# Patient Record
Sex: Male | Born: 1990 | Race: White | Hispanic: No | Marital: Single | Smoking: Never smoker
Health system: Southern US, Community
[De-identification: ages and names within clinical notes are randomized; demographics above are authoritative.]

## PROBLEM LIST (undated history)

## (undated) DIAGNOSIS — F988 Other specified behavioral and emotional disorders with onset usually occurring in childhood and adolescence: Secondary | ICD-10-CM

## (undated) DIAGNOSIS — N62 Hypertrophy of breast: Secondary | ICD-10-CM

## (undated) DIAGNOSIS — F319 Bipolar disorder, unspecified: Secondary | ICD-10-CM

## (undated) DIAGNOSIS — M109 Gout, unspecified: Secondary | ICD-10-CM

## (undated) DIAGNOSIS — M249 Joint derangement, unspecified: Secondary | ICD-10-CM

## (undated) DIAGNOSIS — I7300 Raynaud's syndrome without gangrene: Secondary | ICD-10-CM

## (undated) HISTORY — DX: Joint derangement, unspecified: M24.9

## (undated) HISTORY — DX: Bipolar disorder, unspecified: F31.9

## (undated) HISTORY — PX: WISDOM TOOTH EXTRACTION: SHX21

## (undated) HISTORY — DX: Hypertrophy of breast: N62

## (undated) HISTORY — DX: Other specified behavioral and emotional disorders with onset usually occurring in childhood and adolescence: F98.8

## (undated) HISTORY — DX: Raynaud's syndrome without gangrene: I73.00

## (undated) HISTORY — DX: Gout, unspecified: M10.9

---
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
## 2010-04-01 ENCOUNTER — Encounter: Payer: Self-pay | Admitting: Family Medicine

## 2010-07-27 NOTE — Op Note (Signed)
NAMECARNELL, BEAVERS                 ACCOUNT NO.:  [id number]   MEDICAL RECORD NO.:  [id number]          PATIENT TYPE:  AMB   LOCATION:  DSC                          FACILITY:  [facility]   PHYSICIAN:  Loreta Ave, M.D. DATE OF BIRTH:  September 18, 1990   DATE OF PROCEDURE:  DATE OF DISCHARGE:                                 OPERATIVE REPORT   PREOPERATIVE DIAGNOSIS:  Medial meniscus tear, left knee.   POSTOPERATIVE DIAGNOSES:  1. Medial meniscus tear, left knee.  2. Irreparable complex bucket-handle tear medial meniscus.  3. 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   PROCEDURE:  1. Left knee examined under anesthesia.  2. Arthroscopy.  3. Partial medial meniscectomy.  4. Chondroplasty medial margin of patella.   SURGEON:  Loreta Ave, M.D.   ASSISTANT:  Genene Churn. Denton Meek.   ANESTHESIA:  General.   BLOOD LOSS:  Minimal.   TOURNIQUET:  Not employed.   SPECIMENS:  None.   CULTURES:  None.   COMPLICATIONS:  None.   DRESSING:  Soft compressive.   PROCEDURE:  The patient brought to the operating room and after adequate  anesthesia had been obtained, the knee was examined.  Lacks full extension  by a 5 degrees, soft, flexion contracture consistent with his displaced  bucket-handle tear.  Otherwise, full motion stable ligaments.  Tourniquet  leg holder applied.  Left leg prepped and draped in the usual sterile  fashion.  Three portals created, one superolateral, one each medial and  lateral parapatellar.  Inflow catheter introduced and the knee extended.  Arthroscope introduced, knee inspected.  Displaced bucket-handle tear,  posterior two-thirds medial meniscus displaced forward.  This is away from  the rim so it was white-white.  This was also subacute so that there was  marked intrameniscal tearing.  Thoroughly assessed and repair really not an  option.  This was locked into the notch and I could barely even get it out  of the notch.  After thoroughly  assessing this, the bucket-handle tear was  removed, tapering __________, leaving about 1/3 of the meniscus all the  around to completion.  Articular cartilage looked good.  Cruciate ligaments  intact.  Lateral meniscus and lateral compartment normal.  On the patella  there was a flap, grade 2-3, right at the medial margin, traumatic in  origin, debrided.  Remaining articular cartilage and patellofemoral tracking  look good.  At completion, the entire knee examined, no other findings  appreciated.  Instruments and fluid removed.  Portals in knee injected with  Marcaine.  Portals closed with 4-0 nylon.  Sterile compressed dressing  applied.  Anesthesia reversed.  Brought to the recovery room.  Tolerated  surgery well, no complications.      Loreta Ave, M.D.  Electronically Signed     DFM/MEDQ  D:  11/27/2005  T:  11/27/2005  Job:  [id number]

## 2010-07-31 ENCOUNTER — Encounter: Payer: Self-pay | Admitting: *Deleted

## 2010-11-07 ENCOUNTER — Other Ambulatory Visit: Payer: Self-pay | Admitting: Family Medicine

## 2010-11-07 DIAGNOSIS — N63 Unspecified lump in unspecified breast: Secondary | ICD-10-CM

## 2010-11-07 DIAGNOSIS — N644 Mastodynia: Secondary | ICD-10-CM

## 2010-11-14 ENCOUNTER — Ambulatory Visit
Admission: RE | Admit: 2010-11-14 | Discharge: 2010-11-14 | Disposition: A | Discharge status: Home / Self Care | Payer: BC Managed Care – PPO | Source: Ambulatory Visit | Attending: Family Medicine | Admitting: Family Medicine

## 2010-11-14 ENCOUNTER — Other Ambulatory Visit: Payer: Self-pay

## 2010-11-14 DIAGNOSIS — N63 Unspecified lump in unspecified breast: Secondary | ICD-10-CM

## 2010-11-14 DIAGNOSIS — N644 Mastodynia: Secondary | ICD-10-CM

## 2012-03-31 ENCOUNTER — Other Ambulatory Visit: Payer: Self-pay | Admitting: Family Medicine

## 2012-03-31 DIAGNOSIS — N6310 Unspecified lump in the right breast, unspecified quadrant: Secondary | ICD-10-CM

## 2012-04-03 ENCOUNTER — Ambulatory Visit
Admission: RE | Admit: 2012-04-03 | Discharge: 2012-04-03 | Disposition: A | Discharge status: Home / Self Care | Payer: BC Managed Care – PPO | Source: Ambulatory Visit | Attending: Family Medicine | Admitting: Family Medicine

## 2012-04-03 DIAGNOSIS — N6310 Unspecified lump in the right breast, unspecified quadrant: Secondary | ICD-10-CM

## 2012-04-17 ENCOUNTER — Ambulatory Visit (INDEPENDENT_AMBULATORY_CARE_PROVIDER_SITE_OTHER): Payer: BC Managed Care – PPO | Admitting: Surgery

## 2012-04-17 ENCOUNTER — Encounter (INDEPENDENT_AMBULATORY_CARE_PROVIDER_SITE_OTHER): Payer: Self-pay | Admitting: Surgery

## 2012-04-17 VITALS — BP 126/74 | HR 70 | Temp 97.3°F | Resp 18 | Ht 68.0 in | Wt 172.5 lb

## 2012-04-17 DIAGNOSIS — N62 Hypertrophy of breast: Secondary | ICD-10-CM | POA: Insufficient documentation

## 2012-04-17 NOTE — Progress Notes (Signed)
Patient ID: Charles [name], male   DOB: Jul 12, 1990, 22 y.o.   MRN: [id number]  Chief Complaint  Patient presents with  . New Evaluation    Gynecomastia    HPI Rawley [name] is a 22 y.o. male.  Self-referred for gynecomastia PCP - Dr. Rudi Heap HPI This is a 22 year old male who presents with a very tender enlarging right breast mass. The patient has had some gynecomastia for the last couple of years. He underwent ultrasound last year that showed only benign gynecomastia. He states that both sides have become larger but the right side is larger than the left. The right side has also become fairly tender to palpation. The there is a palpable mass within the right breast tissue that is exquisitely tender. He feels some firm masses in the left side as well. He had a repeat ultrasound that was unable to visualize this right breast mass. He denies any headaches or visual changes. No change in the size of his testicles. No other systemic complaints. All of his current medications were started after the gynecomastia was first noticed.  Past Medical History  Diagnosis Date  . Allergic rhinitis   . Gynecomastia, male     Past Surgical History  Procedure Date  . Tonsillectomy 09/16/95  . Knee arthroscopy w/ meniscectomy 11/23/05    left knee - extensive bucket handle tear of medial meniscus  . Shoulder arthroscopy w/ bankhart procedure 08/10/06    Family History  Problem Relation Age of Onset  . Heart attack Maternal Grandfather   . Diabetes Paternal Grandmother   . Diabetes Paternal Grandfather   . Hyperlipidemia Paternal Grandfather   . Hypertension Paternal Grandfather   . Heart disease Paternal Grandfather   . Cancer Maternal Aunt     breast    Social History History  Substance Use Topics  . Smoking status: Never Smoker   . Smokeless tobacco: Never Used  . Alcohol Use: Yes     Comment: occasional    No Known Allergies  Current Outpatient Prescriptions  Medication Sig Dispense  Refill  . amphetamine-dextroamphetamine (ADDERALL) 5 MG tablet Take 5 mg by mouth daily.      Marland Kitchen atomoxetine (STRATTERA) 100 MG capsule Take 100 mg by mouth daily.      . divalproex (DEPAKOTE) 500 MG DR tablet Take 500 mg by mouth daily. Patient unsure of dosage.      . valACYclovir (VALTREX) 1000 MG tablet Take 1,000 mg by mouth 2 (two) times daily. Prn as needed for fever blisters         Review of Systems Review of Systems  Constitutional: Negative for fever, chills and unexpected weight change.  HENT: Negative for hearing loss, congestion, sore throat, trouble swallowing and voice change.   Eyes: Negative for visual disturbance.  Respiratory: Negative for cough and wheezing.   Cardiovascular: Negative for chest pain, palpitations and leg swelling.  Gastrointestinal: Negative for nausea, vomiting, abdominal pain, diarrhea, constipation, blood in stool, abdominal distention, anal bleeding and rectal pain.  Genitourinary: Negative for hematuria and difficulty urinating.  Musculoskeletal: Negative for arthralgias.  Skin: Negative for rash and wound.  Neurological: Negative for seizures, syncope, weakness and headaches.  Hematological: Negative for adenopathy. Does not bruise/bleed easily.  Psychiatric/Behavioral: Negative for confusion.  Painful chest mass  Blood pressure 126/74, pulse 70, temperature 97.3 F (36.3 C), temperature source Temporal, resp. rate 18, height 5\' 8"  (1.727 m), weight 172 lb 8 oz (78.245 kg).  Physical Exam Physical Exam WDWN in NAD HEENT:  EOMI, sclera anicteric Neck:  No masses, no thyromegaly Lungs:  CTA bilaterally; normal respiratory effort Chest:  Bilateral gynecomastia - right greater than left; firm 1 cm mass lateral to right nipple - very tender to palpation.  Small subcm mass lateral to left nipple.  No lymphadenopathy CV:  Regular rate and rhythm; no murmurs Abd:  +bowel sounds, soft, non-tender, no masses Ext:  Well-perfused; no edema Skin:   Warm, dry; no sign of jaundice  Data Reviewed Clinical Data: Increasing size of right breast lump and increasing  tenderness.  RIGHT BREAST ULTRASOUND  Comparison: 11/14/2010  On physical exam, I palpate glandular tissue centered in the  subareolar region of the breast, right greater than left. No  discrete mass is palpable today.  Findings: Ultrasound is performed, showing breast tissue centered  in the subareolar region of the right breast now extending to the  11 o'clock position, 2 cm the nipple, increased from the prior  exam. No suspicious mass is seen today.  IMPRESSION:  Gynecomastia, right breast.  RECOMMENDATION:  No further imaging is warranted at this time.  I have discussed the findings and recommendations with the patient.  Results were also provided in writing at the conclusion of the  visit.  BI-RADS CATEGORY 2: Benign finding(s).  Original Report Authenticated By: Vincenza Hews, M.D.   Assessment    Bilateral gynecomastia Painful enlarging firm right breast mass Firm enlarging left breast masses    Plan    Recommend bilateral subcutaneous mastecomies.  Options include further imaging, including core needle biopsy, but given the exquisite tenderness, would recommend proceeding with surgery.  The surgical procedure has been discussed with the patient.  Potential risks, benefits, alternative treatments, and expected outcomes have been explained.  All of the patient's questions at this time have been answered.  The likelihood of reaching the patient's treatment goal is good.  The patient understand the proposed surgical procedure and wishes to proceed.        Elisandra Deshmukh K. 04/17/2012, 12:52 PM

## 2012-04-17 NOTE — Patient Instructions (Addendum)
Blood work today.  We will go ahead and schedule your surgery.  We will call you with the lab results.  If the labs are normal, we will proceed with surgery.

## 2012-04-21 ENCOUNTER — Telehealth (INDEPENDENT_AMBULATORY_CARE_PROVIDER_SITE_OTHER): Payer: Self-pay | Admitting: General Surgery

## 2012-04-21 ENCOUNTER — Other Ambulatory Visit (INDEPENDENT_AMBULATORY_CARE_PROVIDER_SITE_OTHER): Payer: Self-pay | Admitting: Surgery

## 2012-04-21 LAB — TESTOSTERONE, FREE, TOTAL, SHBG
Sex Hormone Binding: 22 nmol/L (ref 13–71)
Testosterone, Free: 65.5 pg/mL (ref 47.0–244.0)
Testosterone-% Free: 2.4 % (ref 1.6–2.9)
Testosterone: 271 ng/dL — ABNORMAL LOW (ref 300–890)

## 2012-04-21 LAB — BASIC METABOLIC PANEL
BUN: 9 mg/dL (ref 6–23)
CO2: 27 mEq/L (ref 19–32)
Calcium: 9.9 mg/dL (ref 8.4–10.5)
Chloride: 105 mEq/L (ref 96–112)
Creat: 0.95 mg/dL (ref 0.50–1.35)
Glucose, Bld: 89 mg/dL (ref 70–99)
Potassium: 4 mEq/L (ref 3.5–5.3)
Sodium: 142 mEq/L (ref 135–145)

## 2012-04-21 LAB — PROLACTIN: Prolactin: 7.9 ng/mL (ref 2.1–17.1)

## 2012-04-21 LAB — TSH: TSH: 2.428 u[IU]/mL (ref 0.350–4.500)

## 2012-04-21 NOTE — Telephone Encounter (Signed)
LMOM for Charles [name] or Charles [name] to call back and ask for [employer]

## 2012-04-21 NOTE — Telephone Encounter (Signed)
Patient called back and I told him about his lab work and that he will need to call Dr Rudi Heap at [facility] to do more for low testosterone, I also told the patient that I will be faxing over his lab work to Dr Christell Constant and I made him a apt to come back in to see Dr Corliss Skains on 07-10-12 @ 9:00. Patient is aware that I will mail out apt card

## 2012-07-10 ENCOUNTER — Ambulatory Visit (INDEPENDENT_AMBULATORY_CARE_PROVIDER_SITE_OTHER): Payer: BC Managed Care – PPO | Admitting: Surgery

## 2012-07-10 ENCOUNTER — Encounter (INDEPENDENT_AMBULATORY_CARE_PROVIDER_SITE_OTHER): Payer: Self-pay | Admitting: Surgery

## 2012-07-10 VITALS — BP 122/70 | HR 68 | Temp 97.3°F | Resp 14 | Ht 68.0 in | Wt 173.0 lb

## 2012-07-10 DIAGNOSIS — N62 Hypertrophy of breast: Secondary | ICD-10-CM

## 2012-07-10 NOTE — Patient Instructions (Addendum)
If you decide to have surgery, please call my nurse Pattricia Boss at [phone] to let me know your decision.  I will then do the paperwork and our surgery schedulers will call you to schedule surgery.

## 2012-07-10 NOTE — Progress Notes (Signed)
Followup of his bilateral gynecomastia. The patient has not yet started any testosterone supplements. He has had repeat labs which shows a low testosterone level. He states that the right chest feels more tender and slightly larger. He feels that there may be a new mass that is growing. He states that he would prefer to have the surgery to go ahead and take care of this problem.  Filed Vitals:   07/10/12 0950  BP: 122/70  Pulse: 68  Temp: 97.3 F (36.3 C)  Resp: 14   The right breast tissue seems more prominent on the left. Both sides are mildly tender. No sign of infection or inflammation. The right breast tissue but seems to be slightly larger than previous visit. There some firmness just lateral to the upper edge of the nipple. The left breast does show some palpable protruding breast tissue but no hard masses. No sign of infection or inflammation.  Impression bilateral gynecomastia with slightly decreased testosterone level  Plan: I would be willing to perform bilateral subcutaneous mastectomies on this patient. He is having discussions with his mother as well as with his primary care physician regarding testosterone levels and possible testosterone supplementation. One day have discussed this and have come to the decision for surgery will let us know we will call him to schedule surgery. I did discuss the procedure with the patient and he understands that this would be an outpatient surgery but that he would have drains on each side to prevent seroma formation.  Wilmon Arms. Corliss Skains, MD, [facility]  07/10/2012 10:45 AM

## 2012-08-26 ENCOUNTER — Encounter: Payer: Self-pay | Admitting: Physician Assistant

## 2012-08-26 ENCOUNTER — Ambulatory Visit (INDEPENDENT_AMBULATORY_CARE_PROVIDER_SITE_OTHER): Payer: 59 | Admitting: Physician Assistant

## 2012-08-26 VITALS — BP 125/80 | HR 63 | Temp 97.8°F | Ht 68.0 in | Wt 175.0 lb

## 2012-08-26 DIAGNOSIS — J329 Chronic sinusitis, unspecified: Secondary | ICD-10-CM

## 2012-08-26 MED ORDER — AMOXICILLIN-POT CLAVULANATE 875-125 MG PO TABS: 1.0000 | ORAL_TABLET | Freq: Two times a day (BID) | ORAL | Status: DC

## 2012-08-26 NOTE — Patient Instructions (Signed)

## 2012-08-26 NOTE — Progress Notes (Signed)
Subjective:     Patient ID: Charles [name], male   DOB: 07/14/1990, 22 y.o.   MRN: [id number]  HPI Pt with several week hx of head congestin with pain, PND, general malaise, and fatigue Hx of intermit sinusitis in the past No fever/chills + nausea but no vomiting No OTC meds tried  Review of Systems  All other systems reviewed and are negative.       Objective:   Physical Exam  Nursing note and vitals reviewed. + sinus TTP- maxillary/frontal Ears- canals nl bilat, + fluid behind TM's Oral- + PND, no increase in tonsil size No cerv nodes Heart- RRR w/o M Lungs- CTA     Assessment:     1. Sinusitis, chronic        Plan:     Augmentin rx OTC antihist/decongest Fluids Rest F/U prn

## 2012-10-12 ENCOUNTER — Ambulatory Visit (INDEPENDENT_AMBULATORY_CARE_PROVIDER_SITE_OTHER): Payer: 59 | Admitting: General Practice

## 2012-10-12 ENCOUNTER — Encounter: Payer: Self-pay | Admitting: General Practice

## 2012-10-12 VITALS — BP 138/75 | HR 65 | Temp 98.1°F | Ht 67.5 in | Wt 180.0 lb

## 2012-10-12 DIAGNOSIS — N62 Hypertrophy of breast: Secondary | ICD-10-CM

## 2012-10-13 ENCOUNTER — Ambulatory Visit: Payer: 59 | Admitting: General Practice

## 2012-10-13 NOTE — Progress Notes (Signed)
  Subjective:    Patient ID: Charles [name], male    DOB: 07-19-1990, 22 y.o.   MRN: [id number]  HPI Patient presents today for college physical and to discuss gynecomastia. He is accompanied by his mother. Patient reports his breast tissue has increased in size over past two years. His mother reports they have spoken with a Careers adviser and discussed the surgical options. They would now like to have a referral to see an endocrinologist to discuss options. 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 He and his mother denies use of medications in the past that cause this condition. He reports taking adderall and Strattera for past two years.     Review of Systems  Constitutional: Negative for fever and chills.  HENT: Negative for congestion, neck pain and neck stiffness.   Eyes: Negative for pain.  Respiratory: Negative for chest tightness and shortness of breath.   Cardiovascular: Negative for chest pain and palpitations.  Gastrointestinal: Negative for abdominal pain, constipation, blood in stool and rectal pain.  Genitourinary: Negative for dysuria and difficulty urinating.  Musculoskeletal: Negative for back pain.  Neurological: Negative for dizziness, weakness and headaches.       Objective:   Physical Exam  Constitutional: He is oriented to person, place, and time. He appears well-developed and well-nourished.  HENT:  Head: Normocephalic and atraumatic.  Right Ear: External ear normal.  Left Ear: External ear normal.  Mouth/Throat: Oropharynx is clear and moist.  Eyes: Conjunctivae and EOM are normal. Pupils are equal, round, and reactive to light.  Neck: Normal range of motion. Neck supple.  Cardiovascular: Normal rate, regular rhythm and normal heart sounds.   Pulmonary/Chest: Effort normal and breath sounds normal. No respiratory distress. He exhibits no tenderness.  Increase size of breast tissue  Abdominal: Soft. Bowel sounds  are normal. He exhibits no distension. There is no tenderness.  Neurological: He is alert and oriented to person, place, and time.  Skin: Skin is warm and dry.  Psychiatric: He has a normal mood and affect.          Assessment & Plan:  1. Gynecomastia, male - Ambulatory referral to Endocrinology -RTO if symptoms worsen  -Patient verbalized understanding -Coralie Keens, FNP-C

## 2012-10-21 ENCOUNTER — Other Ambulatory Visit: Payer: Self-pay | Admitting: Endocrinology

## 2012-10-21 DIAGNOSIS — E291 Testicular hypofunction: Secondary | ICD-10-CM

## 2012-10-22 ENCOUNTER — Ambulatory Visit
Admission: RE | Admit: 2012-10-22 | Discharge: 2012-10-22 | Disposition: A | Discharge status: Home / Self Care | Payer: 59 | Source: Ambulatory Visit | Attending: Endocrinology | Admitting: Endocrinology

## 2012-10-22 DIAGNOSIS — E291 Testicular hypofunction: Secondary | ICD-10-CM

## 2012-10-22 MED ORDER — GADOBENATE DIMEGLUMINE 529 MG/ML IV SOLN
10.0000 mL | Freq: Once | INTRAVENOUS | Status: AC | PRN
  Administered 2012-10-22: 10 mL via INTRAVENOUS

## 2012-12-28 ENCOUNTER — Ambulatory Visit (INDEPENDENT_AMBULATORY_CARE_PROVIDER_SITE_OTHER): Payer: 59 | Admitting: *Deleted

## 2012-12-31 DIAGNOSIS — Z23 Encounter for immunization: Secondary | ICD-10-CM

## 2013-05-25 ENCOUNTER — Encounter: Payer: Self-pay | Admitting: Family Medicine

## 2013-05-25 ENCOUNTER — Ambulatory Visit (INDEPENDENT_AMBULATORY_CARE_PROVIDER_SITE_OTHER): Payer: 59 | Admitting: Family Medicine

## 2013-05-25 VITALS — BP 136/79 | HR 98 | Temp 98.4°F | Ht 68.0 in | Wt 194.4 lb

## 2013-05-25 DIAGNOSIS — L0292 Furuncle, unspecified: Secondary | ICD-10-CM

## 2013-05-25 DIAGNOSIS — L0293 Carbuncle, unspecified: Secondary | ICD-10-CM

## 2013-05-25 MED ORDER — AMOXICILLIN 875 MG PO TABS: 875.0000 mg | ORAL_TABLET | Freq: Two times a day (BID) | ORAL | Status: DC

## 2013-05-25 NOTE — Progress Notes (Signed)
   Subjective:    Patient ID: Charles [name], male    DOB: [birth year], 23 y.o.   MRN: [id number]  HPI  C/o cyst on base of penis.  Denies any risky sex  Review of Systems    No chest pain, SOB, HA, dizziness, vision change, N/V, diarrhea, constipation, dysuria, urinary urgency or frequency, myalgias, arthralgias or rash.  Objective:   Physical Exam  Vital signs noted  Well developed well nourished male.  HEENT - Head atraumatic Normocephalic                Eyes - PERRLA, Conjuctiva - clear Sclera- Clear EOMI Respiratory - Lungs CTA bilateral Cardiac - RRR S1 and S2 without murmur GU - Base of penis with non fluctuant furuncle.  He has what Appears to be acne scars on the shaft of his penis.      Assessment & Plan:  Carbuncle - Plan: amoxicillin (AMOXIL) 875 MG tablet One po bid x 10 days.  Follow up if not better.  Lysbeth Penner FNP

## 2014-02-07 ENCOUNTER — Ambulatory Visit (INDEPENDENT_AMBULATORY_CARE_PROVIDER_SITE_OTHER): Payer: 59 | Admitting: *Deleted

## 2014-02-07 DIAGNOSIS — Z23 Encounter for immunization: Secondary | ICD-10-CM

## 2014-02-16 ENCOUNTER — Other Ambulatory Visit: Payer: Self-pay | Admitting: Endocrinology

## 2014-02-16 DIAGNOSIS — D352 Benign neoplasm of pituitary gland: Secondary | ICD-10-CM

## 2014-03-01 ENCOUNTER — Encounter: Payer: Self-pay | Admitting: Nurse Practitioner

## 2014-03-01 ENCOUNTER — Ambulatory Visit (INDEPENDENT_AMBULATORY_CARE_PROVIDER_SITE_OTHER): Payer: 59 | Admitting: Nurse Practitioner

## 2014-03-01 ENCOUNTER — Encounter (INDEPENDENT_AMBULATORY_CARE_PROVIDER_SITE_OTHER): Payer: Self-pay

## 2014-03-01 VITALS — BP 125/86 | HR 70 | Temp 97.6°F | Ht 68.0 in | Wt 197.0 lb

## 2014-03-01 DIAGNOSIS — F902 Attention-deficit hyperactivity disorder, combined type: Secondary | ICD-10-CM

## 2014-03-01 DIAGNOSIS — L309 Dermatitis, unspecified: Secondary | ICD-10-CM

## 2014-03-01 MED ORDER — AMPHETAMINE-DEXTROAMPHETAMINE 5 MG PO TABS: 5.0000 mg | ORAL_TABLET | Freq: Every day | ORAL | Status: DC

## 2014-03-01 MED ORDER — CLOTRIMAZOLE-BETAMETHASONE 1-0.05 % EX CREA: 1.0000 "application " | TOPICAL_CREAM | Freq: Two times a day (BID) | CUTANEOUS | Status: DC

## 2014-03-01 NOTE — Patient Instructions (Signed)
Eczema Eczema, also called atopic dermatitis, is a skin disorder that causes inflammation of the skin. It causes a red rash and dry, scaly skin. The skin becomes very itchy. Eczema is generally worse during the cooler winter months and often improves with the warmth of summer. Eczema usually starts showing signs in infancy. Some children outgrow eczema, but it may last through adulthood.  CAUSES  The exact cause of eczema is not known, but it appears to run in families. People with eczema often have a family history of eczema, allergies, asthma, or hay fever. Eczema is not contagious. Flare-ups of the condition may be caused by:   Contact with something you are sensitive or allergic to.   Stress. SIGNS AND SYMPTOMS  Dry, scaly skin.   Red, itchy rash.   Itchiness. This may occur before the skin rash and may be very intense.  DIAGNOSIS  The diagnosis of eczema is usually made based on symptoms and medical history. TREATMENT  Eczema cannot be cured, but symptoms usually can be controlled with treatment and other strategies. A treatment plan might include:  Controlling the itching and scratching.   Use over-the-counter antihistamines as directed for itching. This is especially useful at night when the itching tends to be worse.   Use over-the-counter steroid creams as directed for itching.   Avoid scratching. Scratching makes the rash and itching worse. It may also result in a skin infection (impetigo) due to a break in the skin caused by scratching.   Keeping the skin well moisturized with creams every day. This will seal in moisture and help prevent dryness. Lotions that contain alcohol and water should be avoided because they can dry the skin.   Limiting exposure to things that you are sensitive or allergic to (allergens).   Recognizing situations that cause stress.   Developing a plan to manage stress.  HOME CARE INSTRUCTIONS   Only take over-the-counter or  prescription medicines as directed by your health care provider.   Do not use anything on the skin without checking with your health care provider.   Keep baths or showers short (5 minutes) in warm (not hot) water. Use mild cleansers for bathing. These should be unscented. You may add nonperfumed bath oil to the bath water. It is best to avoid soap and bubble bath.   Immediately after a bath or shower, when the skin is still damp, apply a moisturizing ointment to the entire body. This ointment should be a petroleum ointment. This will seal in moisture and help prevent dryness. The thicker the ointment, the better. These should be unscented.   Keep fingernails cut short. Children with eczema may need to wear soft gloves or mittens at night after applying an ointment.   Dress in clothes made of cotton or cotton blends. Dress lightly, because heat increases itching.   A child with eczema should stay away from anyone with fever blisters or cold sores. The virus that causes fever blisters (herpes simplex) can cause a serious skin infection in children with eczema. SEEK MEDICAL CARE IF:   Your itching interferes with sleep.   Your rash gets worse or is not better within 1 week after starting treatment.   You see pus or soft yellow scabs in the rash area.   You have a fever.   You have a rash flare-up after contact with someone who has fever blisters.  Document Released: 02/23/2000 Document Revised: 12/16/2012 Document Reviewed: 09/28/2012 Exit[employer] Patient Information 2015 [employer]. This information   is not intended to replace advice given to you by your health care provider. Make sure you discuss any questions you have with your health care provider.  

## 2014-03-01 NOTE — Progress Notes (Signed)
   Subjective:    Patient ID: Charles [name], male    DOB: Jun 22, 1990, 23 y.o.   MRN: [id number]  HPI Patient in today with 2 complaints: - red place on testicular area- noticed it 2 weeks ago- no change since he noticed- non tender. - Has dx of ADHD- currently on adderall 5mg  daily- wants to start getting meds [facility]- He is doing well on current dose without side effects.    Review of Systems  Constitutional: Negative.   HENT: Negative.   Respiratory: Negative.   Gastrointestinal: Negative.   Neurological: Negative.   Psychiatric/Behavioral: Negative.   All other systems reviewed and are negative.      Objective:   Physical Exam  Constitutional: He is oriented to person, place, and time. He appears well-developed and well-nourished.  Cardiovascular: Normal rate and normal heart sounds.   Pulmonary/Chest: Effort normal and breath sounds normal.  Neurological: He is alert and oriented to person, place, and time.  Skin: Skin is warm and dry.  2cm annular erythematous scaly macular lesion on undersurface of right testicle.  Psychiatric: He has a normal mood and affect. His behavior is normal. Judgment and thought content normal.   BP 125/86 mmHg  Pulse 70  Temp(Src) 97.6 F (36.4 C) (Oral)  Ht 5\' 8"  (1.727 m)  Wt 197 lb (89.359 kg)  BMI 29.96 kg/m2        Assessment & Plan:  1. Eczema RTO if not improving - clotrimazole-betamethasone (LOTRISONE) cream; Apply 1 application topically 2 (two) times daily.  Dispense: 30 g; Refill: 0  2. Attention deficit hyperactivity disorder (ADHD), combined type Take meds as directed Follow up in 3 months - amphetamine-dextroamphetamine (ADDERALL) 5 MG tablet; Take 1 tablet by mouth daily.  Dispense: 30 tablet; Refill: 0 - amphetamine-dextroamphetamine (ADDERALL) 5 MG tablet; Take 1 tablet by mouth daily.  Dispense: 30 tablet; Refill: 0- DO NOT FILL TILL 03/31/14 - amphetamine-dextroamphetamine (ADDERALL) 5 MG  tablet; Take 1 tablet by mouth daily.  Dispense: 30 tablet; Refill: 0- DO  NOT FILL TILL 05/01/14  Mary-Margaret Hassell Done, FNP

## 2014-03-15 ENCOUNTER — Ambulatory Visit
Admission: RE | Admit: 2014-03-15 | Discharge: 2014-03-15 | Disposition: A | Discharge status: Home / Self Care | Payer: 59 | Source: Ambulatory Visit | Attending: Endocrinology | Admitting: Endocrinology

## 2014-03-15 DIAGNOSIS — D352 Benign neoplasm of pituitary gland: Secondary | ICD-10-CM

## 2014-03-15 MED ORDER — GADOBENATE DIMEGLUMINE 529 MG/ML IV SOLN
9.0000 mL | Freq: Once | INTRAVENOUS | Status: AC | PRN
  Administered 2014-03-15: 9 mL via INTRAVENOUS

## 2014-04-08 ENCOUNTER — Telehealth: Payer: Self-pay | Admitting: *Deleted

## 2014-04-08 NOTE — Telephone Encounter (Signed)
Patient had a repeat MRI for pituitary cyst ordered by endo Dr. Wilson Singer. Cyst is resolved but radiologist noticed cervical lymphoadenopathy. Dr. Wilson Singer suggested his PCP address this issue. Can you please look at the MRI results and notice Charles [name], Kongmeng Santoro if anything needs to be done regarding this.

## 2014-04-08 NOTE — Telephone Encounter (Signed)
Can you feel any knots in neck- if can lets wait 1 week and see if resolves on their own

## 2014-04-11 NOTE — Telephone Encounter (Signed)
Discussed with patient's mother because he is away at college. She will have him palpate the area today and she will check the area when he comes home this weekend.

## 2014-04-18 ENCOUNTER — Ambulatory Visit (INDEPENDENT_AMBULATORY_CARE_PROVIDER_SITE_OTHER): Payer: Self-pay | Admitting: Surgery

## 2014-04-18 NOTE — H&P (Signed)
History of Present Illness Charles [name]. Charles Elsberry MD; 04/18/2014 2:27 PM) Patient words: gyneocomastia.  The patient is a 24 year old male who presents with gynecomastia. Referred by Dr. Redge Gainer and Dr. Wilson Singer for evaluation of bilateral gynecomastia.  This is a 24 yo male who was previously evaluated for bilateral gynecomastia in 2014. This has been present for several years. Previous ultrasound showed benign gynecomastia with the right side larger than the left. Both sides are quite tender to palpation. His work-up included a MRI of the brain that showed a 3 mm pituitary adenoma. Repeat MRI last month showed that this pituitary adenoma has resolved. He has been followed by Dr. Wilson Singer for low testosterone levels, but has not yet started any supplements. He has a family history of breast cancer in his maternal aunt, as well as other family members. Because of the tenderness, the enlargement of the gynecomastia, the history of pituitary adenoma, the family history of breast cancer, and the hypogonadism, he returns to be evaluated for possible mastectomies.  Clinical Data: Hypogonadism MRI HEAD WITHOUT AND WITH CONTRAST Technique: Multiplanar, multiecho pulse sequences of the brain and surrounding structures were obtained according to standard protocol without and with intravenous contrast Contrast: 71mL MULTIHANCE GADOBENATE DIMEGLUMINE 529 MG/ML IV SOLN Comparison: None. Findings: Dynamic pituitary protocol was performed Hypoenhancing pituitary nodule is present in the inferior gland in the midline. This depresses the floor of the sella. This nodule measures approximately 3 x 3 mm. The infundibulum is not deviated. No compression optic chiasm. The cavernous sinus is normal. The remainder of the brain is normal. Ventricles are normal. Negative for acute or chronic infarct. No hemorrhage or fluid collection. Paranasal sinuses are clear. IMPRESSION: 3 x 3 mm pituitary microadenoma.  Otherwise negative. Original Report Authenticated By: Carl Best, M.D.    EXAM: MRI HEAD WITHOUT AND WITH CONTRAST TECHNIQUE: Multiplanar, multiecho pulse sequences of the brain and surrounding structures were obtained without and with intravenous contrast. CONTRAST: 7mL MULTIHANCE GADOBENATE DIMEGLUMINE 529 MG/ML IV SOLN COMPARISON: 10/22/2012. FINDINGS: No evidence for acute infarction, hemorrhage, mass lesion, hydrocephalus, or extra-axial fluid. There is no atrophy or white matter disease. Flow voids are maintained throughout the carotid, basilar, and vertebral arteries. There are no areas of chronic hemorrhage. Pineal, and cerebellar tonsils unremarkable. No upper cervical lesions. Post infusion, no abnormal enhancement of the brain or meninges. Thin-section imaging was performed through the sella turcica during dynamic imaging. The previously identified 3 mm area of differential enhancement in the midline inferior gland is not appreciated on today's exam. There is no visible depression of the floor of the sella. Pituitary stalk enhances normally and is in the midline. Normal enhancement pattern of the gland. Gland height is normal. Suprasellar cistern is filled with CSF. There is no cavernous sinus lesion. There is noted significant sinus or mastoid disease. Negative orbits. Mild cervical lymphadenopathy is incompletely evaluated on this MR brain exam. IMPRESSION: The previously identified 3 mm area of differential enhancement in the midline pituitary is no longer visualized. MRI examination of the brain is negative. No cause for hypogonadism is observed. Electronically Signed By: Rolla Flatten M.D. On: 03/15/2014 14:09  Other Problems (Charles [name], CMA; 04/18/2014 10:21 AM) Anxiety Disorder Depression Lump In Breast Other disease, cancer, significant illness  Past Surgical History Charles [name], Charles [name]; 04/18/2014 10:21 AM) Knee Surgery Left. Oral  Surgery Shoulder Surgery Right. Tonsillectomy  Diagnostic Studies History Charles [name], CMA; 04/18/2014 10:21 AM) Colonoscopy never  Allergies (Charles [name], CMA; 04/18/2014 10:25 AM) No  Known Drug Allergies 04/18/2014  Medication History (Charles [name], CMA; 04/18/2014 10:25 AM) Citalopram Hydrobromide (20MG  Tablet, Oral) Active. Amphetamine-Dextroamphetamine (5MG  Tablet, Oral) Active.  Social History Charles [name], [place]; 04/18/2014 10:21 AM) Alcohol use Occasional alcohol use. Caffeine use Carbonated beverages. No drug use  Family History Charles [name], CMA; 04/18/2014 10:21 AM) Breast Cancer Family Members In General. Depression Mother. Diabetes Mellitus Family Members In General. Heart disease in male family member before age 93 Hypertension Father. Melanoma Family Members In General.     Review of Systems Charles [name] CMA; 04/18/2014 10:21 AM) General Present- Fatigue and Weight Gain. Not Present- Appetite Loss, Chills, Fever, Night Sweats and Weight Loss. Skin Not Present- Change in Wart/Mole, Dryness, Hives, Jaundice, New Lesions, Non-Healing Wounds, Rash and Ulcer. HEENT Present- Seasonal Allergies and Sinus Pain. Not Present- Earache, Hearing Loss, Hoarseness, Nose Bleed, Oral Ulcers, Ringing in the Ears, Sore Throat, Visual Disturbances, Wears glasses/contact lenses and Yellow Eyes. Respiratory Present- Snoring. Not Present- Bloody sputum, Chronic Cough, Difficulty Breathing and Wheezing. Breast Present- Breast Mass. Not Present- Breast Pain, Nipple Discharge and Skin Changes. Cardiovascular Not Present- Chest Pain, Difficulty Breathing Lying Down, Leg Cramps, Palpitations, Rapid Heart Rate, Shortness of Breath and Swelling of Extremities. Gastrointestinal Not Present- Abdominal Pain, Bloating, Bloody Stool, Change in Bowel Habits, Chronic diarrhea, Constipation, Difficulty Swallowing, Excessive gas, Gets full quickly at meals, Hemorrhoids, Indigestion, Nausea, Rectal  Pain and Vomiting. Male Genitourinary Present- Urine Leakage. Not Present- Blood in Urine, Change in Urinary Stream, Frequency, Impotence, Nocturia, Painful Urination and Urgency. Musculoskeletal Not Present- Back Pain, Joint Pain, Joint Stiffness, Muscle Pain, Muscle Weakness and Swelling of Extremities. Neurological Present- Decreased Memory. Not Present- Fainting, Headaches, Numbness, Seizures, Tingling, Tremor, Trouble walking and Weakness. Psychiatric Present- Anxiety, Bipolar, Change in Sleep Pattern and Depression. Not Present- Fearful and Frequent crying.  Vitals (Charles [name] CMA; 04/18/2014 10:24 AM) 04/18/2014 10:21 AM Weight: 184 lb Height: 68in Body Surface Area: 2 m Body Mass Index: 27.98 kg/m Temp.: 11F(Temporal)  Pulse: 77 (Regular)  BP: 124/74 (Sitting, Left Arm, Standard)     Physical Exam Rodman Key K. Alontae Chaloux MD; 04/18/2014 2:23 PM)  The physical exam findings are as follows: Note:WDWN in NAD HEENT: EOMI, sclera anicteric Neck: No masses, no thyromegaly Lungs: CTA bilaterally; normal respiratory effort Breasts: asymmetric - right side greater than left; both sides with some excess adipose/ breast tissue; firm mass (1 cm) just lateral to the right nipple; small firm masses behind the nipple on the left; no axillary lymphadenopathy CV: Regular rate and rhythm; no murmurs Abd: +bowel sounds, soft, non-tender, no masses Ext: Well-perfused; no edema Skin: Warm, dry; no sign of jaundice    Assessment & Plan Rodman Key K. 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 The surgical procedure has been discussed with the patient. Potential risks, benefits, alternative treatments, and expected outcomes have been explained. All of the patient's questions at this time have been answered. The  likelihood of reaching the patient's treatment goal is good. The patient understand the proposed surgical procedure and wishes to proceed. Note:With his enlarging breast size, the breast tenderness, the history of pituitary adenoma, and the hypogonadism, I feel that he has multiple indications for bilateral subcutaneous mastectomies. Also, he does have family history of breast cancer, which raises some concerns.   Charles [name]. Georgette Dover, MD, [facility]  General/ Trauma Surgery  04/18/2014 2:27 PM

## 2014-05-12 ENCOUNTER — Other Ambulatory Visit (HOSPITAL_COMMUNITY): Payer: No Typology Code available for payment source

## 2014-05-19 ENCOUNTER — Ambulatory Visit (HOSPITAL_COMMUNITY): Admission: RE | Admit: 2014-05-19 | Payer: 59 | Source: Ambulatory Visit | Admitting: Surgery

## 2014-05-19 ENCOUNTER — Encounter (HOSPITAL_COMMUNITY): Admission: RE | Payer: Self-pay | Source: Ambulatory Visit

## 2014-05-19 SURGERY — MASTECTOMY, FOR GYNECOMASTIA
Anesthesia: General | Laterality: Bilateral

## 2014-06-20 ENCOUNTER — Telehealth: Payer: Self-pay | Admitting: *Deleted

## 2014-06-20 ENCOUNTER — Other Ambulatory Visit: Payer: Self-pay | Admitting: Nurse Practitioner

## 2014-06-20 DIAGNOSIS — F902 Attention-deficit hyperactivity disorder, combined type: Secondary | ICD-10-CM

## 2014-06-20 MED ORDER — AMPHETAMINE-DEXTROAMPHETAMINE 5 MG PO TABS: 5.0000 mg | ORAL_TABLET | Freq: Every day | ORAL | Status: DC

## 2014-06-20 NOTE — Telephone Encounter (Signed)
Patient is requesting a refill on adderrall please

## 2014-06-20 NOTE — Telephone Encounter (Signed)
Patient notified that rx up front ready to pick up

## 2014-06-20 NOTE — Telephone Encounter (Signed)
adderall rx ready for pick up no more refills without being seen

## 2014-08-03 ENCOUNTER — Encounter: Payer: Self-pay | Admitting: Nurse Practitioner

## 2014-08-03 ENCOUNTER — Ambulatory Visit (INDEPENDENT_AMBULATORY_CARE_PROVIDER_SITE_OTHER): Payer: 59 | Admitting: Nurse Practitioner

## 2014-08-03 VITALS — BP 132/88 | HR 69 | Temp 98.5°F | Wt 176.6 lb

## 2014-08-03 DIAGNOSIS — F32A Depression, unspecified: Secondary | ICD-10-CM

## 2014-08-03 DIAGNOSIS — F329 Major depressive disorder, single episode, unspecified: Secondary | ICD-10-CM | POA: Diagnosis not present

## 2014-08-03 DIAGNOSIS — F902 Attention-deficit hyperactivity disorder, combined type: Secondary | ICD-10-CM

## 2014-08-03 MED ORDER — AMPHETAMINE-DEXTROAMPHETAMINE 5 MG PO TABS: 5.0000 mg | ORAL_TABLET | Freq: Every day | ORAL | Status: DC

## 2014-08-03 MED ORDER — CITALOPRAM HYDROBROMIDE 20 MG PO TABS: 20.0000 mg | ORAL_TABLET | Freq: Every day | ORAL | Status: DC

## 2014-08-03 NOTE — Patient Instructions (Signed)
Stress and Stress Management Stress is a normal reaction to life events. It is what you feel when life demands more than you are used to or more than you can handle. Some stress can be useful. For example, the stress reaction can help you catch the last bus of the day, study for a test, or meet a deadline at work. But stress that occurs too often or for too long can cause problems. It can affect your emotional health and interfere with relationships and normal daily activities. Too much stress can weaken your immune system and increase your risk for physical illness. If you already have a medical problem, stress can make it worse. CAUSES  All sorts of life events may cause stress. An event that causes stress for one person may not be stressful for another person. Major life events commonly cause stress. These may be positive or negative. Examples include losing your job, moving into a new home, getting married, having a baby, or losing a loved one. Less obvious life events may also cause stress, especially if they occur day after day or in combination. Examples include working long hours, driving in traffic, caring for children, being in debt, or being in a difficult relationship. SIGNS AND SYMPTOMS Stress may cause emotional symptoms including, the following:  Anxiety. This is feeling worried, afraid, on edge, overwhelmed, or out of control.  Anger. This is feeling irritated or impatient.  Depression. This is feeling sad, down, helpless, or guilty.  Difficulty focusing, remembering, or making decisions. Stress may cause physical symptoms, including the following:   Aches and pains. These may affect your head, neck, back, stomach, or other areas of your body.  Tight muscles or clenched jaw.  Low energy or trouble sleeping. Stress may cause unhealthy behaviors, including the following:   Eating to feel better (overeating) or skipping meals.  Sleeping too little, too much, or both.  Working  too much or putting off tasks (procrastination).  Smoking, drinking alcohol, or using drugs to feel better. DIAGNOSIS  Stress is diagnosed through an assessment by your health care provider. Your health care provider will ask questions about your symptoms and any stressful life events.Your health care provider will also ask about your medical history and may order blood tests or other tests. Certain medical conditions and medicine can cause physical symptoms similar to stress. Mental illness can cause emotional symptoms and unhealthy behaviors similar to stress. Your health care provider may refer you to a mental health professional for further evaluation.  TREATMENT  Stress management is the recommended treatment for stress.The goals of stress management are reducing stressful life events and coping with stress in healthy ways.  Techniques for reducing stressful life events include the following:  Stress identification. Self-monitor for stress and identify what causes stress for you. These skills may help you to avoid some stressful events.  Time management. Set your priorities, keep a calendar of events, and learn to say "no." These tools can help you avoid making too many commitments. Techniques for coping with stress include the following:  Rethinking the problem. Try to think realistically about stressful events rather than ignoring them or overreacting. Try to find the positives in a stressful situation rather than focusing on the negatives.  Exercise. Physical exercise can release both physical and emotional tension. The key is to find a form of exercise you enjoy and do it regularly.  Relaxation techniques. These relax the body and mind. Examples include yoga, meditation, tai chi, biofeedback, deep  breathing, progressive muscle relaxation, listening to music, being out in nature, journaling, and other hobbies. Again, the key is to find one or more that you enjoy and can do  regularly.  Healthy lifestyle. Eat a balanced diet, get plenty of sleep, and do not smoke. Avoid using alcohol or drugs to relax.  Strong support network. Spend time with family, friends, or other people you enjoy being around.Express your feelings and talk things over with someone you trust. Counseling or talktherapy with a mental health professional may be helpful if you are having difficulty managing stress on your own. Medicine is typically not recommended for the treatment of stress.Talk to your health care provider if you think you need medicine for symptoms of stress. HOME CARE INSTRUCTIONS  Keep all follow-up visits as directed by your health care provider.  Take all medicines as directed by your health care provider. SEEK MEDICAL CARE IF:  Your symptoms get worse or you start having new symptoms.  You feel overwhelmed by your problems and can no longer manage them on your own. SEEK IMMEDIATE MEDICAL CARE IF:  You feel like hurting yourself or someone else. Document Released: 08/21/2000 Document Revised: 07/12/2013 Document Reviewed: 10/20/2012 ExitCare Patient Information 2015 [employer]. This information is not intended to replace advice given to you by your health care provider. Make sure you discuss any questions you have with your health care provider.  

## 2014-08-03 NOTE — Progress Notes (Signed)
   Subjective:    Patient ID: Charles [name], male    DOB: 1990/12/22, 24 y.o.   MRN: [id number]  HPI Patient here today for follow up: Depression- currently on celexa 20mg  which is working well for him- no c/o side effects ADHD-currently on adderall 5mg  1x oer day- he has one more semester of college and he will be done- no side effects from medication- grades are good.   Review of Systems  Constitutional: Negative.   HENT: Negative.   Respiratory: Negative.   Cardiovascular: Negative.   Genitourinary: Negative.   Neurological: Negative.   Psychiatric/Behavioral: Negative.   All other systems reviewed and are negative.      Objective:   Physical Exam  Constitutional: He is oriented to person, place, and time. He appears well-developed and well-nourished.  Cardiovascular: Normal rate, regular rhythm and normal heart sounds.   Pulmonary/Chest: Effort normal and breath sounds normal.  Neurological: He is alert and oriented to person, place, and time.  Skin: Skin is warm.  Psychiatric: He has a normal mood and affect. His behavior is normal. Judgment and thought content normal.    BP 132/88 mmHg  Pulse 69  Temp(Src) 98.5 F (36.9 C) (Oral)  Wt 176 lb 9.6 oz (80.105 kg)        Assessment & Plan:  1. Attention deficit hyperactivity disorder (ADHD), combined type Stress management - amphetamine-dextroamphetamine (ADDERALL) 5 MG tablet; Take 1 tablet (5 mg total) by mouth daily.  Dispense: 30 tablet; Refill: 0 - amphetamine-dextroamphetamine (ADDERALL) 5 MG tablet; Take 1 tablet (5 mg total) by mouth daily.  Dispense: 30 tablet; Refill: 0 - amphetamine-dextroamphetamine (ADDERALL) 5 MG tablet; Take 1 tablet (5 mg total) by mouth daily.  Dispense: 30 tablet; Refill: 0  2. Depression - citalopram (CELEXA) 20 MG tablet; Take 1 tablet (20 mg total) by mouth daily.  Dispense: 30 tablet; Refill: 5  Follow up in 3 months  [place], FNP

## 2014-10-03 ENCOUNTER — Other Ambulatory Visit: Payer: Self-pay | Admitting: *Deleted

## 2014-10-03 DIAGNOSIS — F329 Major depressive disorder, single episode, unspecified: Secondary | ICD-10-CM

## 2014-10-03 DIAGNOSIS — F32A Depression, unspecified: Secondary | ICD-10-CM

## 2014-10-03 MED ORDER — CITALOPRAM HYDROBROMIDE 20 MG PO TABS: 20.0000 mg | ORAL_TABLET | Freq: Every day | ORAL | Status: DC

## 2014-10-26 ENCOUNTER — Telehealth: Payer: Self-pay | Admitting: *Deleted

## 2014-10-26 MED ORDER — VALACYCLOVIR HCL 1 G PO TABS: 1000.0000 mg | ORAL_TABLET | Freq: Two times a day (BID) | ORAL | Status: DC | PRN

## 2014-10-26 NOTE — Telephone Encounter (Signed)
Patient has a fever blister and would like a refill on valtrex

## 2014-10-26 NOTE — Telephone Encounter (Signed)
Valtrex rx sent to pharmacy.

## 2014-12-23 ENCOUNTER — Ambulatory Visit (INDEPENDENT_AMBULATORY_CARE_PROVIDER_SITE_OTHER): Payer: 59 | Admitting: Nurse Practitioner

## 2014-12-23 ENCOUNTER — Encounter: Payer: Self-pay | Admitting: Nurse Practitioner

## 2014-12-23 VITALS — BP 134/87 | HR 75 | Temp 98.1°F | Ht 68.0 in | Wt 182.0 lb

## 2014-12-23 DIAGNOSIS — F329 Major depressive disorder, single episode, unspecified: Secondary | ICD-10-CM | POA: Diagnosis not present

## 2014-12-23 DIAGNOSIS — F902 Attention-deficit hyperactivity disorder, combined type: Secondary | ICD-10-CM

## 2014-12-23 DIAGNOSIS — Z23 Encounter for immunization: Secondary | ICD-10-CM

## 2014-12-23 DIAGNOSIS — F32A Depression, unspecified: Secondary | ICD-10-CM

## 2014-12-23 MED ORDER — AMPHETAMINE-DEXTROAMPHETAMINE 10 MG PO TABS: 10.0000 mg | ORAL_TABLET | Freq: Two times a day (BID) | ORAL | Status: DC

## 2014-12-23 NOTE — Patient Instructions (Signed)
Stress and Stress Management Stress is a normal reaction to life events. It is what you feel when life demands more than you are used to or more than you can handle. Some stress can be useful. For example, the stress reaction can help you catch the last bus of the day, study for a test, or meet a deadline at work. But stress that occurs too often or for too long can cause problems. It can affect your emotional health and interfere with relationships and normal daily activities. Too much stress can weaken your immune system and increase your risk for physical illness. If you already have a medical problem, stress can make it worse. CAUSES  All sorts of life events may cause stress. An event that causes stress for one person may not be stressful for another person. Major life events commonly cause stress. These may be positive or negative. Examples include losing your job, moving into a new home, getting married, having a baby, or losing a loved one. Less obvious life events may also cause stress, especially if they occur day after day or in combination. Examples include working long hours, driving in traffic, caring for children, being in debt, or being in a difficult relationship. SIGNS AND SYMPTOMS Stress may cause emotional symptoms including, the following:  Anxiety. This is feeling worried, afraid, on edge, overwhelmed, or out of control.  Anger. This is feeling irritated or impatient.  Depression. This is feeling sad, down, helpless, or guilty.  Difficulty focusing, remembering, or making decisions. Stress may cause physical symptoms, including the following:   Aches and pains. These may affect your head, neck, back, stomach, or other areas of your body.  Tight muscles or clenched jaw.  Low energy or trouble sleeping. Stress may cause unhealthy behaviors, including the following:   Eating to feel better (overeating) or skipping meals.  Sleeping too little, too much, or both.  Working  too much or putting off tasks (procrastination).  Smoking, drinking alcohol, or using drugs to feel better. DIAGNOSIS  Stress is diagnosed through an assessment by your health care provider. Your health care provider will ask questions about your symptoms and any stressful life events.Your health care provider will also ask about your medical history and may order blood tests or other tests. Certain medical conditions and medicine can cause physical symptoms similar to stress. Mental illness can cause emotional symptoms and unhealthy behaviors similar to stress. Your health care provider may refer you to a mental health professional for further evaluation.  TREATMENT  Stress management is the recommended treatment for stress.The goals of stress management are reducing stressful life events and coping with stress in healthy ways.  Techniques for reducing stressful life events include the following:  Stress identification. Self-monitor for stress and identify what causes stress for you. These skills may help you to avoid some stressful events.  Time management. Set your priorities, keep a calendar of events, and learn to say "no." These tools can help you avoid making too many commitments. Techniques for coping with stress include the following:  Rethinking the problem. Try to think realistically about stressful events rather than ignoring them or overreacting. Try to find the positives in a stressful situation rather than focusing on the negatives.  Exercise. Physical exercise can release both physical and emotional tension. The key is to find a form of exercise you enjoy and do it regularly.  Relaxation techniques. These relax the body and mind. Examples include yoga, meditation, tai chi, biofeedback, deep  breathing, progressive muscle relaxation, listening to music, being out in nature, journaling, and other hobbies. Again, the key is to find one or more that you enjoy and can do  regularly.  Healthy lifestyle. Eat a balanced diet, get plenty of sleep, and do not smoke. Avoid using alcohol or drugs to relax.  Strong support network. Spend time with family, friends, or other people you enjoy being around.Express your feelings and talk things over with someone you trust. Counseling or talktherapy with a mental health professional may be helpful if you are having difficulty managing stress on your own. Medicine is typically not recommended for the treatment of stress.Talk to your health care provider if you think you need medicine for symptoms of stress. HOME CARE INSTRUCTIONS  Keep all follow-up visits as directed by your health care provider.  Take all medicines as directed by your health care provider. SEEK MEDICAL CARE IF:  Your symptoms get worse or you start having new symptoms.  You feel overwhelmed by your problems and can no longer manage them on your own. SEEK IMMEDIATE MEDICAL CARE IF:  You feel like hurting yourself or someone else.   This information is not intended to replace advice given to you by your health care provider. Make sure you discuss any questions you have with your health care provider.   Document Released: 08/21/2000 Document Revised: 03/18/2014 Document Reviewed: 10/20/2012 Elsevier Interactive Patient Education [employer].

## 2014-12-23 NOTE — Progress Notes (Signed)
   Subjective:    Patient ID: Charles [name], male    DOB: 1990/10/02, 24 y.o.   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 Medicine helps him concentrate, but thinks he needs to go up slightly on dose. He has no side  effects from meds. He is currently on adderall 5mg  daily. He has a history of depression in which he takes celexa for- he says it is working well for him.    Review of Systems  Constitutional: Negative.   HENT: Negative.   Respiratory: Negative.   Cardiovascular: Negative.   Genitourinary: Negative.   Neurological: Negative.   Psychiatric/Behavioral: Negative.   All other systems reviewed and are negative.      Objective:   Physical Exam  Constitutional: He is oriented to person, place, and time. He appears well-developed and well-nourished.  Cardiovascular: Normal rate, regular rhythm and normal heart sounds.   Pulmonary/Chest: Effort normal and breath sounds normal.  Musculoskeletal: Normal range of motion.  Neurological: He is alert and oriented to person, place, and time.  Skin: Skin is warm and dry.  Psychiatric: He has a normal mood and affect. His behavior is normal. Judgment and thought content normal.   BP 134/87 mmHg  Pulse 75  Temp(Src) 98.1 F (36.7 C) (Oral)  Ht 5\' 8"  (1.727 m)  Wt 182 lb (82.555 kg)  BMI 27.68 kg/m2        Assessment & Plan:  1. Encounter for immunization Flu shot  2. Attention deficit hyperactivity disorder (ADHD), combined type Stress management Increased to 10 mg BID Meds ordered this encounter  Medications  . amphetamine-dextroamphetamine (ADDERALL) 10 MG tablet    Sig: Take 1 tablet (10 mg total) by mouth 2 (two) times daily.    Dispense:  60 tablet    Refill:  0    Order Specific Question:  Supervising Provider    Answer:  Chipper Herb [[id number]]  . amphetamine-dextroamphetamine (ADDERALL) 10 MG tablet    Sig: Take 1 tablet (10 mg total) by mouth  2 (two) times daily.    Dispense:  60 tablet    Refill:  0    DO NOT FILL TILL 01/22/15    Order Specific Question:  Supervising Provider    Answer:  Chipper Herb [[id number]]  Follow up in 2-3 months   3. Depression Stress management Continue celexa as rx  Mary-Margaret Hassell Done, FNP

## 2014-12-26 ENCOUNTER — Ambulatory Visit: Payer: 59 | Admitting: Nurse Practitioner

## 2015-07-26 ENCOUNTER — Other Ambulatory Visit: Payer: Self-pay

## 2015-07-26 NOTE — Telephone Encounter (Signed)
Patient wants to restart adderall. Can this be refilled? Last seen 12-13-14

## 2015-07-27 MED ORDER — AMPHETAMINE-DEXTROAMPHETAMINE 10 MG PO TABS: 10.0000 mg | ORAL_TABLET | Freq: Two times a day (BID) | ORAL | Status: DC

## 2015-07-27 NOTE — Telephone Encounter (Signed)
adderall ready to pick up

## 2016-05-21 ENCOUNTER — Encounter: Payer: Self-pay | Admitting: Nurse Practitioner

## 2016-05-21 ENCOUNTER — Ambulatory Visit (INDEPENDENT_AMBULATORY_CARE_PROVIDER_SITE_OTHER): Payer: BLUE CROSS/BLUE SHIELD | Admitting: Nurse Practitioner

## 2016-05-21 VITALS — BP 131/86 | HR 64 | Temp 98.3°F | Ht 68.0 in | Wt 171.0 lb

## 2016-05-21 DIAGNOSIS — F902 Attention-deficit hyperactivity disorder, combined type: Secondary | ICD-10-CM | POA: Diagnosis not present

## 2016-05-21 DIAGNOSIS — E291 Testicular hypofunction: Secondary | ICD-10-CM

## 2016-05-21 DIAGNOSIS — F3341 Major depressive disorder, recurrent, in partial remission: Secondary | ICD-10-CM

## 2016-05-21 MED ORDER — AMPHETAMINE-DEXTROAMPHETAMINE 10 MG PO TABS: 10.0000 mg | ORAL_TABLET | Freq: Every day | ORAL | 0 refills | Status: DC

## 2016-05-21 MED ORDER — CITALOPRAM HYDROBROMIDE 40 MG PO TABS: 40.0000 mg | ORAL_TABLET | Freq: Every day | ORAL | 5 refills | Status: DC

## 2016-05-21 MED ORDER — TESTOSTERONE 50 MG/5GM (1%) TD GEL: 5.0000 g | Freq: Every day | TRANSDERMAL | 5 refills | Status: DC

## 2016-05-21 NOTE — Progress Notes (Signed)
   Subjective:    Patient ID: Charles [name], male    DOB: 08/28/1990, 26 y.o.   MRN: [id number]  HPI: Patient arrives with hx of depression and ADHD. Has taken Celexa, Latuda, and Adderal in the past. Has not taken any medications in 6 months d/t loss of insurance. Patient now has insurance coverage and is interested in starting back his medications.     Review of Systems  Constitutional: Positive for appetite change and fatigue. Negative for activity change.  HENT: Negative.   Eyes: Negative.   Respiratory: Negative.  Negative for stridor.   Cardiovascular: Negative.  Negative for chest pain.  Gastrointestinal: Negative.   Psychiatric/Behavioral: Positive for agitation, decreased concentration and sleep disturbance. Negative for behavioral problems, confusion, dysphoric mood, hallucinations, self-injury and suicidal ideas. The patient is nervous/anxious. The patient is not hyperactive.   All other systems reviewed and are negative.      Objective:   Physical Exam  Constitutional: He is oriented to person, place, and time. He appears well-developed and well-nourished. No distress.  Cardiovascular: Normal rate and regular rhythm.   Pulmonary/Chest: Effort normal and breath sounds normal.  Neurological: He is alert and oriented to person, place, and time. He has normal reflexes.  Skin: Skin is warm.  Psychiatric: He has a normal mood and affect. His behavior is normal. Judgment and thought content normal.   BP 131/86   Pulse 64   Temp 98.3 F (36.8 C) (Oral)   Ht 5\' 8"  (1.727 m)   Wt 171 lb (77.6 kg)   BMI 26.00 kg/m        Assessment & Plan:  1. Attention deficit hyperactivity disorder (ADHD), combined type Stress management encouraged - amphetamine-dextroamphetamine (ADDERALL) 10 MG tablet; Take 1 tablet (10 mg total) by mouth daily with breakfast.  Dispense: 30 tablet; Refill: 0 - amphetamine-dextroamphetamine (ADDERALL) 10 MG tablet; Take 1 tablet (10 mg total) by mouth  daily with breakfast.  Dispense: 30 tablet; Refill: 0 - amphetamine-dextroamphetamine (ADDERALL) 10 MG tablet; Take 1 tablet (10 mg total) by mouth daily with breakfast.  Dispense: 30 tablet; Refill: 0  2. Recurrent major depressive disorder, in partial remission ([place]) Relaxation techniques promoted. Patient is self-scheduling to see a psychologist. Increased celexa from 20 to 40 mg. Will hold Latuda for now and add later if indicated.  - citalopram (CELEXA) 40 MG tablet; Take 1 tablet (40 mg total) by mouth daily.  Dispense: 30 tablet; Refill: 5  3. Hypogonadism male Patient was thinking of switching to IM but changed his mind.  - testosterone (ANDROGEL) 50 MG/5GM (1%) GEL; Place 5 g onto the skin daily.  Dispense: 30 Package; Refill: Bowman, FNP

## 2016-05-21 NOTE — Patient Instructions (Signed)
Stress and Stress Management Stress is a normal reaction to life events. It is what you feel when life demands more than you are used to or more than you can handle. Some stress can be useful. For example, the stress reaction can help you catch the last bus of the day, study for a test, or meet a deadline at work. But stress that occurs too often or for too long can cause problems. It can affect your emotional health and interfere with relationships and normal daily activities. Too much stress can weaken your immune system and increase your risk for physical illness. If you already have a medical problem, stress can make it worse. What are the causes? All sorts of life events may cause stress. An event that causes stress for one person may not be stressful for another person. Major life events commonly cause stress. These may be positive or negative. Examples include losing your job, moving into a new home, getting married, having a baby, or losing a loved one. Less obvious life events may also cause stress, especially if they occur day after day or in combination. Examples include working long hours, driving in traffic, caring for children, being in debt, or being in a difficult relationship. What are the signs or symptoms? Stress may cause emotional symptoms including, the following:  Anxiety. This is feeling worried, afraid, on edge, overwhelmed, or out of control.  Anger. This is feeling irritated or impatient.  Depression. This is feeling sad, down, helpless, or guilty.  Difficulty focusing, remembering, or making decisions. Stress may cause physical symptoms, including the following:  Aches and pains. These may affect your head, neck, back, stomach, or other areas of your body.  Tight muscles or clenched jaw.  Low energy or trouble sleeping. Stress may cause unhealthy behaviors, including the following:  Eating to feel better (overeating) or skipping meals.  Sleeping too little, too  much, or both.  Working too much or putting off tasks (procrastination).  Smoking, drinking alcohol, or using drugs to feel better. How is this diagnosed? Stress is diagnosed through an assessment by your health care provider. Your health care provider will ask questions about your symptoms and any stressful life events.Your health care provider will also ask about your medical history and may order blood tests or other tests. Certain medical conditions and medicine can cause physical symptoms similar to stress. Mental illness can cause emotional symptoms and unhealthy behaviors similar to stress. Your health care provider may refer you to a mental health professional for further evaluation. How is this treated? Stress management is the recommended treatment for stress.The goals of stress management are reducing stressful life events and coping with stress in healthy ways. Techniques for reducing stressful life events include the following:  Stress identification. Self-monitor for stress and identify what causes stress for you. These skills may help you to avoid some stressful events.  Time management. Set your priorities, keep a calendar of events, and learn to say "no." These tools can help you avoid making too many commitments. Techniques for coping with stress include the following:  Rethinking the problem. Try to think realistically about stressful events rather than ignoring them or overreacting. Try to find the positives in a stressful situation rather than focusing on the negatives.  Exercise. Physical exercise can release both physical and emotional tension. The key is to find a form of exercise you enjoy and do it regularly.  Relaxation techniques. These relax the body and mind. Examples include yoga,  meditation, tai chi, biofeedback, deep breathing, progressive muscle relaxation, listening to music, being out in nature, journaling, and other hobbies. Again, the key is to find one or  more that you enjoy and can do regularly.  Healthy lifestyle. Eat a balanced diet, get plenty of sleep, and do not smoke. Avoid using alcohol or drugs to relax.  Strong support network. Spend time with family, friends, or other people you enjoy being around.Express your feelings and talk things over with someone you trust. Counseling or talktherapy with a mental health professional may be helpful if you are having difficulty managing stress on your own. Medicine is typically not recommended for the treatment of stress.Talk to your health care provider if you think you need medicine for symptoms of stress. Follow these instructions at home:  Keep all follow-up visits as directed by your health care provider.  Take all medicines as directed by your health care provider. Contact a health care provider if:  Your symptoms get worse or you start having new symptoms.  You feel overwhelmed by your problems and can no longer manage them on your own. Get help right away if:  You feel like hurting yourself or someone else. This information is not intended to replace advice given to you by your health care provider. Make sure you discuss any questions you have with your health care provider. Document Released: 08/21/2000 Document Revised: 08/03/2015 Document Reviewed: 10/20/2012 Elsevier Interactive Patient Education  2017 [employer].

## 2016-05-24 ENCOUNTER — Other Ambulatory Visit: Payer: Self-pay | Admitting: Nurse Practitioner

## 2016-05-24 MED ORDER — TESTOSTERONE 20.25 MG/1.25GM (1.62%) TD GEL: 1.0000 | Freq: Every day | TRANSDERMAL | 3 refills | Status: DC

## 2016-05-24 NOTE — Progress Notes (Signed)
Rx called to [facility].

## 2016-05-24 NOTE — Progress Notes (Signed)
Please call in testosterone 20.25 apply 1 pack daily 30 day supply with 3 refills

## 2016-07-02 ENCOUNTER — Other Ambulatory Visit: Payer: Self-pay | Admitting: Nurse Practitioner

## 2016-07-02 DIAGNOSIS — F902 Attention-deficit hyperactivity disorder, combined type: Secondary | ICD-10-CM

## 2016-07-02 MED ORDER — AMPHETAMINE-DEXTROAMPHETAMINE 10 MG PO TABS: 10.0000 mg | ORAL_TABLET | Freq: Every day | ORAL | 0 refills | Status: DC

## 2016-07-03 ENCOUNTER — Other Ambulatory Visit: Payer: Self-pay | Admitting: *Deleted

## 2016-07-03 DIAGNOSIS — F3341 Major depressive disorder, recurrent, in partial remission: Secondary | ICD-10-CM

## 2016-07-03 MED ORDER — CITALOPRAM HYDROBROMIDE 40 MG PO TABS: 40.0000 mg | ORAL_TABLET | Freq: Every day | ORAL | 3 refills | Status: DC

## 2016-10-25 ENCOUNTER — Encounter: Payer: Self-pay | Admitting: Nurse Practitioner

## 2016-10-25 ENCOUNTER — Ambulatory Visit (INDEPENDENT_AMBULATORY_CARE_PROVIDER_SITE_OTHER): Payer: BLUE CROSS/BLUE SHIELD | Admitting: Nurse Practitioner

## 2016-10-25 VITALS — BP 132/88 | HR 69 | Temp 97.8°F | Ht 68.0 in | Wt 169.0 lb

## 2016-10-25 DIAGNOSIS — H6523 Chronic serous otitis media, bilateral: Secondary | ICD-10-CM

## 2016-10-25 DIAGNOSIS — F902 Attention-deficit hyperactivity disorder, combined type: Secondary | ICD-10-CM | POA: Diagnosis not present

## 2016-10-25 MED ORDER — AMPHETAMINE-DEXTROAMPHET ER 25 MG PO CP24: 25.0000 mg | ORAL_CAPSULE | ORAL | 0 refills | Status: DC

## 2016-10-25 MED ORDER — TESTOSTERONE 20.25 MG/1.25GM (1.62%) TD GEL
1.0000 | Freq: Every day | TRANSDERMAL | 3 refills | Status: DC
Start: 2016-10-25 — End: 2016-10-30

## 2016-10-25 MED ORDER — TESTOSTERONE 20.25 MG/1.25GM (1.62%) TD GEL: 1.0000 | Freq: Every day | TRANSDERMAL | 3 refills | Status: DC

## 2016-10-25 NOTE — Addendum Note (Signed)
Addended by: Chevis Pretty on: 10/25/2016 05:04 PM   Modules accepted: Orders

## 2016-10-25 NOTE — Progress Notes (Signed)
   Subjective:    Patient ID: Charles [name], male    DOB: 1990/04/19, 26 y.o.   MRN: [id number]  HPI  Patient comes in today for adult ADHD follow up. He is currently on adderall 10mg  daily- works well for the first 4 hours then he crashes. Would like to switch to ER . He is doing well. No side effects from medication. He is c/o tringig in ears and fatigue for 3 days.   Review of Systems  Constitutional: Negative.   HENT: Positive for congestion, ear pain and tinnitus. Negative for sinus pain, sinus pressure, trouble swallowing and voice change.   Respiratory: Negative for cough and shortness of breath.   Cardiovascular: Negative.   Gastrointestinal: Negative.   Genitourinary: Negative.   Neurological: Negative.   Psychiatric/Behavioral: Negative.   All other systems reviewed and are negative.      Objective:   Physical Exam  Constitutional: He is oriented to person, place, and time. He appears well-developed and well-nourished. No distress.  HENT:  Right Ear: Tympanic membrane is not erythematous. A middle ear effusion (clear) is present.  Left Ear: Tympanic membrane is not erythematous. A middle ear effusion (clear) is present.  Nose: Mucosal edema and rhinorrhea present. Right sinus exhibits no maxillary sinus tenderness and no frontal sinus tenderness. Left sinus exhibits no maxillary sinus tenderness and no frontal sinus tenderness.  Mouth/Throat: Uvula is midline, oropharynx is clear and moist and mucous membranes are normal.  Neck: Normal range of motion. Neck supple.  Cardiovascular: Normal rate and regular rhythm.   Pulmonary/Chest: Effort normal and breath sounds normal.  Neurological: He is alert and oriented to person, place, and time.  Skin: Skin is warm.  Psychiatric: He has a normal mood and affect. His behavior is normal. Judgment and thought content normal.   BP 132/88 (BP Location: Left Arm, Patient Position: Sitting, Cuff Size: Normal)   Pulse 69   Temp 97.8 F  (36.6 C) (Oral)   Ht 5\' 8"  (1.727 m)   Wt 169 lb (76.7 kg)   BMI 25.70 kg/m         Assessment & Plan:  1. Attention deficit hyperactivity disorder (ADHD), combined type Stress management Changed from adderall 10mg  - amphetamine-dextroamphetamine (ADDERALL XR) 25 MG 24 hr capsule; Take 1 capsule by mouth every morning.  Dispense: 30 capsule; Refill: 0 - amphetamine-dextroamphetamine (ADDERALL XR) 25 MG 24 hr capsule; Take 1 capsule by mouth every morning.  Dispense: 30 capsule; Refill: 0 - amphetamine-dextroamphetamine (ADDERALL XR) 25 MG 24 hr capsule; Take 1 capsule by mouth every morning.  Dispense: 30 capsule; Refill: 0  2. Bilateral chronic serous otitis media OTC decongestant Force fluids RTO prn  Mary-Margaret Hassell Done, FNP

## 2016-10-25 NOTE — Addendum Note (Signed)
Addended by: Chevis Pretty on: 10/25/2016 05:00 PM   Modules accepted: Orders

## 2016-10-28 ENCOUNTER — Telehealth: Payer: Self-pay | Admitting: *Deleted

## 2016-10-28 NOTE — Telephone Encounter (Signed)
MMM - [facility]'t [facility] the TD GEL  --- can you reorder and do the pump

## 2016-10-30 ENCOUNTER — Other Ambulatory Visit: Payer: Self-pay | Admitting: Nurse Practitioner

## 2016-10-31 MED ORDER — TESTOSTERONE 20.25 MG/1.25GM (1.62%) TD GEL: 1.0000 | Freq: Every day | TRANSDERMAL | 3 refills | Status: DC

## 2017-01-28 ENCOUNTER — Ambulatory Visit (INDEPENDENT_AMBULATORY_CARE_PROVIDER_SITE_OTHER): Payer: BLUE CROSS/BLUE SHIELD | Admitting: Nurse Practitioner

## 2017-01-28 ENCOUNTER — Encounter: Payer: Self-pay | Admitting: Nurse Practitioner

## 2017-01-28 VITALS — BP 129/81 | HR 66 | Temp 97.9°F | Ht 68.0 in | Wt 157.0 lb

## 2017-01-28 DIAGNOSIS — F902 Attention-deficit hyperactivity disorder, combined type: Secondary | ICD-10-CM | POA: Diagnosis not present

## 2017-01-28 DIAGNOSIS — Z23 Encounter for immunization: Secondary | ICD-10-CM | POA: Diagnosis not present

## 2017-01-28 DIAGNOSIS — F3341 Major depressive disorder, recurrent, in partial remission: Secondary | ICD-10-CM | POA: Diagnosis not present

## 2017-01-28 DIAGNOSIS — E291 Testicular hypofunction: Secondary | ICD-10-CM | POA: Insufficient documentation

## 2017-01-28 MED ORDER — TESTOSTERONE 40.5 MG/2.5GM (1.62%) TD GEL: 1.0000 "application " | Freq: Every day | TRANSDERMAL | 5 refills | Status: DC

## 2017-01-28 MED ORDER — AMPHETAMINE-DEXTROAMPHET ER 25 MG PO CP24
25.0000 mg | ORAL_CAPSULE | ORAL | 0 refills | Status: DC
Start: 2017-01-28 — End: 2017-06-06

## 2017-01-28 MED ORDER — AMPHETAMINE-DEXTROAMPHET ER 25 MG PO CP24: 25.0000 mg | ORAL_CAPSULE | ORAL | 0 refills | Status: DC

## 2017-01-28 MED ORDER — CITALOPRAM HYDROBROMIDE 40 MG PO TABS: 40.0000 mg | ORAL_TABLET | Freq: Every day | ORAL | 3 refills | Status: DC

## 2017-01-28 MED ORDER — AMPHETAMINE-DEXTROAMPHET ER 25 MG PO CP24
25.0000 mg | ORAL_CAPSULE | ORAL | 0 refills | Status: DC
Start: 2017-01-28 — End: 2017-01-28

## 2017-01-28 NOTE — Progress Notes (Signed)
Subjective:    Patient ID: Charles [name], male    DOB: Nov 26, 1990, 26 y.o.   MRN: [id number]  HPI Patient comes in today for follow up of adult residual ADHD. He is currently on  adderall xr 25mg  daily. He has lots of anxiety and some depression and is on celexa. He says depression is manageable with celexa, without side effects from medication. He also has low testosterone and he is on supplements which he gets brand name which is to expensive and would like cheaper version. Depression screen [facility] 2/9 01/28/2017 10/25/2016 05/21/2016  Decreased Interest 1 0 1  Down, Depressed, Hopeless 1 0 0  PHQ - 2 Score 2 0 1  Altered sleeping 0 - -  Tired, decreased energy 0 - -  Change in appetite 1 - -  Feeling bad or failure about yourself  1 - -  Trouble concentrating 0 - -  Moving slowly or fidgety/restless 0 - -  Suicidal thoughts 1 - -  PHQ-9 Score 5 - -       Review of Systems  Constitutional: Negative for activity change and appetite change.  HENT: Negative.   Eyes: Negative for pain.  Respiratory: Negative for shortness of breath.   Cardiovascular: Negative for chest pain, palpitations and leg swelling.  Gastrointestinal: Negative for abdominal pain.  Endocrine: Negative for polydipsia.  Genitourinary: Negative.   Skin: Negative for rash.  Neurological: Negative for dizziness, weakness and headaches.  Hematological: Does not bruise/bleed easily.  Psychiatric/Behavioral: Negative.   All other systems reviewed and are negative.      Objective:   Physical Exam  Constitutional: He is oriented to person, place, and time. He appears well-developed and well-nourished.  HENT:  Head: Normocephalic.  Right Ear: External ear normal.  Left Ear: External ear normal.  Nose: Nose normal.  Mouth/Throat: Oropharynx is clear and moist.  Eyes: EOM are normal. Pupils are equal, round, and reactive to light.  Neck: Normal range of motion. Neck supple. No JVD present. No thyromegaly present.    Cardiovascular: Normal rate, regular rhythm, normal heart sounds and intact distal pulses. Exam reveals no gallop and no friction rub.  No murmur heard. Pulmonary/Chest: Effort normal and breath sounds normal. No respiratory distress. He has no wheezes. He has no rales. He exhibits no tenderness.  Abdominal: Soft. Bowel sounds are normal. He exhibits no mass. There is no tenderness.  Genitourinary: Prostate normal and penis normal.  Musculoskeletal: Normal range of motion. He exhibits no edema.  Lymphadenopathy:    He has no cervical adenopathy.  Neurological: He is alert and oriented to person, place, and time. No cranial nerve deficit.  Skin: Skin is warm and dry.  Psychiatric: He has a normal mood and affect. His behavior is normal. Judgment and thought content normal.   BP 129/81   Pulse 66   Temp 97.9 F (36.6 C) (Oral)   Ht 5\' 8"  (1.727 m)   Wt 157 lb (71.2 kg)   BMI 23.87 kg/m       Assessment & Plan:  1. Attention deficit hyperactivity disorder (ADHD), combined type Stress management - amphetamine-dextroamphetamine (ADDERALL XR) 25 MG 24 hr capsule; Take 1 capsule by mouth every morning.  Dispense: 30 capsule; Refill: 0 - amphetamine-dextroamphetamine (ADDERALL XR) 25 MG 24 hr capsule; Take 1 capsule by mouth every morning.  Dispense: 30 capsule; Refill: 0 - amphetamine-dextroamphetamine (ADDERALL XR) 25 MG 24 hr capsule; Take 1 capsule by mouth every morning.  Dispense: 30 capsule; Refill: 0  2. Recurrent major depressive disorder, in partial remission ([place]) Again stress management - citalopram (CELEXA) 40 MG tablet; Take 1 tablet (40 mg total) by mouth daily.  Dispense: 90 tablet; Refill: 3  3. Hypogonadism in male Call if insurance will not cover meds - Testosterone 40.5 MG/2.5GM (1.62%) GEL; Place 1 application onto the skin daily.  Dispense: 2.5 g; Refill: 5    Labs pending Health maintenance reviewed Diet and exercise encouraged Continue all meds Follow up   In 3 months   [place], FNP

## 2017-06-06 ENCOUNTER — Ambulatory Visit (INDEPENDENT_AMBULATORY_CARE_PROVIDER_SITE_OTHER): Payer: BLUE CROSS/BLUE SHIELD | Admitting: Nurse Practitioner

## 2017-06-06 ENCOUNTER — Encounter: Payer: Self-pay | Admitting: Nurse Practitioner

## 2017-06-06 VITALS — BP 128/77 | HR 56 | Temp 97.9°F | Ht 68.0 in | Wt 149.0 lb

## 2017-06-06 DIAGNOSIS — F902 Attention-deficit hyperactivity disorder, combined type: Secondary | ICD-10-CM

## 2017-06-06 DIAGNOSIS — F3341 Major depressive disorder, recurrent, in partial remission: Secondary | ICD-10-CM

## 2017-06-06 DIAGNOSIS — E291 Testicular hypofunction: Secondary | ICD-10-CM | POA: Diagnosis not present

## 2017-06-06 MED ORDER — AMPHETAMINE-DEXTROAMPHET ER 25 MG PO CP24: 25.0000 mg | ORAL_CAPSULE | ORAL | 0 refills | Status: DC

## 2017-06-06 MED ORDER — TESTOSTERONE 40.5 MG/2.5GM (1.62%) TD GEL: 1.0000 "application " | Freq: Every day | TRANSDERMAL | 5 refills | Status: DC

## 2017-06-06 NOTE — Progress Notes (Signed)
Subjective:    Patient ID: Charles [name], male    DOB: [birth year], 27 y.o.   MRN: [id number]  HPI  Charles [name] is here today for follow up of chronic medical problem.  Outpatient Encounter Medications as of 06/06/2017  Medication Sig  . amphetamine-dextroamphetamine (ADDERALL XR) 25 MG 24 hr capsule Take 1 capsule by mouth every morning.  Marland Kitchen amphetamine-dextroamphetamine (ADDERALL XR) 25 MG 24 hr capsule Take 1 capsule by mouth every morning.  Marland Kitchen amphetamine-dextroamphetamine (ADDERALL XR) 25 MG 24 hr capsule Take 1 capsule by mouth every morning.  . citalopram (CELEXA) 40 MG tablet Take 1 tablet (40 mg total) by mouth daily.  . Multiple Vitamin (MULTIVITAMIN WITH MINERALS) TABS tablet Take 1 tablet by mouth daily.  . Testosterone 40.5 MG/2.5GM (1.62%) GEL Place 1 application onto the skin daily.  . valACYclovir (VALTREX) 1000 MG tablet Take 1 tablet (1,000 mg total) by mouth 2 (two) times daily as needed. Prn as needed for fever blisters     1. Hypogonadism in male  patient has been on testosterone for awhile. He does not use every day  2. Attention deficit hyperactivity disorder (ADHD), combined type  He is currently on adderall XR 25mg  daily. He says that he has trouble concentrating in work when he does not take. He does not usually take on weekends. denies any side effects  3. Recurrent major depressive disorder, in partial remission ([place])  He is currently on celexa. Says it is working well for him. Denies any side effects.   Office Visit from 06/06/2017 in [place]  PHQ-2 Total Score  1         New complaints: None today  Social history: lives alone . -  he was sent to [facility] for a work trip     Review of Systems  Constitutional: Negative for activity change and appetite change.  HENT: Negative.   Eyes: Negative for pain.  Respiratory: Negative for shortness of breath.   Cardiovascular: Negative for chest pain, palpitations and leg swelling.    Gastrointestinal: Negative for abdominal pain.  Endocrine: Negative for polydipsia.  Genitourinary: Negative.   Skin: Negative for rash.  Neurological: Negative for dizziness, weakness and headaches.  Hematological: Does not bruise/bleed easily.  Psychiatric/Behavioral: Negative.   All other systems reviewed and are negative.      Objective:   Physical Exam  Constitutional: He is oriented to person, place, and time. He appears well-developed and well-nourished. No distress.  Cardiovascular: Normal rate and regular rhythm.  Pulmonary/Chest: Effort normal and breath sounds normal.  Neurological: He is alert and oriented to person, place, and time.  Skin: Skin is warm.  Psychiatric: He has a normal mood and affect. His behavior is normal. Judgment and thought content normal.   BP 128/77   Pulse (!) 56   Temp 97.9 F (36.6 C) (Oral)   Ht 5\' 8"  (1.727 m)   Wt 149 lb (67.6 kg)   BMI 22.66 kg/m       Assessment & Plan:  1. Hypogonadism in male - Testosterone 40.5 MG/2.5GM (1.62%) GEL; Place 1 application onto the skin daily.  Dispense: 2.5 g; Refill: 5  2. Attention deficit hyperactivity disorder (ADHD), combined type Continue stress management - amphetamine-dextroamphetamine (ADDERALL XR) 25 MG 24 hr capsule; Take 1 capsule by mouth every morning.  Dispense: 30 capsule; Refill: 0 - amphetamine-dextroamphetamine (ADDERALL XR) 25 MG 24 hr capsule; Take 1 capsule by mouth every morning.  Dispense: 30 capsule; Refill: 0 -  amphetamine-dextroamphetamine (ADDERALL XR) 25 MG 24 hr capsule; Take 1 capsule by mouth every morning.  Dispense: 30 capsule; Refill: 0  3. Recurrent major depressive disorder, in partial remission ([place]) Continue celexa    Labs pending Health maintenance reviewed Diet and exercise encouraged Continue all meds Follow up  In 3 months   [place]anco, FNP

## 2017-06-06 NOTE — Addendum Note (Signed)
Addended by: Chevis Pretty on: 06/06/2017 04:58 PM   Modules accepted: Orders

## 2017-06-06 NOTE — Patient Instructions (Signed)
Stress and Stress Management Stress is a normal reaction to life events. It is what you feel when life demands more than you are used to or more than you can handle. Some stress can be useful. For example, the stress reaction can help you catch the last bus of the day, study for a test, or meet a deadline at work. But stress that occurs too often or for too long can cause problems. It can affect your emotional health and interfere with relationships and normal daily activities. Too much stress can weaken your immune system and increase your risk for physical illness. If you already have a medical problem, stress can make it worse. What are the causes? All sorts of life events may cause stress. An event that causes stress for one person may not be stressful for another person. Major life events commonly cause stress. These may be positive or negative. Examples include losing your job, moving into a new home, getting married, having a baby, or losing a loved one. Less obvious life events may also cause stress, especially if they occur day after day or in combination. Examples include working long hours, driving in traffic, caring for children, being in debt, or being in a difficult relationship. What are the signs or symptoms? Stress may cause emotional symptoms including, the following:  Anxiety. This is feeling worried, afraid, on edge, overwhelmed, or out of control.  Anger. This is feeling irritated or impatient.  Depression. This is feeling sad, down, helpless, or guilty.  Difficulty focusing, remembering, or making decisions.  Stress may cause physical symptoms, including the following:  Aches and pains. These may affect your head, neck, back, stomach, or other areas of your body.  Tight muscles or clenched jaw.  Low energy or trouble sleeping.  Stress may cause unhealthy behaviors, including the following:  Eating to feel better (overeating) or skipping meals.  Sleeping too little,  too much, or both.  Working too much or putting off tasks (procrastination).  Smoking, drinking alcohol, or using drugs to feel better.  How is this diagnosed? Stress is diagnosed through an assessment by your health care provider. Your health care provider will ask questions about your symptoms and any stressful life events.Your health care provider will also ask about your medical history and may order blood tests or other tests. Certain medical conditions and medicine can cause physical symptoms similar to stress. Mental illness can cause emotional symptoms and unhealthy behaviors similar to stress. Your health care provider may refer you to a mental health professional for further evaluation. How is this treated? Stress management is the recommended treatment for stress.The goals of stress management are reducing stressful life events and coping with stress in healthy ways. Techniques for reducing stressful life events include the following:  Stress identification. Self-monitor for stress and identify what causes stress for you. These skills may help you to avoid some stressful events.  Time management. Set your priorities, keep a calendar of events, and learn to say "no." These tools can help you avoid making too many commitments.  Techniques for coping with stress include the following:  Rethinking the problem. Try to think realistically about stressful events rather than ignoring them or overreacting. Try to find the positives in a stressful situation rather than focusing on the negatives.  Exercise. Physical exercise can release both physical and emotional tension. The key is to find a form of exercise you enjoy and do it regularly.  Relaxation techniques. These relax the body and  mind. Examples include yoga, meditation, tai chi, biofeedback, deep breathing, progressive muscle relaxation, listening to music, being out in nature, journaling, and other hobbies. Again, the key is to find  one or more that you enjoy and can do regularly.  Healthy lifestyle. Eat a balanced diet, get plenty of sleep, and do not smoke. Avoid using alcohol or drugs to relax.  Strong support network. Spend time with family, friends, or other people you enjoy being around.Express your feelings and talk things over with someone you trust.  Counseling or talktherapy with a mental health professional may be helpful if you are having difficulty managing stress on your own. Medicine is typically not recommended for the treatment of stress.Talk to your health care provider if you think you need medicine for symptoms of stress. Follow these instructions at home:  Keep all follow-up visits as directed by your health care provider.  Take all medicines as directed by your health care provider. Contact a health care provider if:  Your symptoms get worse or you start having new symptoms.  You feel overwhelmed by your problems and can no longer manage them on your own. Get help right away if:  You feel like hurting yourself or someone else. This information is not intended to replace advice given to you by your health care provider. Make sure you discuss any questions you have with your health care provider. Document Released: 08/21/2000 Document Revised: 08/03/2015 Document Reviewed: 10/20/2012 Elsevier Interactive Patient Education  2017 [employer]  

## 2017-07-17 ENCOUNTER — Other Ambulatory Visit: Payer: Self-pay

## 2017-07-17 ENCOUNTER — Other Ambulatory Visit: Payer: Self-pay | Admitting: Nurse Practitioner

## 2017-07-17 DIAGNOSIS — E291 Testicular hypofunction: Secondary | ICD-10-CM

## 2017-07-17 MED ORDER — TESTOSTERONE 50 MG/5GM (1%) TD GEL: 5.0000 g | Freq: Every day | TRANSDERMAL | 3 refills | Status: DC

## 2017-07-17 NOTE — Progress Notes (Signed)
Testosterone 50

## 2017-09-30 ENCOUNTER — Ambulatory Visit (INDEPENDENT_AMBULATORY_CARE_PROVIDER_SITE_OTHER): Payer: BLUE CROSS/BLUE SHIELD | Admitting: Nurse Practitioner

## 2017-09-30 ENCOUNTER — Encounter: Payer: Self-pay | Admitting: Nurse Practitioner

## 2017-09-30 VITALS — BP 134/80 | HR 60 | Temp 99.0°F | Ht 68.0 in | Wt 149.0 lb

## 2017-09-30 DIAGNOSIS — F902 Attention-deficit hyperactivity disorder, combined type: Secondary | ICD-10-CM | POA: Diagnosis not present

## 2017-09-30 DIAGNOSIS — F3341 Major depressive disorder, recurrent, in partial remission: Secondary | ICD-10-CM

## 2017-09-30 DIAGNOSIS — E291 Testicular hypofunction: Secondary | ICD-10-CM | POA: Diagnosis not present

## 2017-09-30 MED ORDER — AMPHETAMINE-DEXTROAMPHET ER 25 MG PO CP24: 25.0000 mg | ORAL_CAPSULE | ORAL | 0 refills | Status: DC

## 2017-09-30 MED ORDER — ARIPIPRAZOLE 20 MG PO TABS: 20.0000 mg | ORAL_TABLET | Freq: Every day | ORAL | 5 refills | Status: DC

## 2017-09-30 MED ORDER — TESTOSTERONE 50 MG/5GM (1%) TD GEL: 5.0000 g | Freq: Every day | TRANSDERMAL | 3 refills | Status: DC

## 2017-09-30 NOTE — Progress Notes (Signed)
   Subjective:    Patient ID: Charles [name], male    DOB: 11-06-1990, 27 y.o.   MRN: [id number]   Chief Complaint: Medical Management of Chronic Issues   HPI Patient Active Problem List   Diagnosis Date Noted  . Recurrent major depressive disorder, in partial remission ([place]) He is currently on celexa and is not working as well as it use to. He use to be on abilfy but he had to stop taking because it was to expensive. He says it worked well for him. 01/28/2017  . Hypogonadism in male Still taking testosterone - helps with fatigue. 01/28/2017  . Attention deficit hyperactivity disorder (ADHD), combined type Patient comes in today for follow up of residual adult ADHD. He is currently on adderall 25mg  xr daily. He is not able to stay under control and concentrate at work without taking medication. He denies any side effects from meds. No problems sleeping.  08/03/2014     Review of Systems  Constitutional: Negative.  Negative for activity change and appetite change.  HENT: Negative.   Eyes: Negative for pain.  Respiratory: Negative.  Negative for shortness of breath.   Cardiovascular: Negative for chest pain, palpitations and leg swelling.  Gastrointestinal: Negative for abdominal pain.  Endocrine: Negative for polydipsia.  Genitourinary: Negative.   Skin: Negative for rash.  Neurological: Negative for dizziness, weakness and headaches.  Hematological: Does not bruise/bleed easily.  Psychiatric/Behavioral: Negative.   All other systems reviewed and are negative.      Objective:   Physical Exam  Constitutional: He is oriented to person, place, and time. He appears well-developed and well-nourished. No distress.  Cardiovascular: Normal rate.  Pulmonary/Chest: Effort normal.  Neurological: He is alert and oriented to person, place, and time.  Skin: Skin is warm.  Psychiatric: He has a normal mood and affect. His behavior is normal. Judgment and thought content normal.  Nursing  note and vitals reviewed.  BP 134/80   Pulse 60   Temp 99 F (37.2 C) (Oral)   Ht 5\' 8"  (1.727 m)   Wt 149 lb (67.6 kg)   BMI 22.66 kg/m       Assessment & Plan:  Charles [name] comes in today with chief complaint of Medical Management of Chronic Issues   Diagnosis and orders addressed:  1. Attention deficit hyperactivity disorder (ADHD), combined type Stress management - amphetamine-dextroamphetamine (ADDERALL XR) 25 MG 24 hr capsule; Take 1 capsule by mouth every morning.  Dispense: 30 capsule; Refill: 0 - amphetamine-dextroamphetamine (ADDERALL XR) 25 MG 24 hr capsule; Take 1 capsule by mouth every morning.  Dispense: 30 capsule; Refill: 0 - amphetamine-dextroamphetamine (ADDERALL XR) 25 MG 24 hr capsule; Take 1 capsule by mouth every morning.  Dispense: 30 capsule; Refill: 0  2. Hypogonadism in male - testosterone (ANDROGEL) 50 MG/5GM (1%) GEL; Place 5 g onto the skin daily.  Dispense: 30 Package; Refill: 3  3. Recurrent major depressive disorder, in partial remission ([place]) Added abilify to celexa daily Side effects reviewed - ARIPiprazole (ABILIFY) 20 MG tablet; Take 1 tablet (20 mg total) by mouth daily.  Dispense: 30 tablet; Refill: 5   Labs pending Health Maintenance reviewed Diet and exercise encouraged  Follow up plan: 3 months   Mary-Margaret Hassell Done, FNP

## 2017-09-30 NOTE — Patient Instructions (Signed)
Stress and Stress Management Stress is a normal reaction to life events. It is what you feel when life demands more than you are used to or more than you can handle. Some stress can be useful. For example, the stress reaction can help you catch the last bus of the day, study for a test, or meet a deadline at work. But stress that occurs too often or for too long can cause problems. It can affect your emotional health and interfere with relationships and normal daily activities. Too much stress can weaken your immune system and increase your risk for physical illness. If you already have a medical problem, stress can make it worse. What are the causes? All sorts of life events may cause stress. An event that causes stress for one person may not be stressful for another person. Major life events commonly cause stress. These may be positive or negative. Examples include losing your job, moving into a new home, getting married, having a baby, or losing a loved one. Less obvious life events may also cause stress, especially if they occur day after day or in combination. Examples include working long hours, driving in traffic, caring for children, being in debt, or being in a difficult relationship. What are the signs or symptoms? Stress may cause emotional symptoms including, the following:  Anxiety. This is feeling worried, afraid, on edge, overwhelmed, or out of control.  Anger. This is feeling irritated or impatient.  Depression. This is feeling sad, down, helpless, or guilty.  Difficulty focusing, remembering, or making decisions.  Stress may cause physical symptoms, including the following:  Aches and pains. These may affect your head, neck, back, stomach, or other areas of your body.  Tight muscles or clenched jaw.  Low energy or trouble sleeping.  Stress may cause unhealthy behaviors, including the following:  Eating to feel better (overeating) or skipping meals.  Sleeping too little,  too much, or both.  Working too much or putting off tasks (procrastination).  Smoking, drinking alcohol, or using drugs to feel better.  How is this diagnosed? Stress is diagnosed through an assessment by your health care provider. Your health care provider will ask questions about your symptoms and any stressful life events.Your health care provider will also ask about your medical history and may order blood tests or other tests. Certain medical conditions and medicine can cause physical symptoms similar to stress. Mental illness can cause emotional symptoms and unhealthy behaviors similar to stress. Your health care provider may refer you to a mental health professional for further evaluation. How is this treated? Stress management is the recommended treatment for stress.The goals of stress management are reducing stressful life events and coping with stress in healthy ways. Techniques for reducing stressful life events include the following:  Stress identification. Self-monitor for stress and identify what causes stress for you. These skills may help you to avoid some stressful events.  Time management. Set your priorities, keep a calendar of events, and learn to say "no." These tools can help you avoid making too many commitments.  Techniques for coping with stress include the following:  Rethinking the problem. Try to think realistically about stressful events rather than ignoring them or overreacting. Try to find the positives in a stressful situation rather than focusing on the negatives.  Exercise. Physical exercise can release both physical and emotional tension. The key is to find a form of exercise you enjoy and do it regularly.  Relaxation techniques. These relax the body and  mind. Examples include yoga, meditation, tai chi, biofeedback, deep breathing, progressive muscle relaxation, listening to music, being out in nature, journaling, and other hobbies. Again, the key is to find  one or more that you enjoy and can do regularly.  Healthy lifestyle. Eat a balanced diet, get plenty of sleep, and do not smoke. Avoid using alcohol or drugs to relax.  Strong support network. Spend time with family, friends, or other people you enjoy being around.Express your feelings and talk things over with someone you trust.  Counseling or talktherapy with a mental health professional may be helpful if you are having difficulty managing stress on your own. Medicine is typically not recommended for the treatment of stress.Talk to your health care provider if you think you need medicine for symptoms of stress. Follow these instructions at home:  Keep all follow-up visits as directed by your health care provider.  Take all medicines as directed by your health care provider. Contact a health care provider if:  Your symptoms get worse or you start having new symptoms.  You feel overwhelmed by your problems and can no longer manage them on your own. Get help right away if:  You feel like hurting yourself or someone else. This information is not intended to replace advice given to you by your health care provider. Make sure you discuss any questions you have with your health care provider. Document Released: 08/21/2000 Document Revised: 08/03/2015 Document Reviewed: 10/20/2012 Elsevier Interactive Patient Education  2017 [employer]  

## 2017-10-03 ENCOUNTER — Other Ambulatory Visit: Payer: Self-pay

## 2017-10-03 ENCOUNTER — Emergency Department (HOSPITAL_COMMUNITY)
Admission: EM | Admit: 2017-10-03 | Discharge: 2017-10-04 | Disposition: A | Discharge status: Home / Self Care | Payer: BLUE CROSS/BLUE SHIELD | Attending: Emergency Medicine | Admitting: Emergency Medicine

## 2017-10-03 ENCOUNTER — Encounter (HOSPITAL_COMMUNITY): Payer: Self-pay

## 2017-10-03 DIAGNOSIS — R220 Localized swelling, mass and lump, head: Secondary | ICD-10-CM | POA: Diagnosis present

## 2017-10-03 DIAGNOSIS — T7840XA Allergy, unspecified, initial encounter: Secondary | ICD-10-CM | POA: Insufficient documentation

## 2017-10-03 DIAGNOSIS — Z79899 Other long term (current) drug therapy: Secondary | ICD-10-CM | POA: Insufficient documentation

## 2017-10-03 MED ORDER — DIPHENHYDRAMINE HCL 50 MG/ML IJ SOLN
25.0000 mg | Freq: Once | INTRAMUSCULAR | Status: AC
  Administered 2017-10-03: 25 mg via INTRAVENOUS
  Filled 2017-10-03: qty 1

## 2017-10-03 MED ORDER — EPINEPHRINE 0.3 MG/0.3ML IJ SOAJ
INTRAMUSCULAR | Status: AC
  Filled 2017-10-03: qty 0.3

## 2017-10-03 MED ORDER — METHYLPREDNISOLONE SODIUM SUCC 125 MG IJ SOLR
125.0000 mg | Freq: Once | INTRAMUSCULAR | Status: AC
  Administered 2017-10-03: 125 mg via INTRAVENOUS
  Filled 2017-10-03: qty 2

## 2017-10-03 MED ORDER — PREDNISONE 10 MG PO TABS: 40.0000 mg | ORAL_TABLET | Freq: Every day | ORAL | 0 refills | Status: AC

## 2017-10-03 MED ORDER — EPINEPHRINE 0.3 MG/0.3ML IJ SOAJ: 0.3000 mg | Freq: Once | INTRAMUSCULAR | 1 refills | Status: AC | PRN

## 2017-10-03 MED ORDER — DIPHENHYDRAMINE HCL 25 MG PO TABS: 25.0000 mg | ORAL_TABLET | Freq: Four times a day (QID) | ORAL | Status: AC | PRN

## 2017-10-03 MED ORDER — EPINEPHRINE 0.3 MG/0.3ML IJ SOAJ
0.3000 mg | Freq: Once | INTRAMUSCULAR | Status: AC
  Administered 2017-10-03: 0.3 mg via INTRAMUSCULAR

## 2017-10-03 MED ORDER — FAMOTIDINE IN NACL 20-0.9 MG/50ML-% IV SOLN
20.0000 mg | Freq: Once | INTRAVENOUS | Status: AC
  Administered 2017-10-03: 20 mg via INTRAVENOUS
  Filled 2017-10-03: qty 50

## 2017-10-03 NOTE — ED Notes (Signed)
Pt resting with eyes closed at this time.

## 2017-10-03 NOTE — ED Notes (Signed)
Went to shut off IV pump that was beeping, pt states "my throat is starting to swell again", reported to Dr. Hazle Nordmann who is at bedside pt now reports that he has a metallic taste in his mouth. Pt shows no signs of distress, denies SOB, chest pain, difficulty breathing.

## 2017-10-03 NOTE — ED Provider Notes (Signed)
Patient placed in Quick Look pathway, seen and evaluated   Chief Complaint: Neck pressure and voice change  HPI: Patient had a [place] nut approximately 45 minutes prior to arrival.  Patient shortly afterwards developed pressure in his neck and a muffled voice.  He has not had similar reactions in the past.  He states that he does not normally talk like this.  He has not had any hives, lightheadedness or syncope, nausea, vomiting, diarrhea or abdominal pain.  No treatments prior to arrival.  No fevers or sore throat.  ROS:  Positive ROS: (+) Neck pressure and voice change  Negative ROS: (-) Hives, vomiting, diarrhea, syncope  Physical Exam:   Gen: No distress  Neuro: Awake and Alert  Skin: Warm    Focused Exam: Neck no obvious swelling, muffled voice, no posturing; Heart RRR, nml S1,S2, no m/r/g; Lungs CTAB without wheezing or stridor; Abd soft, NT, no rebound or guarding; Ext 2+ pedal pulses bilaterally, no edema.  BP (!) 163/105 (BP Location: Right Arm)   Pulse 86   Temp 98.2 F (36.8 C) (Oral)   Resp 16   Ht 5\' 8"  (1.727 m)   Wt 67.6 kg (149 lb)   SpO2 100%   BMI 22.66 kg/m   Plan: History of suspicious for anaphylaxis.  Upper airway appears clear without signs of angioedema, however given voice change in neck pressure will proceed with treatment for anaphylaxis.  Patient to be given an EpiPen, Solu-Medrol, Pepcid, and Benadryl.  Initiation of care has begun. The patient has been counseled on the process, plan, and necessity for staying for the completion/evaluation, and the remainder of the medical screening examination    Charles [name], Charles [name] 10/03/17 2056    Charles Saver, MD 10/03/17 2227

## 2017-10-03 NOTE — ED Notes (Signed)
Pt states "I feel 100% better"

## 2017-10-03 NOTE — ED Provider Notes (Signed)
[facility] EMERGENCY DEPARTMENT Provider Note   CSN: [id number] Arrival date & time: 10/03/17  2042  History   Chief Complaint Chief Complaint  Patient presents with  . Allergic Reaction   HPI  Patient is a 27 year old male with history of ADD and depression presenting to the ED for tongue swelling.  He states he ate a [place] nut about 1 hour prior to arrival and quickly developed onset of sensation of tongue and throat swelling with change in voice.  No dyspnea, inability to swallow, wheezing, pruritus, or other skin changes.  He has never had a similar reaction in the past.  He also started a new medication earlier today- Abilify.  He denies any other recent fever, cough, URI symptoms, vomiting, diarrhea, or other illness.  Past Medical History:  Diagnosis Date  . ADD (attention deficit disorder) f  . Allergic rhinitis   . Gynecomastia, male     Patient Active Problem List   Diagnosis Date Noted  . Recurrent major depressive disorder, in partial remission ([place]) 01/28/2017  . Hypogonadism in male 01/28/2017  . Attention deficit hyperactivity disorder (ADHD), combined type 08/03/2014  . 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 KNEE ARTHROSCOPY W/ MENISCECTOMY  11/23/05   left knee - extensive bucket handle tear of medial meniscus  . SHOULDER ARTHROSCOPY W/ BANKART PROCEDURE  08/10/06  . TONSILLECTOMY  09/16/95       Home Medications    Prior to Admission medications   Medication Sig Start Date End Date Taking? Authorizing Provider  amphetamine-dextroamphetamine (ADDERALL XR) 25 MG 24 hr capsule Take 1 capsule by mouth every morning. Patient taking differently: Take 25 mg by mouth daily.  11/29/17 12/29/17 Yes Martin, Mary-Margaret, FNP  ARIPiprazole (ABILIFY) 20 MG tablet Take 1 tablet (20 mg total) by mouth daily. 09/30/17  Yes Martin, Mary-Margaret, FNP  citalopram (CELEXA) 40 MG tablet Take 1 tablet  (40 mg total) by mouth daily. 01/28/17  Yes Martin, Mary-Margaret, FNP  Multiple Vitamin (MULTIVITAMIN WITH MINERALS) TABS tablet Take 1 tablet by mouth daily.   Yes [provider]  testosterone (ANDROGEL) 50 MG/5GM (1%) GEL Place 5 g onto the skin daily. 09/30/17  Yes Hassell Done, Mary-Margaret, FNP  valACYclovir (VALTREX) 1000 MG tablet Take 1 tablet (1,000 mg total) by mouth 2 (two) times daily as needed. Prn as needed for fever blisters 10/26/14  Yes Hassell Done, Mary-Margaret, FNP  amphetamine-dextroamphetamine (ADDERALL XR) 25 MG 24 hr capsule Take 1 capsule by mouth every morning. Patient not taking: Reported on 10/03/2017 10/30/17 11/29/17  Chevis Pretty, FNP  amphetamine-dextroamphetamine (ADDERALL XR) 25 MG 24 hr capsule Take 1 capsule by mouth every morning. Patient not taking: Reported on 10/03/2017 09/30/17 10/30/17  Chevis Pretty, FNP  diphenhydrAMINE (BENADRYL) 25 MG tablet Take 1 tablet (25 mg total) by mouth every 6 (six) hours as needed for itching. 10/03/17   Prescilla Sours, MD  EPINEPHrine 0.3 mg/0.3 mL IJ SOAJ injection Inject 0.3 mLs (0.3 mg total) into the muscle once as needed for up to 1 dose (Tongue or throat swelling). 10/03/17   Prescilla Sours, MD  predniSONE (DELTASONE) 10 MG tablet Take 4 tablets (40 mg total) by mouth daily for 3 days. 10/03/17 10/06/17  Prescilla Sours, MD    Family History Family History  Problem Relation Age of Onset  . Heart attack Maternal Grandfather   . Diabetes Paternal Grandmother   . Diabetes Paternal Grandfather   .  Hyperlipidemia Paternal Grandfather   . Hypertension Paternal Grandfather   . Heart disease Paternal Grandfather   . Cancer Maternal Aunt        breast    Social History Social History   Tobacco Use  . Smoking status: Never Smoker  . Smokeless tobacco: Never Used  Substance Use Topics  . Alcohol use: Yes    Alcohol/week: 3.6 oz    Types: 6 Cans of beer per week    Comment: occasional  . Drug  use: No     Allergies   Lamictal [lamotrigine]   Review of Systems Review of Systems  Constitutional: Negative for fever.  HENT: Positive for voice change. Negative for congestion and sore throat.   Eyes: Negative for visual disturbance.  Respiratory: Negative for cough and shortness of breath.   Cardiovascular: Negative for chest pain.  Gastrointestinal: Negative for abdominal pain, diarrhea and vomiting.  Genitourinary: Negative for dysuria.  Musculoskeletal: Negative for back pain.  Skin: Negative for rash.  Neurological: Negative for headaches.  All other systems reviewed and are negative.    Physical Exam Updated Vital Signs BP (!) 142/81   Pulse 75   Temp 98.2 F (36.8 C) (Oral)   Resp (!) 22   Ht 5\' 8"  (1.727 m)   Wt 67.6 kg (149 lb)   SpO2 99%   BMI 22.66 kg/m   Physical Exam  Constitutional: He is oriented to person, place, and time. No distress.  HENT:  Head: Normocephalic and atraumatic.  Mouth/Throat: Oropharynx is clear and moist.  Voice is abnormal due to decreased tongue articulation. No obvious oropharyngeal edema or glossomegaly on exam.   Eyes: Pupils are equal, round, and reactive to light.  Neck: Neck supple. No JVD present.  Cardiovascular: Normal rate, regular rhythm, normal heart sounds and intact distal pulses.  No murmur heard. Pulmonary/Chest: Breath sounds normal. No respiratory distress. He has no wheezes. He has no rales.  Abdominal: Soft. He exhibits no distension and no mass. There is no tenderness. There is no guarding.  Musculoskeletal: Normal range of motion. He exhibits no edema.  Neurological: He is alert and oriented to person, place, and time.  Skin: Skin is warm and dry.  Psychiatric: He has a normal mood and affect. His behavior is normal.  Nursing note and vitals reviewed.    ED Treatments / Results  Labs (all labs ordered are listed, but only abnormal results are displayed) Labs Reviewed - No data to  display  EKG EKG Interpretation  Date/Time:  Friday October 03 2017 21:01:50 EDT Ventricular Rate:  84 PR Interval:    QRS Duration: 93 QT Interval:  363 QTC Calculation: 430 R Axis:   80 Text Interpretation:  Sinus rhythm Borderline short PR interval Borderline T wave abnormalities No previous tracing Confirmed by [name] [phone]) on 10/03/2017 9:16:06 PM   Radiology No results found.  Procedures Procedures (including critical care time)  Medications Ordered in ED Medications  EPINEPHrine (EPI-PEN) injection 0.3 mg (0.3 mg Intramuscular Given 10/03/17 2054)  methylPREDNISolone sodium succinate (SOLU-MEDROL) 125 mg/2 mL injection 125 mg (125 mg Intravenous Given 10/03/17 2059)  diphenhydrAMINE (BENADRYL) injection 25 mg (25 mg Intravenous Given 10/03/17 2101)  famotidine (PEPCID) IVPB 20 mg premix (0 mg Intravenous Stopped 10/03/17 2134)     Initial Impression / Assessment and Plan / ED Course  I have reviewed the triage vital signs and the nursing notes.  Pertinent labs & imaging results that were available during my care of the  patient were reviewed by me and considered in my medical decision making (see chart for details).  Patient is a 27 y.o. male presenting to the ED for sensation of tongue swelling and speech change as above. He was given EpiPen in triage and promptly bedded in the ED. He had no objective signs of angioedema on my evaluation and his voice was not muffled, he seemed to have difficulty moving his tongue. No other signs of anaphylaxis. No neuro symptoms. His symptoms rapidly resolved after epinephrine. Unclear if true angioedematous reaction but, given his response, patient given SoluMedrol and H1/H2 blockers. Do not feel ENT consultation or nasopharyngoscopy is warranted. We will plan to observe him in the ED for minimum of four hours for recurrence. If he remains improved, anticipate discharge with steroid burst and EpiPen.  Patient care handed off to [facility], PA-C, at about 0000. Please see their documentation for remainder of ED course and ultimate disposition.   Final Clinical Impressions(s) / ED Diagnoses   Final diagnoses:  Allergic reaction, initial encounter    ED Discharge Orders        Ordered    EPINEPHrine 0.3 mg/0.3 mL IJ SOAJ injection  Once PRN     10/03/17 2245    predniSONE (DELTASONE) 10 MG tablet  Daily     10/03/17 2245    diphenhydrAMINE (BENADRYL) 25 MG tablet  Every 6 hours PRN     10/03/17 2245       Prescilla Sours, MD 10/04/17 Adelfa Koh    [name], MD 10/04/17 1504

## 2017-10-03 NOTE — ED Triage Notes (Signed)
Pt endorses possible allergic reaction after eating [place] nuts 1 hour ago. Pt endorses swelling sensation under tongue and in throat. Airway intact, no wheezing or shob. Pt states that he is talking funny and it is not his norm. Has not taken any medications.

## 2017-10-04 NOTE — ED Provider Notes (Signed)
1:02 AM Patient reassessed.  He has no complaints of difficulty breathing or swallowing.  States that he feels at baseline.  His lungs are clear to auscultation bilaterally.  No hypoxia.  Will discharge with supportive care instructions.  Return precautions discussed and provided. Patient discharged in stable condition with no unaddressed concerns.  Vitals:   10/04/17 0000 10/04/17 0015 10/04/17 0030 10/04/17 0045  BP: 137/80 (!) 144/79 (!) 143/83 (!) 142/83  Pulse: 68 73 74 70  Resp: 16 14 18 18   Temp:      TempSrc:      SpO2: 99% 98% 98% 98%  Weight:      Height:          Antonietta Breach, PA-C 10/04/17 0102    Palumbo, April, MD 10/04/17 0201

## 2018-01-22 ENCOUNTER — Ambulatory Visit (INDEPENDENT_AMBULATORY_CARE_PROVIDER_SITE_OTHER): Payer: BLUE CROSS/BLUE SHIELD | Admitting: Nurse Practitioner

## 2018-01-22 ENCOUNTER — Encounter: Payer: Self-pay | Admitting: Nurse Practitioner

## 2018-01-22 VITALS — BP 144/83 | HR 66 | Temp 98.9°F | Ht 68.0 in | Wt 165.0 lb

## 2018-01-22 DIAGNOSIS — Z23 Encounter for immunization: Secondary | ICD-10-CM

## 2018-01-22 DIAGNOSIS — F902 Attention-deficit hyperactivity disorder, combined type: Secondary | ICD-10-CM

## 2018-01-22 DIAGNOSIS — F3341 Major depressive disorder, recurrent, in partial remission: Secondary | ICD-10-CM | POA: Diagnosis not present

## 2018-01-22 MED ORDER — METHYLPHENIDATE HCL ER (PM) 40 MG PO CP24: 1.0000 | ORAL_CAPSULE | Freq: Every day | ORAL | 0 refills | Status: DC

## 2018-01-22 MED ORDER — CITALOPRAM HYDROBROMIDE 40 MG PO TABS
40.0000 mg | ORAL_TABLET | Freq: Every day | ORAL | 3 refills | Status: DC
Start: 2018-01-22 — End: 2019-04-06

## 2018-01-22 MED ORDER — ARIPIPRAZOLE 10 MG PO TABS: 10.0000 mg | ORAL_TABLET | Freq: Every day | ORAL | 5 refills | Status: DC

## 2018-01-22 NOTE — Progress Notes (Signed)
Subjective:    Patient ID: Charles [name], male    DOB: [birth year], 29 y.o.   MRN: [id number]   Chief Complaint: bipolar and ADHD  HPI:  1. Attention deficit hyperactivity disorder (ADHD), combined type  Patient is currently on adderal XR 25mg  diay. It is working well but he would like to try journay because it will be working as soon as he wakes up.  2. Recurrent major depressive disorder, in partial remission ([place])  He is curently n celexa. At last visit he stated that he had been on abilify in the past which worked well but was to expensive at the time. So we added it back at last visit. Now he says that it makes him to sleepy. He stopped taking for a couple of weeks and started baclk on half tablet and that is working well.    Outpatient Encounter Medications as of 01/22/2018  Medication Sig  . amphetamine-dextroamphetamine (ADDERALL XR) 25 MG 24 hr capsule Take 1 capsule by mouth every morning. (Patient taking differently: Take 25 mg by mouth daily. )  . amphetamine-dextroamphetamine (ADDERALL XR) 25 MG 24 hr capsule Take 1 capsule by mouth every morning. (Patient not taking: Reported on 10/03/2017)  . amphetamine-dextroamphetamine (ADDERALL XR) 25 MG 24 hr capsule Take 1 capsule by mouth every morning. (Patient not taking: Reported on 10/03/2017)  . ARIPiprazole (ABILIFY) 20 MG tablet Take 1 tablet (20 mg total) by mouth daily.  . citalopram (CELEXA) 40 MG tablet Take 1 tablet (40 mg total) by mouth daily.  . diphenhydrAMINE (BENADRYL) 25 MG tablet Take 1 tablet (25 mg total) by mouth every 6 (six) hours as needed for itching.  Marland Kitchen EPINEPHrine 0.3 mg/0.3 mL IJ SOAJ injection Inject 0.3 mLs (0.3 mg total) into the muscle once as needed for up to 1 dose (Tongue or throat swelling).  . Multiple Vitamin (MULTIVITAMIN WITH MINERALS) TABS tablet Take 1 tablet by mouth daily.  Marland Kitchen testosterone (ANDROGEL) 50 MG/5GM (1%) GEL Place 5 g onto the skin daily.  . valACYclovir (VALTREX) 1000 MG tablet  Take 1 tablet (1,000 mg total) by mouth 2 (two) times daily as needed. Prn as needed for fever blisters      New complaints: None today  Social history: Live sin his sisters basement   Review of Systems  Constitutional: Negative for activity change and appetite change.  HENT: Negative.   Eyes: Negative for pain.  Respiratory: Negative for shortness of breath.   Cardiovascular: Negative for chest pain, palpitations and leg swelling.  Gastrointestinal: Negative for abdominal pain.  Endocrine: Negative for polydipsia.  Genitourinary: Negative.   Skin: Negative for rash.  Neurological: Negative for dizziness, weakness and headaches.  Hematological: Does not bruise/bleed easily.  Psychiatric/Behavioral: Negative.   All other systems reviewed and are negative.      Objective:   Physical Exam  Constitutional: He is oriented to person, place, and time. He appears well-developed and well-nourished.  Cardiovascular: Normal rate and regular rhythm.  Pulmonary/Chest: Effort normal.  Neurological: He is alert and oriented to person, place, and time.  Skin: Skin is warm.  Psychiatric: He has a normal mood and affect. His behavior is normal. Judgment and thought content normal.  Very talkative Good eye contact Answers all questions appropriately.    BP (!) 144/83   Pulse 66   Temp 98.9 F (37.2 C) (Oral)   Ht 5\' 8"  (1.727 m)   Wt 165 lb (74.8 kg)   BMI 25.09 kg/m  Assessment & Plan:  Charles [name] in today with chief complaint of Medical Management of Chronic Issues   1. Attention deficit hyperactivity disorder (ADHD), combined type Stress management Changed from adderall to journay- he will let me know if helps before doing further refills - Methylphenidate HCl ER, PM, (JORNAY PM) 40 MG CP24; Take 1 tablet by mouth at bedtime.  Dispense: 30 capsule; Refill: 0  2. Recurrent major depressive disorder, in partial remission ([facility]) Stress management Decreased abilify  dose frm 20mg  to 10mg  daily - ARIPiprazole (ABILIFY) 10 MG tablet; Take 1 tablet (10 mg total) by mouth daily.  Dispense: 30 tablet; Refill: 5 - citalopram (CELEXA) 40 MG tablet; Take 1 tablet (40 mg total) by mouth daily.  Dispense: 90 tablet; Refill: [place], FNP

## 2018-02-17 ENCOUNTER — Other Ambulatory Visit: Payer: Self-pay | Admitting: Nurse Practitioner

## 2018-02-17 MED ORDER — METHYLPHENIDATE HCL ER (PM) 60 MG PO CP24: 1.0000 | ORAL_CAPSULE | Freq: Every day | ORAL | 0 refills | Status: DC

## 2018-02-26 ENCOUNTER — Other Ambulatory Visit: Payer: Self-pay | Admitting: Nurse Practitioner

## 2018-02-26 MED ORDER — METHYLPHENIDATE HCL ER (PM) 80 MG PO CP24: 80.0000 mg | ORAL_CAPSULE | Freq: Every day | ORAL | 0 refills | Status: DC

## 2018-05-04 ENCOUNTER — Telehealth: Payer: Self-pay

## 2018-05-04 DIAGNOSIS — F902 Attention-deficit hyperactivity disorder, combined type: Secondary | ICD-10-CM

## 2018-05-04 NOTE — Telephone Encounter (Signed)
Patient started his new job today and will not have insurance for 60 days. The Charles [name] is working very well but won't be able to afford for the next 2 months. Can he switch back to Adderall for 2 months and then go back to [place] when his insurance takes affect? Please advise

## 2018-05-05 ENCOUNTER — Other Ambulatory Visit: Payer: Self-pay | Admitting: Nurse Practitioner

## 2018-05-05 MED ORDER — AMPHETAMINE-DEXTROAMPHET ER 25 MG PO CP24: 25.0000 mg | ORAL_CAPSULE | ORAL | 0 refills | Status: DC

## 2018-06-22 ENCOUNTER — Other Ambulatory Visit: Payer: Self-pay

## 2018-06-22 ENCOUNTER — Ambulatory Visit (INDEPENDENT_AMBULATORY_CARE_PROVIDER_SITE_OTHER): Payer: BLUE CROSS/BLUE SHIELD | Admitting: Nurse Practitioner

## 2018-06-22 ENCOUNTER — Encounter: Payer: Self-pay | Admitting: Nurse Practitioner

## 2018-06-22 DIAGNOSIS — F902 Attention-deficit hyperactivity disorder, combined type: Secondary | ICD-10-CM

## 2018-06-22 MED ORDER — AMPHETAMINE-DEXTROAMPHET ER 30 MG PO CP24: 30.0000 mg | ORAL_CAPSULE | ORAL | 0 refills | Status: DC

## 2018-06-22 NOTE — Progress Notes (Signed)
Patient ID: Charles [name], male   DOB: 04-23-1990, 28 y.o.   MRN: [id number]      Virtual Visit via telephone Note  I connected withJustin D [name] on 06/22/18 at11:30 AM by telephone and verified that I am speaking with the correct person using two identifiers. Charles [name] is currently located at home and no one is currently with her during visit. The provider, Mary-Margaret Hassell Done, FNP is located in their office at time of visit.  I discussed the limitations, risks, security and privacy concerns of performing an evaluation and management service by telephone and the availability of in person appointments. I also discussed with the patient that there may be a patient responsible charge related to this service. The patient expressed understanding and agreed to proceed.   History and Present Illness:   Chief Complaint: ADHD  HPI Patient calls in to discuss his ADHD. He is currently on adderall 25mg  daily. Is working OK but could and to have it pumped up a little.  denies any side effects. No problems sleeping.    Review of Systems  Constitutional: Negative for diaphoresis and weight loss.  Eyes: Negative for blurred vision, double vision and pain.  Respiratory: Negative for shortness of breath.   Cardiovascular: Negative for chest pain, palpitations, orthopnea and leg swelling.  Gastrointestinal: Negative for abdominal pain.  Skin: Negative for rash.  Neurological: Negative for dizziness, sensory change, loss of consciousness, weakness and headaches.  Endo/Heme/Allergies: Negative for polydipsia. Does not bruise/bleed easily.  Psychiatric/Behavioral: Negative for memory loss. The patient does not have insomnia.   All other systems reviewed and are negative.    Observations/Objective: Alert and oriented- answers all questions appropriately  Assessment and Plan: [name] in today with chief complaint of No chief complaint on file.   1. Attention deficit  hyperactivity disorder (ADHD), combined type  Meds ordered this encounter  Medications  . amphetamine-dextroamphetamine (ADDERALL XR) 30 MG 24 hr capsule    Sig: Take 1 capsule (30 mg total) by mouth every morning for 30 days.    Dispense:  30 capsule    Refill:  0    Order Specific Question:   Supervising Provider    Answer:   Caryl Pina A [id number]  . amphetamine-dextroamphetamine (ADDERALL XR) 30 MG 24 hr capsule    Sig: Take 1 capsule (30 mg total) by mouth every morning for 30 days.    Dispense:  30 capsule    Refill:  0    Order Specific Question:   Supervising Provider    Answer:   Caryl Pina A [id number]  . amphetamine-dextroamphetamine (ADDERALL XR) 30 MG 24 hr capsule    Sig: Take 1 capsule (30 mg total) by mouth every morning for 30 days.    Dispense:  30 capsule    Refill:  0    Order Specific Question:   Supervising Provider    Answer:   Caryl Pina A [[id number]]   Increased adderall xr to 30 mg daily Stress management discussed  FOLLOW UP: 3 months  I discussed the assessment and treatment plan with the patient. The patient was provided an opportunity to ask questions and all were answered. The patient agreed with the plan and demonstrated an understanding of the instructions.  The patient was advised to call back or seek an in-person evaluation if the symptoms worsen or if the condition fails to improve as anticipated.  The above assessment and management plan was discussed with the patient. The patient  verbalized understanding of and has agreed to the management plan. Patient is aware to call the clinic if symptoms persist or worsen. Patient is aware when to return to the clinic for a follow-up visit. Patient educated on when it is appropriate to go to the emergency department.    I provided 8 minutes of non-face-to-face time during this encounter.    Mary-Margaret Hassell Done, FNP

## 2018-10-12 ENCOUNTER — Encounter: Payer: Self-pay | Admitting: Nurse Practitioner

## 2018-10-12 ENCOUNTER — Ambulatory Visit (INDEPENDENT_AMBULATORY_CARE_PROVIDER_SITE_OTHER): Payer: Self-pay | Admitting: Nurse Practitioner

## 2018-10-12 DIAGNOSIS — F902 Attention-deficit hyperactivity disorder, combined type: Secondary | ICD-10-CM

## 2018-10-12 MED ORDER — AMPHETAMINE-DEXTROAMPHET ER 30 MG PO CP24
30.0000 mg | ORAL_CAPSULE | ORAL | 0 refills | Status: DC
Start: 2018-12-11 — End: 2019-03-15

## 2018-10-12 MED ORDER — AMPHETAMINE-DEXTROAMPHET ER 30 MG PO CP24: 30.0000 mg | ORAL_CAPSULE | ORAL | 0 refills | Status: DC

## 2018-10-12 NOTE — Progress Notes (Signed)
Virtual Visit via telephone Note Due to COVID-19 pandemic this visit was conducted virtually. This visit type was conducted due to national recommendations for restrictions regarding the COVID-19 Pandemic (e.g. social distancing, sheltering in place) in an effort to limit this patient's exposure and mitigate transmission in our community. All issues noted in this document were discussed and addressed.  A physical exam was not performed with this format.  I connected with Charles [name] on 10/12/18 at 11:55 by telephone and verified that I am speaking with the correct person using two identifiers. Charles [name] is currently located at work and no one is currently with her during visit. The provider, Mary-Margaret Hassell Done, FNP is located in their office at time of visit.  I discussed the limitations, risks, security and privacy concerns of performing an evaluation and management service by telephone and the availability of in person appointments. I also discussed with the patient that there may be a patient responsible charge related to this service. The patient expressed understanding and agreed to proceed.   History and Present Illness:   Chief Complaint: ADHD   HPI Patient calls in today for follow up of ADHD. He has been on ADHD meds since he was in high school. He is not able to concentrate at work without meds. He is currently on adderall and is doing well. Denies any side effects and no weigth loss. He would like to say on his current meds.    Review of Systems  Constitutional: Negative for diaphoresis and weight loss.  Eyes: Negative for blurred vision, double vision and pain.  Respiratory: Negative for shortness of breath.   Cardiovascular: Negative for chest pain, palpitations, orthopnea and leg swelling.  Gastrointestinal: Negative for abdominal pain.  Skin: Negative for rash.  Neurological: Negative for dizziness, sensory change, loss of consciousness, weakness and headaches.   Endo/Heme/Allergies: Negative for polydipsia. Does not bruise/bleed easily.  Psychiatric/Behavioral: Negative for memory loss. The patient does not have insomnia.   All other systems reviewed and are negative.    Observations/Objective: Alert and oriented- answers all questions appropriately.  No distress  Assessment and Plan: Charles [name] in today with chief complaint of ADHD   1. Attention deficit hyperactivity disorder (ADHD), combined type Continue stress management - amphetamine-dextroamphetamine (ADDERALL XR) 30 MG 24 hr capsule; Take 1 capsule (30 mg total) by mouth every morning.  Dispense: 30 capsule; Refill: 0 - amphetamine-dextroamphetamine (ADDERALL XR) 30 MG 24 hr capsule; Take 1 capsule (30 mg total) by mouth every morning.  Dispense: 30 capsule; Refill: 0 - amphetamine-dextroamphetamine (ADDERALL XR) 30 MG 24 hr capsule; Take 1 capsule (30 mg total) by mouth every morning.  Dispense: 30 capsule; Refill: 0   Follow Up Instructions: 3 months    I discussed the assessment and treatment plan with the patient. The patient was provided an opportunity to ask questions and all were answered. The patient agreed with the plan and demonstrated an understanding of the instructions.   The patient was advised to call back or seek an in-person evaluation if the symptoms worsen or if the condition fails to improve as anticipated.  The above assessment and management plan was discussed with the patient. The patient verbalized understanding of and has agreed to the management plan. Patient is aware to call the clinic if symptoms persist or worsen. Patient is aware when to return to the clinic for a follow-up visit. Patient educated on when it is appropriate to go to the emergency department.  Time call ended:  12:05  I provided 10 minutes of non-face-to-face time during this encounter.    Mary-Margaret Hassell Done, FNP

## 2018-11-12 ENCOUNTER — Ambulatory Visit: Payer: Self-pay | Admitting: Nurse Practitioner

## 2018-11-27 ENCOUNTER — Other Ambulatory Visit: Payer: Self-pay

## 2018-11-27 ENCOUNTER — Other Ambulatory Visit: Payer: Self-pay | Admitting: Nurse Practitioner

## 2018-11-27 ENCOUNTER — Ambulatory Visit (INDEPENDENT_AMBULATORY_CARE_PROVIDER_SITE_OTHER): Payer: Commercial Managed Care - PPO

## 2018-11-27 ENCOUNTER — Encounter: Payer: Self-pay | Admitting: Nurse Practitioner

## 2018-11-27 ENCOUNTER — Ambulatory Visit: Payer: Commercial Managed Care - PPO | Admitting: Nurse Practitioner

## 2018-11-27 VITALS — BP 120/80 | HR 87 | Temp 99.1°F | Resp 16 | Ht 68.0 in | Wt 165.0 lb

## 2018-11-27 DIAGNOSIS — M25571 Pain in right ankle and joints of right foot: Secondary | ICD-10-CM

## 2018-11-27 MED ORDER — NAPROXEN 500 MG PO TABS: 500.0000 mg | ORAL_TABLET | Freq: Two times a day (BID) | ORAL | 1 refills | Status: DC

## 2018-11-27 NOTE — Patient Instructions (Signed)
Tendinitis  Tendinitis is inflammation of a tendon. A tendon is a strong cord of tissue that connects muscle to bone. Tendinitis can affect any tendon, but it most commonly affects the:  Shoulder tendon (rotator cuff).  Ankle tendon (Achilles tendon).  Elbow tendon (triceps tendon).  Tendons in the wrist. What are the causes? This condition may be caused by:  Overusing a tendon or muscle. This is common.  Age-related wear and tear.  Injury.  Inflammatory conditions, such as arthritis.  Certain medicines. What increases the risk? You are more likely to develop this condition if you do activities that involve the same movements over and over again (repetitive motions). What are the signs or symptoms? Symptoms of this condition may include:  Pain.  Tenderness.  Mild swelling.  Decreased range of motion. How is this diagnosed? This condition is diagnosed with a physical exam. You may also have tests, such as:  Ultrasound. This uses sound waves to make an image of the inside of your body in the affected area.  MRI. How is this treated? This condition may be treated by resting, icing, applying pressure (compression), and raising (elevating) the affected area above the level of your heart. This is known as RICE therapy. Treatment may also include:  Medicines to help reduce inflammation or to help reduce pain.  Exercises or physical therapy to strengthen and stretch the tendon.  A brace or splint.  Surgery. This is rarely needed. Follow these instructions at home: If you have a splint or brace:  Wear the splint or brace as told by your health care provider. Remove it only as told by your health care provider.  Loosen the splint or brace if your fingers or toes tingle, become numb, or turn cold and blue.  Keep the splint or brace clean.  If the splint or brace is not waterproof: ? Do not let it get wet. ? Cover it with a watertight covering when you take a bath  or shower. Managing pain, stiffness, and swelling  If directed, put ice on the affected area. ? If you have a removable splint or brace, remove it as told by your health care provider. ? Put ice in a plastic bag. ? Place a towel between your skin and the bag. ? Leave the ice on for 20 minutes, 2-3 times a day.  Move the fingers or toes of the affected limb often, if this applies. This can help to prevent stiffness and lessen swelling.  If directed, raise (elevate) the affected area above the level of your heart while you are sitting or lying down.  If directed, apply heat to the affected area before you exercise. Use the heat source that your health care provider recommends, such as a moist heat pack or a heating pad.     ? Place a towel between your skin and the heat source. ? Leave the heat on for 20-30 minutes. ? Remove the heat if your skin turns bright red. This is especially important if you are unable to feel pain, heat, or cold. You may have a greater risk of getting burned. Driving  Do not drive or use heavy machinery while taking prescription pain medicine.  Ask your health care provider when it is safe to drive if you have a splint or brace on any part of your arm or leg. Activity  Rest the affected area as told by your health care provider.  Return to your normal activities as told by your health care   provider. Ask your health care provider what activities are safe for you.  Avoid using the affected area while you are experiencing symptoms of tendinitis.  Do exercises as told by your health care provider. General instructions  If you have a splint, do not put pressure on any part of the splint until it is fully hardened. This may take several hours.  Wear an elastic bandage or compression wrap only as told by your health care provider.  Take over-the-counter and prescription medicines only as told by your health care provider.  Keep all follow-up visits as told  by your health care provider. This is important. Contact a health care provider if:  Your symptoms do not improve.  You develop new, unexplained problems, such as numbness in your hands. Summary  Tendinitis is inflammation of a tendon.  You are more likely to develop this condition if you do activities that involve the same movements over and over again.  This condition may be treated by resting, icing, applying pressure (compression), and elevating the area above the level of your heart. This is known as RICE therapy.  Avoid using the affected area while you are experiencing symptoms of tendinitis. This information is not intended to replace advice given to you by your health care provider. Make sure you discuss any questions you have with your health care provider. Document Released: 02/23/2000 Document Revised: 09/02/2017 Document Reviewed: 07/16/2017 Elsevier Patient Education  2020 [employer]  

## 2018-11-27 NOTE — Progress Notes (Signed)
   Subjective:    Patient ID: Charles [name], male    DOB: 12/09/1990, 28 y.o.   MRN: [id number]   Chief Complaint: Foot Pain (right. No injury)   HPI Patient comes in today c/o of right foot pain. Started in the middle of night. denies any injury. Rates pain 4/10 at rest [id number] with certain movements or walking   Review of Systems  Constitutional: Negative for activity change and appetite change.  HENT: Negative.   Eyes: Negative for pain.  Respiratory: Negative for shortness of breath.   Cardiovascular: Negative for chest pain, palpitations and leg swelling.  Gastrointestinal: Negative for abdominal pain.  Endocrine: Negative for polydipsia.  Genitourinary: Negative.   Skin: Negative for rash.  Neurological: Negative for dizziness, weakness and headaches.  Hematological: Does not bruise/bleed easily.  Psychiatric/Behavioral: Negative.   All other systems reviewed and are negative.      Objective:   Physical Exam Vitals signs and nursing note reviewed.  Constitutional:      Appearance: Normal appearance.  Cardiovascular:     Rate and Rhythm: Normal rate and regular rhythm.     Heart sounds: Normal heart sounds.  Pulmonary:     Effort: Pulmonary effort is normal.     Breath sounds: Normal breath sounds.  Musculoskeletal:     Comments: Right lateral foot swollen and tender to touch Pain on inversion  Neurological:     General: No focal deficit present.     Mental Status: He is alert and oriented to person, place, and time.  Psychiatric:        Mood and Affect: Mood normal.        Behavior: Behavior normal.    BP 120/80   Pulse 87   Temp 99.1 F (37.3 C) (Oral)   Resp 16   Ht 5\' 8"  (1.727 m)   Wt 165 lb (74.8 kg)   SpO2 98%   BMI 25.09 kg/m   Right foot xray- no fracture       Assessment & Plan:  [name] in today with chief complaint of Foot Pain (right. No injury)   1. Acute right ankle pain Fracture shoe Soak in epsom salt bid Rest RTO prn  - naproxen (NAPROSYN) 500 MG tablet; Take 1 tablet (500 mg total) by mouth 2 (two) times daily with a meal.  Dispense: 60 tablet; Refill: [place], FNP

## 2018-12-15 ENCOUNTER — Encounter: Payer: Self-pay | Admitting: Physician Assistant

## 2018-12-15 ENCOUNTER — Other Ambulatory Visit: Payer: Self-pay

## 2018-12-15 ENCOUNTER — Ambulatory Visit: Payer: Commercial Managed Care - PPO | Admitting: Physician Assistant

## 2018-12-15 VITALS — BP 126/80 | HR 72 | Temp 98.6°F | Ht 68.0 in | Wt 173.0 lb

## 2018-12-15 DIAGNOSIS — Z872 Personal history of diseases of the skin and subcutaneous tissue: Secondary | ICD-10-CM | POA: Diagnosis not present

## 2018-12-15 DIAGNOSIS — E291 Testicular hypofunction: Secondary | ICD-10-CM | POA: Diagnosis not present

## 2018-12-15 DIAGNOSIS — R360 Urethral discharge without blood: Secondary | ICD-10-CM | POA: Diagnosis not present

## 2018-12-15 DIAGNOSIS — L989 Disorder of the skin and subcutaneous tissue, unspecified: Secondary | ICD-10-CM

## 2018-12-16 NOTE — Progress Notes (Signed)
BP 126/80   Pulse 72   Temp 98.6 F (37 C) (Temporal)   Ht 5\' 8"  (1.727 m)   Wt 173 lb (78.5 kg)   SpO2 97%   BMI 26.30 kg/m    Subjective:    Patient ID: Charles [name], male    DOB: 03-11-1991, 28 y.o.   MRN: [id number]  HPI: Charles [name] is a 28 y.o. male presenting on 12/15/2018 for Cyst  This patient comes in having noticed a little skin lesion at his inguinal fold where the pain is in starts it feels like a knot.  He states he has had some shaved bones in that area does remember having 1 he denies any fever or chills.  He does not know of any sexual exposure to any conditions.   Not too long ago in that area.  But at this point it just feels like it is underneath the skin.  He has not seen any blisters he has not had any recent pus. He did have a little bit of brown looking blood spot in when he voided.  He denies any extra sexual activity and none for a few weeks.  Past Medical History:  Diagnosis Date  . ADD (attention deficit disorder) f  . Allergic rhinitis   . Gynecomastia, male    Relevant past medical, surgical, family and social history reviewed and updated as indicated. Interim medical history since our last visit reviewed. Allergies and medications reviewed and updated. DATA REVIEWED: CHART IN EPIC  Family History reviewed for pertinent findings.  Review of Systems  Constitutional: Negative.  Negative for appetite change and fatigue.  Eyes: Negative for pain and visual disturbance.  Gastrointestinal: Negative for nausea and vomiting.  Genitourinary: Positive for genital sores. Negative for penile pain, penile swelling, scrotal swelling and testicular pain.  Skin: Negative.  Negative for color change and rash.  Neurological: Negative.  Negative for weakness.  Psychiatric/Behavioral: Negative.     Allergies as of 12/15/2018      Reactions   Lamictal [lamotrigine] Other (See Comments)   "Muscle Spasms in back"      Medication List       Accurate as  of December 15, 2018 11:59 PM. If you have any questions, ask your nurse or doctor.        STOP taking these medications   Methylphenidate HCl ER (PM) 80 MG Cp24 Commonly known as: Jornay PM Stopped by: Terald Sleeper, PA-C     TAKE these medications   amphetamine-dextroamphetamine 30 MG 24 hr capsule Commonly known as: ADDERALL XR Take 1 capsule (30 mg total) by mouth every morning.   amphetamine-dextroamphetamine 30 MG 24 hr capsule Commonly known as: ADDERALL XR Take 1 capsule (30 mg total) by mouth every morning.   amphetamine-dextroamphetamine 30 MG 24 hr capsule Commonly known as: ADDERALL XR Take 1 capsule (30 mg total) by mouth every morning.   ARIPiprazole 10 MG tablet Commonly known as: Abilify Take 1 tablet (10 mg total) by mouth daily.   citalopram 40 MG tablet Commonly known as: CeleXA Take 1 tablet (40 mg total) by mouth daily.   diphenhydrAMINE 25 MG tablet Commonly known as: BENADRYL Take 1 tablet (25 mg total) by mouth every 6 (six) hours as needed for itching.   EPINEPHrine 0.3 mg/0.3 mL Soaj injection Commonly known as: EPI-PEN Inject 0.3 mLs (0.3 mg total) into the muscle once as needed for up to 1 dose (Tongue or throat swelling).  multivitamin with minerals Tabs tablet Take 1 tablet by mouth daily.   naproxen 500 MG tablet Commonly known as: Naprosyn Take 1 tablet (500 mg total) by mouth 2 (two) times daily with a meal.   testosterone 50 MG/5GM (1%) Gel Commonly known as: ANDROGEL Place 5 g onto the skin daily.   valACYclovir 1000 MG tablet Commonly known as: VALTREX Take 1 tablet (1,000 mg total) by mouth 2 (two) times daily as needed. Prn as needed for fever blisters          Objective:    BP 126/80   Pulse 72   Temp 98.6 F (37 C) (Temporal)   Ht 5\' 8"  (1.727 m)   Wt 173 lb (78.5 kg)   SpO2 97%   BMI 26.30 kg/m   Allergies  Allergen Reactions  . Lamictal [Lamotrigine] Other (See Comments)    "Muscle Spasms in back"     Wt Readings from Last 3 Encounters:  12/15/18 173 lb (78.5 kg)  11/27/18 165 lb (74.8 kg)  01/22/18 165 lb (74.8 kg)    Physical Exam Vitals signs and nursing note reviewed.  Constitutional:      General: He is not in acute distress.    Appearance: He is well-developed.  HENT:     Head: Normocephalic and atraumatic.  Eyes:     Conjunctiva/sclera: Conjunctivae normal.     Pupils: Pupils are equal, round, and reactive to light.  Cardiovascular:     Rate and Rhythm: Normal rate.  Pulmonary:     Effort: No respiratory distress.     Breath sounds: Normal breath sounds.  Genitourinary:      Comments: Subcutaneous raised area in base of (penis at the pubic mons, just along hair line. Skin healed, non tender and no break in the skin Skin:    General: Skin is warm and dry.  Psychiatric:        Behavior: Behavior normal.         Assessment & Plan:   1. Lesion of subcutaneous tissue Cortisone apply to area  to help fade Watch for 1 to 2 weeks and if it does not improve we may need to do an antibiotic  2. H/O ingrown hair See above  3. Penile discharge, without blood Watch and track if any more happens again  4. Hypogonadism in male Continue regular medications   Continue all other maintenance medications as listed above.  Follow up plan: No follow-ups on file.  Educational handout given for [place] PA-C [facility] [address]  [place], [place] [address] [phone]   12/16/2018, 6:21 PM

## 2019-01-25 ENCOUNTER — Ambulatory Visit (INDEPENDENT_AMBULATORY_CARE_PROVIDER_SITE_OTHER): Payer: Commercial Managed Care - PPO | Admitting: *Deleted

## 2019-01-25 DIAGNOSIS — Z23 Encounter for immunization: Secondary | ICD-10-CM | POA: Diagnosis not present

## 2019-02-17 ENCOUNTER — Other Ambulatory Visit: Payer: Self-pay

## 2019-02-17 DIAGNOSIS — Z20822 Contact with and (suspected) exposure to covid-19: Secondary | ICD-10-CM

## 2019-02-18 LAB — NOVEL CORONAVIRUS, NAA: SARS-CoV-2, NAA: NOT DETECTED

## 2019-03-15 ENCOUNTER — Ambulatory Visit (INDEPENDENT_AMBULATORY_CARE_PROVIDER_SITE_OTHER): Payer: Commercial Managed Care - PPO | Admitting: Nurse Practitioner

## 2019-03-15 ENCOUNTER — Encounter: Payer: Self-pay | Admitting: Nurse Practitioner

## 2019-03-15 DIAGNOSIS — F902 Attention-deficit hyperactivity disorder, combined type: Secondary | ICD-10-CM | POA: Diagnosis not present

## 2019-03-15 DIAGNOSIS — F3341 Major depressive disorder, recurrent, in partial remission: Secondary | ICD-10-CM | POA: Diagnosis not present

## 2019-03-15 MED ORDER — AMPHETAMINE-DEXTROAMPHET ER 30 MG PO CP24
30.0000 mg | ORAL_CAPSULE | ORAL | 0 refills | Status: DC
Start: 2019-03-15 — End: 2019-06-15

## 2019-03-15 MED ORDER — AMPHETAMINE-DEXTROAMPHET ER 30 MG PO CP24: 30.0000 mg | ORAL_CAPSULE | ORAL | 0 refills | Status: DC

## 2019-03-15 NOTE — Progress Notes (Signed)
Virtual Visit via telephone Note Due to COVID-19 pandemic this visit was conducted virtually. This visit type was conducted due to national recommendations for restrictions regarding the COVID-19 Pandemic (e.g. social distancing, sheltering in place) in an effort to limit this patient's exposure and mitigate transmission in our community. All issues noted in this document were discussed and addressed.  A physical exam was not performed with this format.  I connected with Charles [name] on 03/15/19 at 10:45 by telephone and verified that I am speaking with the correct person using two identifiers. Charles [name] is currently located at work and no one is currently with him during visit. The provider, Mary-Margaret Hassell Done, FNP is located in their office at time of visit.  I discussed the limitations, risks, security and privacy concerns of performing an evaluation and management service by telephone and the availability of in person appointments. I also discussed with the patient that there may be a patient responsible charge related to this service. The patient expressed understanding and agreed to proceed.   History and Present Illness:   Chief Complaint: ADHD    HPI:  1. Attention deficit hyperactivity disorder (ADHD), combined type Patient is currenlty on adderall 30mg  daily. He is doing well. Helps him to concerntrate on his works. Working at [employer] in [place].  2. Recurrent major depressive disorder, in partial remission ([place]) He is having some depression. He has not been taking his meds lately and says that he can tell that he needs to go back on it. He was on celexa and abilify  Depression screen [facility] 2/9 03/15/2019 12/15/2018 11/27/2018 01/22/2018 09/30/2017  Decreased Interest 1 0 0 1 1  Down, Depressed, Hopeless 2 0 0 1 1  PHQ - 2 Score 3 0 0 2 2  Altered sleeping 1 - - 1 0  Tired, decreased energy 1 - - 1 1  Change in appetite 0 - - 2 0  Feeling bad or failure about yourself  1 -  - 1 0  Trouble concentrating 0 - - 1 0  Moving slowly or fidgety/restless 0 - - 1 0  Suicidal thoughts 0 - - 0 0  PHQ-9 Score 6 - - 9 3  Difficult doing work/chores Somewhat difficult - - - -      Outpatient Encounter Medications as of 03/15/2019  Medication Sig  . amphetamine-dextroamphetamine (ADDERALL XR) 30 MG 24 hr capsule Take 1 capsule (30 mg total) by mouth every morning.  Marland Kitchen amphetamine-dextroamphetamine (ADDERALL XR) 30 MG 24 hr capsule Take 1 capsule (30 mg total) by mouth every morning.  Marland Kitchen amphetamine-dextroamphetamine (ADDERALL XR) 30 MG 24 hr capsule Take 1 capsule (30 mg total) by mouth every morning.  . ARIPiprazole (ABILIFY) 10 MG tablet Take 1 tablet (10 mg total) by mouth daily.  . citalopram (CELEXA) 40 MG tablet Take 1 tablet (40 mg total) by mouth daily.  . diphenhydrAMINE (BENADRYL) 25 MG tablet Take 1 tablet (25 mg total) by mouth every 6 (six) hours as needed for itching.  Marland Kitchen EPINEPHrine 0.3 mg/0.3 mL IJ SOAJ injection Inject 0.3 mLs (0.3 mg total) into the muscle once as needed for up to 1 dose (Tongue or throat swelling).  . Multiple Vitamin (MULTIVITAMIN WITH MINERALS) TABS tablet Take 1 tablet by mouth daily.  . naproxen (NAPROSYN) 500 MG tablet Take 1 tablet (500 mg total) by mouth 2 (two) times daily with a meal.  . testosterone (ANDROGEL) 50 MG/5GM (1%) GEL Place 5 g onto the skin  daily.  . valACYclovir (VALTREX) 1000 MG tablet Take 1 tablet (1,000 mg total) by mouth 2 (two) times daily as needed. Prn as needed for fever blisters   No facility-administered encounter medications on file as of 03/15/2019.    Past Surgical History:  Procedure Laterality Date  . KNEE ARTHROSCOPY W/ MENISCECTOMY  11/23/05   left knee - extensive bucket handle tear of medial meniscus  . SHOULDER ARTHROSCOPY W/ BANKART PROCEDURE  08/10/06  . TONSILLECTOMY  09/16/95    Family History  Problem Relation Age of Onset  . Heart attack Maternal Grandfather   . Diabetes Paternal  Grandmother   . Diabetes Paternal Grandfather   . Hyperlipidemia Paternal Grandfather   . Hypertension Paternal Grandfather   . Heart disease Paternal Grandfather   . Cancer Maternal Aunt        breast    New complaints: none  Social history: Lives with his sister currently  Controlled substance contract: will need new contract at next face to face visit   Review of Systems  Constitutional: Negative for diaphoresis and weight loss.  Eyes: Negative for blurred vision, double vision and pain.  Respiratory: Negative for shortness of breath.   Cardiovascular: Negative for chest pain, palpitations, orthopnea and leg swelling.  Gastrointestinal: Negative for abdominal pain.  Skin: Negative for rash.  Neurological: Negative for dizziness, sensory change, loss of consciousness, weakness and headaches.  Endo/Heme/Allergies: Negative for polydipsia. Does not bruise/bleed easily.  Psychiatric/Behavioral: Negative for memory loss. The patient does not have insomnia.   All other systems reviewed and are negative.    Observations/Objective: Alert and oriented- answers all questions appropriately No distress    Assessment and Plan: Charles [name] in today with chief complaint of ADHD   1. Attention deficit hyperactivity disorder (ADHD), combined type Stress management - amphetamine-dextroamphetamine (ADDERALL XR) 30 MG 24 hr capsule; Take 1 capsule (30 mg total) by mouth every morning.  Dispense: 30 capsule; Refill: 0 - amphetamine-dextroamphetamine (ADDERALL XR) 30 MG 24 hr capsule; Take 1 capsule (30 mg total) by mouth every morning.  Dispense: 30 capsule; Refill: 0 - amphetamine-dextroamphetamine (ADDERALL XR) 30 MG 24 hr capsule; Take 1 capsule (30 mg total) by mouth every morning.  Dispense: 30 capsule; Refill: 0  2. Recurrent major depressive disorder, in partial remission ([place]) Need to start back on abilify and celexa ASAP Exercise daily   Follow Up  Instructions: 50months    I discussed the assessment and treatment plan with the patient. The patient was provided an opportunity to ask questions and all were answered. The patient agreed with the plan and demonstrated an understanding of the instructions.   The patient was advised to call back or seek an in-person evaluation if the symptoms worsen or if the condition fails to improve as anticipated.  The above assessment and management plan was discussed with the patient. The patient verbalized understanding of and has agreed to the management plan. Patient is aware to call the clinic if symptoms persist or worsen. Patient is aware when to return to the clinic for a follow-up visit. Patient educated on when it is appropriate to go to the emergency department.   Time call ended:  11:00  I provided 15 minutes of non-face-to-face time during this encounter.    Mary-Margaret Hassell Done, FNP

## 2019-04-05 ENCOUNTER — Other Ambulatory Visit: Payer: Self-pay | Admitting: Nurse Practitioner

## 2019-04-05 DIAGNOSIS — F3341 Major depressive disorder, recurrent, in partial remission: Secondary | ICD-10-CM

## 2019-04-05 DIAGNOSIS — E291 Testicular hypofunction: Secondary | ICD-10-CM

## 2019-04-08 ENCOUNTER — Telehealth: Payer: Self-pay | Admitting: *Deleted

## 2019-04-08 NOTE — Telephone Encounter (Signed)
Prior Auth for Testosterone 50 MG/5GM(1%) gel-APPROVED till 04/07/20  Key: [id number]   PA Case ID: [id number]  Pharmacy notified

## 2019-06-15 ENCOUNTER — Ambulatory Visit (INDEPENDENT_AMBULATORY_CARE_PROVIDER_SITE_OTHER): Payer: Self-pay | Admitting: Nurse Practitioner

## 2019-06-15 ENCOUNTER — Other Ambulatory Visit: Payer: Self-pay

## 2019-06-15 ENCOUNTER — Encounter: Payer: Self-pay | Admitting: Nurse Practitioner

## 2019-06-15 VITALS — BP 121/82 | HR 85 | Temp 99.3°F | Resp 20 | Ht 68.0 in | Wt 174.0 lb

## 2019-06-15 DIAGNOSIS — F902 Attention-deficit hyperactivity disorder, combined type: Secondary | ICD-10-CM

## 2019-06-15 DIAGNOSIS — F3341 Major depressive disorder, recurrent, in partial remission: Secondary | ICD-10-CM

## 2019-06-15 MED ORDER — ARIPIPRAZOLE 10 MG PO TABS: 10.0000 mg | ORAL_TABLET | Freq: Every day | ORAL | 5 refills | Status: DC

## 2019-06-15 MED ORDER — CITALOPRAM HYDROBROMIDE 40 MG PO TABS: 40.0000 mg | ORAL_TABLET | Freq: Every day | ORAL | 1 refills | Status: DC

## 2019-06-15 MED ORDER — AMPHETAMINE-DEXTROAMPHET ER 30 MG PO CP24: 30.0000 mg | ORAL_CAPSULE | ORAL | 0 refills | Status: DC

## 2019-06-15 NOTE — Progress Notes (Signed)
Subjective:    Patient ID: Charles [name], male    DOB: 06/01/1990, 29 y.o.   MRN: [id number]   Chief Complaint: Medical Management of Chronic Issues    HPI:  1. Attention deficit hyperactivity disorder (ADHD), combined type Patient is current;y on adderall 30mg  daily . He has been on ADHD meds since high school. He is not able to concentrate at all without medication. He denies any side effects from medication. No problem sleeping.  2. Recurrent major depressive disorder, in partial remission ([place]) He is currently on celexa anc abilify. He says he is doing ok. No side effects from meds. Depression screen [facility] 2/9 06/15/2019 03/15/2019 12/15/2018  Decreased Interest 0 1 0  Down, Depressed, Hopeless 0 2 0  PHQ - 2 Score 0 3 0  Altered sleeping - 1 -  Tired, decreased energy - 1 -  Change in appetite - 0 -  Feeling bad or failure about yourself  - 1 -  Trouble concentrating - 0 -  Moving slowly or fidgety/restless - 0 -  Suicidal thoughts - 0 -  PHQ-9 Score - 6 -  Difficult doing work/chores - Somewhat difficult -       Outpatient Encounter Medications as of 06/15/2019  Medication Sig  . ARIPiprazole (ABILIFY) 10 MG tablet Take 1 tablet by mouth once daily  . citalopram (CELEXA) 40 MG tablet Take 1 tablet by mouth once daily  . Multiple Vitamin (MULTIVITAMIN WITH MINERALS) TABS tablet Take 1 tablet by mouth daily.  . valACYclovir (VALTREX) 1000 MG tablet Take 1 tablet (1,000 mg total) by mouth 2 (two) times daily as needed. Prn as needed for fever blisters  . amphetamine-dextroamphetamine (ADDERALL XR) 30 MG 24 hr capsule Take 1 capsule (30 mg total) by mouth every morning.  Marland Kitchen amphetamine-dextroamphetamine (ADDERALL XR) 30 MG 24 hr capsule Take 1 capsule (30 mg total) by mouth every morning.  Marland Kitchen amphetamine-dextroamphetamine (ADDERALL XR) 30 MG 24 hr capsule Take 1 capsule (30 mg total) by mouth every morning.  . diphenhydrAMINE (BENADRYL) 25 MG tablet Take 1 tablet (25 mg total) by  mouth every 6 (six) hours as needed for itching. (Patient not taking: Reported on 06/15/2019)  . EPINEPHrine 0.3 mg/0.3 mL IJ SOAJ injection Inject 0.3 mLs (0.3 mg total) into the muscle once as needed for up to 1 dose (Tongue or throat swelling). (Patient not taking: Reported on 06/15/2019)  . naproxen (NAPROSYN) 500 MG tablet Take 1 tablet (500 mg total) by mouth 2 (two) times daily with a meal. (Patient not taking: Reported on 06/15/2019)  . testosterone (ANDROGEL) 50 MG/5GM (1%) GEL PLACE 5 GRAMS ONTO THE SKIN DAILY (Patient not taking: Reported on 06/15/2019)     Past Surgical History:  Procedure Laterality Date  . KNEE ARTHROSCOPY W/ MENISCECTOMY  11/23/05   left knee - extensive bucket handle tear of medial meniscus  . SHOULDER ARTHROSCOPY W/ BANKART PROCEDURE  08/10/06  . TONSILLECTOMY  09/16/95    Family History  Problem Relation Age of Onset  . Heart attack Maternal Grandfather   . Diabetes Paternal Grandmother   . Diabetes Paternal Grandfather   . Hyperlipidemia Paternal Grandfather   . Hypertension Paternal Grandfather   . Heart disease Paternal Grandfather   . Cancer Maternal Aunt        breast    New complaints: None today  Social history: Lives in his parents basement  Controlled substance contract: 06/15/19    Review of Systems  Constitutional: Negative for diaphoresis.  Eyes: Negative for pain.  Respiratory: Negative for shortness of breath.   Cardiovascular: Negative for chest pain, palpitations and leg swelling.  Gastrointestinal: Negative for abdominal pain.  Endocrine: Negative for polydipsia.  Skin: Negative for rash.  Neurological: Negative for dizziness, weakness and headaches.  Hematological: Does not bruise/bleed easily.  All other systems reviewed and are negative.      Objective:   Physical Exam Vitals and nursing note reviewed.  Constitutional:      Appearance: Normal appearance. He is well-developed.  HENT:     Head: Normocephalic.     Nose:  Nose normal.  Eyes:     Pupils: Pupils are equal, round, and reactive to light.  Neck:     Thyroid: No thyroid mass or thyromegaly.     Vascular: No carotid bruit or JVD.     Trachea: Phonation normal.  Cardiovascular:     Rate and Rhythm: Normal rate and regular rhythm.  Pulmonary:     Effort: Pulmonary effort is normal. No respiratory distress.     Breath sounds: Normal breath sounds.  Abdominal:     General: Bowel sounds are normal.     Palpations: Abdomen is soft.     Tenderness: There is no abdominal tenderness.  Musculoskeletal:        General: Normal range of motion.     Cervical back: Normal range of motion and neck supple.  Lymphadenopathy:     Cervical: No cervical adenopathy.  Skin:    General: Skin is warm and dry.  Neurological:     Mental Status: He is alert and oriented to person, place, and time.  Psychiatric:        Behavior: Behavior normal.        Thought Content: Thought content normal.        Judgment: Judgment normal.    BP 121/82   Pulse 85   Temp 99.3 F (37.4 C) (Temporal)   Resp 20   Ht 5\' 8"  (1.727 m)   Wt 174 lb (78.9 kg)   SpO2 98%   BMI 26.46 kg/m         Assessment & Plan:  Charles [name] in today with chief complaint of Medical Management of Chronic Issues   1. Attention deficit hyperactivity disorder (ADHD), combined type - amphetamine-dextroamphetamine (ADDERALL XR) 30 MG 24 hr capsule; Take 1 capsule (30 mg total) by mouth every morning.  Dispense: 30 capsule; Refill: 0 - amphetamine-dextroamphetamine (ADDERALL XR) 30 MG 24 hr capsule; Take 1 capsule (30 mg total) by mouth every morning.  Dispense: 30 capsule; Refill: 0 - amphetamine-dextroamphetamine (ADDERALL XR) 30 MG 24 hr capsule; Take 1 capsule (30 mg total) by mouth every morning.  Dispense: 30 capsule; Refill: 0  2. Recurrent major depressive disorder, in partial remission ([facility]) Stress management - ARIPiprazole (ABILIFY) 10 MG tablet; Take 1 tablet (10 mg total) by  mouth daily.  Dispense: 30 tablet; Refill: 5 - citalopram (CELEXA) 40 MG tablet; Take 1 tablet (40 mg total) by mouth daily.  Dispense: 90 tablet; Refill: 1    The above assessment and management plan was discussed with the patient. The patient verbalized understanding of and has agreed to the management plan. Patient is aware to call the clinic if symptoms persist or worsen. Patient is aware when to return to the clinic for a follow-up visit. Patient educated on when it is appropriate to go to the emergency department.   Mary-Margaret Hassell Done, FNP

## 2019-09-14 ENCOUNTER — Ambulatory Visit: Payer: Self-pay | Admitting: Nurse Practitioner

## 2019-09-16 ENCOUNTER — Other Ambulatory Visit: Payer: Self-pay

## 2019-09-16 ENCOUNTER — Encounter: Payer: Self-pay | Admitting: Nurse Practitioner

## 2019-09-16 ENCOUNTER — Ambulatory Visit (INDEPENDENT_AMBULATORY_CARE_PROVIDER_SITE_OTHER): Payer: Self-pay | Admitting: Nurse Practitioner

## 2019-09-16 DIAGNOSIS — F902 Attention-deficit hyperactivity disorder, combined type: Secondary | ICD-10-CM

## 2019-09-16 MED ORDER — AMPHETAMINE-DEXTROAMPHET ER 30 MG PO CP24: 30.0000 mg | ORAL_CAPSULE | ORAL | 0 refills | Status: DC

## 2019-09-16 NOTE — Patient Instructions (Signed)
Stress, Adult Stress is a normal reaction to life events. Stress is what you feel when life demands more than you are used to, or more than you think you can handle. Some stress can be useful, such as studying for a test or meeting a deadline at work. Stress that occurs too often or for too long can cause problems. It can affect your emotional health and interfere with relationships and normal daily activities. Too much stress can weaken your body's defense system (immune system) and increase your risk for physical illness. If you already have a medical problem, stress can make it worse. What are the causes? All sorts of life events can cause stress. An event that causes stress for one person may not be stressful for another person. Major life events, whether positive or negative, commonly cause stress. Examples include:  Losing a job or starting a new job.  Losing a loved one.  Moving to a new town or home.  Getting married or divorced.  Having a baby.  Getting injured or sick. Less obvious life events can also cause stress, especially if they occur day after day or in combination with each other. Examples include:  Working long hours.  Driving in traffic.  Caring for children.  Being in debt.  Being in a difficult relationship. What are the signs or symptoms? Stress can cause emotional symptoms, including:  Anxiety. This is feeling worried, afraid, on edge, overwhelmed, or out of control.  Anger, including irritation or impatience.  Depression. This is feeling sad, down, helpless, or guilty.  Trouble focusing, remembering, or making decisions. Stress can cause physical symptoms, including:  Aches and pains. These may affect your head, neck, back, stomach, or other areas of your body.  Tight muscles or a clenched jaw.  Low energy.  Trouble sleeping. Stress can cause unhealthy behaviors, including:  Eating to feel better (overeating) or skipping meals.  Working too  much or putting off tasks.  Smoking, drinking alcohol, or using drugs to feel better. How is this diagnosed? Stress is diagnosed through an assessment by your health care provider. He or she may diagnose this condition based on:  Your symptoms and any stressful life events.  Your medical history.  Tests to rule out other causes of your symptoms. Depending on your condition, your health care provider may refer you to a specialist for further evaluation. How is this treated?  Stress management techniques are the recommended treatment for stress. Medicine is not typically recommended for the treatment of stress. Techniques to reduce your reaction to stressful life events include:  Stress identification. Monitor yourself for symptoms of stress and identify what causes stress for you. These skills may help you to avoid or prepare for stressful events.  Time management. Set your priorities, keep a calendar of events, and learn to say no. Taking these actions can help you avoid making too many commitments. Techniques for coping with stress include:  Rethinking the problem. Try to think realistically about stressful events rather than ignoring them or overreacting. Try to find the positives in a stressful situation rather than focusing on the negatives.  Exercise. Physical exercise can release both physical and emotional tension. The key is to find a form of exercise that you enjoy and do it regularly.  Relaxation techniques. These relax the body and mind. The key is to find one or more that you enjoy and use the techniques regularly. Examples include: ? Meditation, deep breathing, or progressive relaxation techniques. ? Yoga or   tai chi. ? Biofeedback, mindfulness techniques, or journaling. ? Listening to music, being out in nature, or participating in other hobbies.  Practicing a healthy lifestyle. Eat a balanced diet, drink plenty of water, limit or avoid caffeine, and get plenty of  sleep.  Having a strong support network. Spend time with family, friends, or other people you enjoy being around. Express your feelings and talk things over with someone you trust. Counseling or talk therapy with a mental health professional may be helpful if you are having trouble managing stress on your own. Follow these instructions at home: Lifestyle   Avoid drugs.  Do not use any products that contain nicotine or tobacco, such as cigarettes, e-cigarettes, and chewing tobacco. If you need help quitting, ask your health care provider.  Limit alcohol intake to no more than 1 drink a day for nonpregnant women and 2 drinks a day for men. One drink equals 12 oz of beer, 5 oz of wine, or 1 oz of hard liquor  Do not use alcohol or drugs to relax.  Eat a balanced diet that includes fresh fruits and vegetables, whole grains, lean meats, fish, eggs, and beans, and low-fat dairy. Avoid processed foods and foods high in added fat, sugar, and salt.  Exercise at least 30 minutes on 5 or more days each week.  Get 7-8 hours of sleep each night. General instructions   Practice stress management techniques as discussed with your health care provider.  Drink enough fluid to keep your urine clear or pale yellow.  Take over-the-counter and prescription medicines only as told by your health care provider.  Keep all follow-up visits as told by your health care provider. This is important. Contact a health care provider if:  Your symptoms get worse.  You have new symptoms.  You feel overwhelmed by your problems and can no longer manage them on your own. Get help right away if:  You have thoughts of hurting yourself or others. If you ever feel like you may hurt yourself or others, or have thoughts about taking your own life, get help right away. You can go to your nearest emergency department or call:  Your local emergency services (911 in the [place]  A suicide crisis helpline, such as the  [place] at (316) 250-6172. This is open 24 hours a day. Summary  Stress is a normal reaction to life events. It can cause problems if it happens too often or for too long.  Practicing stress management techniques is the best way to treat stress.  Counseling or talk therapy with a mental health professional may be helpful if you are having trouble managing stress on your own. This information is not intended to replace advice given to you by your health care provider. Make sure you discuss any questions you have with your health care provider. Document Revised: 09/25/2018 Document Reviewed: 04/17/2016 Elsevier Patient Education  [place].

## 2019-09-16 NOTE — Progress Notes (Signed)
Subjective:    Patient ID: Charles [name], male    DOB: 08-31-1990, 31 y.o.   MRN: [id number]   Chief Complaint: Medical Management of Chronic Issues   HPI No particular problems to note. Is here for a refill for Adderall. States that the medication is working well and that he can tell a significantly positive difference between when he started taking it and now. Patient reports he is currently searching for a job and that the medication is also helping in this process as he is focused on completing applications and preparing for interviews. Reports liking the extended release because it maintains a normal level all day, rather than a "spike" at the time of taking the medication and a crash later. Reports having no difficulties with brain fog and no feelings of intoxication on this medication. Reports taking it regularly and does not avoid alcohol while on the medication.     Review of Systems  Constitutional: Negative.   HENT: Negative.   Eyes: Negative.   Respiratory: Negative.   Cardiovascular: Negative.   Gastrointestinal: Negative.   Endocrine: Negative.   Genitourinary: Negative.   Musculoskeletal: Negative.   Skin: Negative.   Allergic/Immunologic: Negative.   Neurological: Negative.   Hematological: Negative.   Psychiatric/Behavioral: Negative.        Objective:   Physical Exam Constitutional:      Appearance: Normal appearance. He is normal weight.  HENT:     Head: Normocephalic and atraumatic.     Right Ear: Tympanic membrane, ear canal and external ear normal.     Left Ear: Tympanic membrane, ear canal and external ear normal.     Nose: Nose normal.     Mouth/Throat:     Mouth: Mucous membranes are moist.     Pharynx: Oropharynx is clear.  Eyes:     Extraocular Movements: Extraocular movements intact.     Conjunctiva/sclera: Conjunctivae normal.     Pupils: Pupils are equal, round, and reactive to light.  Cardiovascular:     Rate and Rhythm: Normal rate and  regular rhythm.     Pulses: Normal pulses.     Heart sounds: Normal heart sounds.  Pulmonary:     Effort: Pulmonary effort is normal.     Breath sounds: Normal breath sounds.  Abdominal:     General: Abdomen is flat. Bowel sounds are normal.     Palpations: Abdomen is soft.  Genitourinary:    Penis: Normal.      Testes: Normal.     Prostate: Normal.     Rectum: Normal.  Musculoskeletal:        General: Normal range of motion.     Cervical back: Normal range of motion and neck supple.  Skin:    General: Skin is warm and dry.     Capillary Refill: Capillary refill takes less than 2 seconds.  Neurological:     General: No focal deficit present.     Mental Status: He is alert and oriented to person, place, and time. Mental status is at baseline.  Psychiatric:        Mood and Affect: Mood normal.        Behavior: Behavior normal.        Thought Content: Thought content normal.        Judgment: Judgment normal.    BP (!) 140/91   Pulse 69   Temp 98 F (36.7 C) (Temporal)   Resp 20   Ht 5\' 8"  (1.727 m)  Wt 177 lb (80.3 kg)   SpO2 97%   BMI 26.91 kg/m         Assessment & Plan:  [name] in today with chief complaint of Medical Management of Chronic Issues   1. Attention deficit hyperactivity disorder (ADHD), combined type Stress management - amphetamine-dextroamphetamine (ADDERALL XR) 30 MG 24 hr capsule; Take 1 capsule (30 mg total) by mouth every morning.  Dispense: 30 capsule; Refill: 0 - amphetamine-dextroamphetamine (ADDERALL XR) 30 MG 24 hr capsule; Take 1 capsule (30 mg total) by mouth every morning.  Dispense: 30 capsule; Refill: 0 - amphetamine-dextroamphetamine (ADDERALL XR) 30 MG 24 hr capsule; Take 1 capsule (30 mg total) by mouth every morning.  Dispense: 30 capsule; Refill: 0    The above assessment and management plan was discussed with the patient. The patient verbalized understanding of and has agreed to the management plan. Patient is aware to  call the clinic if symptoms persist or worsen. Patient is aware when to return to the clinic for a follow-up visit. Patient educated on when it is appropriate to go to the emergency department.   Mary-Margaret Hassell Done, FNP

## 2019-09-16 NOTE — Addendum Note (Signed)
Addended by: Chevis Pretty on: 09/16/2019 12:45 PM   Modules accepted: Level of Service

## 2019-12-14 ENCOUNTER — Other Ambulatory Visit: Payer: Self-pay

## 2019-12-14 ENCOUNTER — Ambulatory Visit (INDEPENDENT_AMBULATORY_CARE_PROVIDER_SITE_OTHER): Payer: Self-pay | Admitting: Nurse Practitioner

## 2019-12-14 ENCOUNTER — Encounter: Payer: Self-pay | Admitting: Nurse Practitioner

## 2019-12-14 VITALS — BP 150/91 | HR 79 | Temp 97.5°F | Resp 20 | Ht 68.0 in | Wt 183.0 lb

## 2019-12-14 DIAGNOSIS — F902 Attention-deficit hyperactivity disorder, combined type: Secondary | ICD-10-CM

## 2019-12-14 MED ORDER — AMPHETAMINE-DEXTROAMPHET ER 30 MG PO CP24: 30.0000 mg | ORAL_CAPSULE | ORAL | 0 refills | Status: DC

## 2019-12-14 NOTE — Progress Notes (Signed)
Subjective:    Patient ID: Charles [name], male    DOB: [birth year], 29 y.o.   MRN: [id number]   Chief Complaint: Recheck ADHD    HPI:  1. Attention deficit hyperactivity disorder (ADHD), combined type Patient come sin today for follow up of ADH meds. He as been on meds since he was in highschool. He is currently on adderalll 30mg  daily. He is also looking for a job and had an interview last week and he is waiting to hear back from them with an offer.    Outpatient Encounter Medications as of 12/14/2019  Medication Sig  . amphetamine-dextroamphetamine (ADDERALL XR) 30 MG 24 hr capsule Take 1 capsule (30 mg total) by mouth every morning.  . ARIPiprazole (ABILIFY) 10 MG tablet Take 1 tablet (10 mg total) by mouth daily.  . citalopram (CELEXA) 40 MG tablet Take 1 tablet (40 mg total) by mouth daily.  . diphenhydrAMINE (BENADRYL) 25 MG tablet Take 1 tablet (25 mg total) by mouth every 6 (six) hours as needed for itching.  Marland Kitchen EPINEPHrine 0.3 mg/0.3 mL IJ SOAJ injection Inject 0.3 mLs (0.3 mg total) into the muscle once as needed for up to 1 dose (Tongue or throat swelling).  . valACYclovir (VALTREX) 1000 MG tablet Take 1 tablet (1,000 mg total) by mouth 2 (two) times daily as needed. Prn as needed for fever blisters  . amphetamine-dextroamphetamine (ADDERALL XR) 30 MG 24 hr capsule Take 1 capsule (30 mg total) by mouth every morning.  Marland Kitchen amphetamine-dextroamphetamine (ADDERALL XR) 30 MG 24 hr capsule Take 1 capsule (30 mg total) by mouth every morning.  . testosterone (ANDROGEL) 50 MG/5GM (1%) GEL PLACE 5 GRAMS ONTO THE SKIN DAILY (Patient not taking: Reported on 12/14/2019)   No facility-administered encounter medications on file as of 12/14/2019.    Past Surgical History:  Procedure Laterality Date  . KNEE ARTHROSCOPY W/ MENISCECTOMY  11/23/05   left knee - extensive bucket handle tear of medial meniscus  . SHOULDER ARTHROSCOPY W/ BANKART PROCEDURE  08/10/06  . TONSILLECTOMY  09/16/95     Family History  Problem Relation Age of Onset  . Heart attack Maternal Grandfather   . Diabetes Paternal Grandmother   . Diabetes Paternal Grandfather   . Hyperlipidemia Paternal Grandfather   . Hypertension Paternal Grandfather   . Heart disease Paternal Grandfather   . Cancer Maternal Aunt        breast    New complaints: None today  Social history: Lives with his mom and dad  Controlled substance contract: 06/16/19    Review of Systems  Constitutional: Negative for diaphoresis.  Eyes: Negative for pain.  Respiratory: Negative for shortness of breath.   Cardiovascular: Negative for chest pain, palpitations and leg swelling.  Gastrointestinal: Negative for abdominal pain.  Endocrine: Negative for polydipsia.  Skin: Negative for rash.  Neurological: Negative for dizziness, weakness and headaches.  Hematological: Does not bruise/bleed easily.  All other systems reviewed and are negative.      Objective:   Physical Exam Vitals and nursing note reviewed.  Constitutional:      Appearance: Normal appearance.  Cardiovascular:     Rate and Rhythm: Normal rate and regular rhythm.     Pulses: Normal pulses.     Heart sounds: Normal heart sounds.  Pulmonary:     Breath sounds: Normal breath sounds.  Neurological:     General: No focal deficit present.     Mental Status: He is alert.  Psychiatric:  Mood and Affect: Mood normal.        Behavior: Behavior normal.     BP (!) 150/91   Pulse 79   Temp (!) 97.5 F (36.4 C) (Temporal)   Resp 20   Ht 5\' 8"  (1.727 m)   Wt 183 lb (83 kg)   SpO2 97%   BMI 27.83 kg/m        Assessment & Plan:  Charles [name] in today with chief complaint of Recheck ADHD   1. Attention deficit hyperactivity disorder (ADHD), combined type Continue stress management - amphetamine-dextroamphetamine (ADDERALL XR) 30 MG 24 hr capsule; Take 1 capsule (30 mg total) by mouth every morning.  Dispense: 30 capsule; Refill: 0 -  amphetamine-dextroamphetamine (ADDERALL XR) 30 MG 24 hr capsule; Take 1 capsule (30 mg total) by mouth every morning.  Dispense: 30 capsule; Refill: 0 - amphetamine-dextroamphetamine (ADDERALL XR) 30 MG 24 hr capsule; Take 1 capsule (30 mg total) by mouth every morning.  Dispense: 30 capsule; Refill: 0    The above assessment and management plan was discussed with the patient. The patient verbalized understanding of and has agreed to the management plan. Patient is aware to call the clinic if symptoms persist or worsen. Patient is aware when to return to the clinic for a follow-up visit. Patient educated on when it is appropriate to go to the emergency department.   Mary-Margaret Hassell Done, FNP

## 2020-01-03 ENCOUNTER — Other Ambulatory Visit: Payer: No Typology Code available for payment source

## 2020-01-03 DIAGNOSIS — Z20822 Contact with and (suspected) exposure to covid-19: Secondary | ICD-10-CM

## 2020-01-04 LAB — SARS-COV-2, NAA 2 DAY TAT

## 2020-01-04 LAB — SPECIMEN STATUS REPORT

## 2020-01-04 LAB — NOVEL CORONAVIRUS, NAA: SARS-CoV-2, NAA: DETECTED — AB

## 2020-01-05 ENCOUNTER — Telehealth: Payer: Self-pay | Admitting: Nurse Practitioner

## 2020-01-05 NOTE — Telephone Encounter (Signed)
Called to discuss with [name] about Covid symptoms and the use of casirivimab/imdevimab, a combination monoclonal antibody infusion for those with mild to moderate Covid symptoms and at a high risk of hospitalization.     Pt is qualified for this infusion at the [facility] infusion center due to co-morbid conditions (BMI>25) , however declines infusion at this time. Symptoms tier reviewed as well as criteria for ending isolation.  Symptoms reviewed that would warrant ED/Hospital evaluation. Preventative practices reviewed. Patient verbalized understanding. Patient advised to call back if he decides that he does want to get infusion. Callback number to the infusion center given. Patient advised to go to Urgent care or ED with severe symptoms. Last date he would be eligible for infusion is 01/09/20.   Patient Active Problem List   Diagnosis Date Noted  . Recurrent major depressive disorder, in partial remission ([place]) 01/28/2017  . Hypogonadism in male 01/28/2017  . Attention deficit hyperactivity disorder (ADHD), combined type 08/03/2014  . 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

## 2020-03-08 ENCOUNTER — Other Ambulatory Visit: Payer: No Typology Code available for payment source

## 2020-03-08 DIAGNOSIS — Z20822 Contact with and (suspected) exposure to covid-19: Secondary | ICD-10-CM

## 2020-03-09 ENCOUNTER — Other Ambulatory Visit: Payer: Self-pay

## 2020-03-09 DIAGNOSIS — Z1152 Encounter for screening for COVID-19: Secondary | ICD-10-CM

## 2020-03-09 LAB — NOVEL CORONAVIRUS, NAA

## 2020-03-09 NOTE — Progress Notes (Signed)
[id number] 

## 2020-03-10 LAB — SARS-COV-2, NAA 2 DAY TAT

## 2020-03-10 LAB — NOVEL CORONAVIRUS, NAA: SARS-CoV-2, NAA: NOT DETECTED

## 2020-03-13 ENCOUNTER — Encounter: Payer: Self-pay | Admitting: Nurse Practitioner

## 2020-03-13 ENCOUNTER — Telehealth (INDEPENDENT_AMBULATORY_CARE_PROVIDER_SITE_OTHER): Payer: Self-pay | Admitting: Nurse Practitioner

## 2020-03-13 DIAGNOSIS — F902 Attention-deficit hyperactivity disorder, combined type: Secondary | ICD-10-CM

## 2020-03-13 MED ORDER — AMPHETAMINE-DEXTROAMPHET ER 30 MG PO CP24: 30.0000 mg | ORAL_CAPSULE | ORAL | 0 refills | Status: DC

## 2020-03-13 MED ORDER — AMPHETAMINE-DEXTROAMPHET ER 30 MG PO CP24
30.0000 mg | ORAL_CAPSULE | ORAL | 0 refills | Status: DC
Start: 2020-03-24 — End: 2020-11-03

## 2020-03-13 NOTE — Progress Notes (Signed)
Virtual Visit via telephone Note Due to COVID-19 pandemic this visit was conducted virtually. This visit type was conducted due to national recommendations for restrictions regarding the COVID-19 Pandemic (e.g. social distancing, sheltering in place) in an effort to limit this patient's exposure and mitigate transmission in our community. All issues noted in this document were discussed and addressed.  A physical exam was not performed with this format.  I connected with Charles [name] on 03/13/20 at 12:05 by telephone and verified that I am speaking with the correct person using two identifiers. Charles [name] is currently located at home and no one is currently with him during visit. The provider, Mary-Margaret Daphine Deutscher, FNP is located in their office at time of visit.  I discussed the limitations, risks, security and privacy concerns of performing an evaluation and management service by telephone and the availability of in person appointments. I also discussed with the patient that there may be a patient responsible charge related to this service. The patient expressed understanding and agreed to proceed.  * suppose to video visit but not working  History and Present Illness:   Chief Complaint: ADHD   HPI Patient has been on ADDERALL xr 30MG . HE HAS NOT TAKEN IN THE last month because he was saving them to take when he starts new job and has insurance. He is doing well. denies any side effects from medication. He denies any problems =toady. Starts new job next week.   Review of Systems  Constitutional: Negative for diaphoresis and weight loss.  Eyes: Negative for blurred vision, double vision and pain.  Respiratory: Negative for shortness of breath.   Cardiovascular: Negative for chest pain, palpitations, orthopnea and leg swelling.  Gastrointestinal: Negative for abdominal pain.  Skin: Negative for rash.  Neurological: Negative for dizziness, sensory change, loss of consciousness,  weakness and headaches.  Endo/Heme/Allergies: Negative for polydipsia. Does not bruise/bleed easily.  Psychiatric/Behavioral: Negative for memory loss. The patient does not have insomnia.   All other systems reviewed and are negative.    Observations/Objective: *Alert and oriented- answers all questions appropriately No distress    Assessment and Plan: in today with chief complaint of ADHD   1. Attention deficit hyperactivity disorder (ADHD), combined type Continue stress management - amphetamine-dextroamphetamine (ADDERALL XR) 30 MG 24 hr capsule; Take 1 capsule (30 mg total) by mouth every morning.  Dispense: 30 capsule; Refill: 0 - amphetamine-dextroamphetamine (ADDERALL XR) 30 MG 24 hr capsule; Take 1 capsule (30 mg total) by mouth every morning.  Dispense: 30 capsule; Refill: 0 - amphetamine-dextroamphetamine (ADDERALL XR) 30 MG 24 hr capsule; Take 1 capsule (30 mg total) by mouth every morning.  Dispense: 30 capsule; Refill: 0    Follow Up Instructions: 3 months    I discussed the assessment and treatment plan with the patient. The patient was provided an opportunity to ask questions and all were answered. The patient agreed with the plan and demonstrated an understanding of the instructions.   The patient was advised to call back or seek an in-person evaluation if the symptoms worsen or if the condition fails to improve as anticipated.  The above assessment and management plan was discussed with the patient. The patient verbalized understanding of and has agreed to the management plan. Patient is aware to call the clinic if symptoms persist or worsen. Patient is aware when to return to the clinic for a follow-up visit. Patient educated on when it is appropriate to go to the emergency  department.   Time call ended:  12:17  I provided 12 minutes of non-face-to-face time during this encounter.    Mary-Margaret Hassell Done, FNP

## 2020-05-15 ENCOUNTER — Ambulatory Visit (INDEPENDENT_AMBULATORY_CARE_PROVIDER_SITE_OTHER): Payer: 59 | Admitting: Nurse Practitioner

## 2020-05-15 ENCOUNTER — Other Ambulatory Visit: Payer: Self-pay

## 2020-05-15 ENCOUNTER — Ambulatory Visit (INDEPENDENT_AMBULATORY_CARE_PROVIDER_SITE_OTHER): Payer: 59

## 2020-05-15 ENCOUNTER — Encounter: Payer: Self-pay | Admitting: Nurse Practitioner

## 2020-05-15 VITALS — BP 130/81 | HR 72 | Temp 98.1°F | Resp 20 | Ht 68.0 in | Wt 184.0 lb

## 2020-05-15 DIAGNOSIS — M79671 Pain in right foot: Secondary | ICD-10-CM

## 2020-05-15 MED ORDER — PREDNISONE 20 MG PO TABS: ORAL_TABLET | ORAL | 0 refills | Status: DC

## 2020-05-15 MED ORDER — METHYLPREDNISOLONE ACETATE 80 MG/ML IJ SUSP
80.0000 mg | Freq: Once | INTRAMUSCULAR | Status: AC
  Administered 2020-05-15: 80 mg via INTRAMUSCULAR

## 2020-05-15 NOTE — Patient Instructions (Signed)

## 2020-05-15 NOTE — Progress Notes (Signed)
   Subjective:    Patient ID: Charles [name], male    DOB: 29-Sep-1990, 30 y.o.   MRN: [id number]   Chief Complaint: Right ankle swollen   HPI Patient come s in c/o pain medially below the the right ankle. He woke up with it hurting this morning. The pain goes upward and forward the medial side of foot . Painful to walk on. This occurred in early January and hurt for about 3 days and went away. Woke up this moring with the same pain. He denies any injury.   Review of Systems  Constitutional: Negative.   Respiratory: Negative.   Cardiovascular: Negative.   Neurological: Negative.   Psychiatric/Behavioral: Negative.   All other systems reviewed and are negative.      Objective:   Physical Exam Nursing note reviewed.  Constitutional:      Appearance: Normal appearance.  Cardiovascular:     Rate and Rhythm: Normal rate and regular rhythm.     Heart sounds: Normal heart sounds.  Musculoskeletal:     Comments: Pain on heel to deep palpation.  Mild edema just below medial malleolus with tenderness Pain with flexion and eversion of foot.  Skin:    General: Skin is warm.  Neurological:     General: No focal deficit present.     Mental Status: He is alert and oriented to person, place, and time.  Psychiatric:        Mood and Affect: Mood normal.        Behavior: Behavior normal.   BP 130/81   Pulse 72   Temp 98.1 F (36.7 C) (Temporal)   Resp 20   Ht 5\' 8"  (1.727 m)   Wt 184 lb (83.5 kg)   SpO2 98%   BMI 27.98 kg/m          Assessment & Plan:  Charles [name] in today with chief complaint of Right ankle swollen   1. Pain of right heel ICE Elevate RTO prn - DG Foot Complete Right - methylPREDNISolone acetate (DEPO-MEDROL) injection 80 mg - predniSONE (DELTASONE) 20 MG tablet; 2 po at sametime daily for 5 days-  Dispense: 10 tablet; Refill: 0    The above assessment and management plan was discussed with the patient. The patient verbalized understanding of and  has agreed to the management plan. Patient is aware to call the clinic if symptoms persist or worsen. Patient is aware when to return to the clinic for a follow-up visit. Patient educated on when it is appropriate to go to the emergency department.   Mary-Margaret Hassell Done, FNP

## 2020-06-15 ENCOUNTER — Ambulatory Visit: Payer: 59 | Admitting: Nurse Practitioner

## 2020-06-15 ENCOUNTER — Encounter: Payer: Self-pay | Admitting: Nurse Practitioner

## 2020-06-15 ENCOUNTER — Other Ambulatory Visit: Payer: Self-pay

## 2020-06-15 VITALS — BP 159/106 | HR 90 | Temp 98.7°F | Resp 20 | Ht 68.0 in | Wt 181.0 lb

## 2020-06-15 DIAGNOSIS — M79671 Pain in right foot: Secondary | ICD-10-CM

## 2020-06-15 DIAGNOSIS — M79672 Pain in left foot: Secondary | ICD-10-CM | POA: Diagnosis not present

## 2020-06-15 NOTE — Patient Instructions (Signed)
Gout  Gout is painful swelling of your joints. Gout is a type of arthritis. It is caused by having too much uric acid in your body. Uric acid is a chemical that is made when your body breaks down substances called purines. If your body has too much uric acid, sharp crystals can form and build up in your joints. This causes pain and swelling. Gout attacks can happen quickly and be very painful (acute gout). Over time, the attacks can affect more joints and happen more often (chronic gout). What are the causes?  Too much uric acid in your blood. This can happen because: ? Your kidneys do not remove enough uric acid from your blood. ? Your body makes too much uric acid. ? You eat too many foods that are high in purines. These foods include organ meats, some seafood, and beer.  Trauma or stress. What increases the risk?  Having a family history of gout.  Being male and middle-aged.  Being male and having gone through menopause.  Being very overweight (obese).  Drinking alcohol, especially beer.  Not having enough water in the body (being dehydrated).  Losing weight too quickly.  Having an organ transplant.  Having lead poisoning.  Taking certain medicines.  Having kidney disease.  Having a skin condition called psoriasis. What are the signs or symptoms? An attack of acute gout usually happens in just one joint. The most common place is the big toe. Attacks often start at night. Other joints that may be affected include joints of the feet, ankle, knee, fingers, wrist, or elbow. Symptoms of an attack may include:  Very bad pain.  Warmth.  Swelling.  Stiffness.  Shiny, red, or purple skin.  Tenderness. The affected joint may be very painful to touch.  Chills and fever. Chronic gout may cause symptoms more often. More joints may be involved. You may also have white or yellow lumps (tophi) on your hands or feet or in other areas near your joints.   How is this  treated?  Treatment for this condition has two phases: treating an acute attack and preventing future attacks.  Acute gout treatment may include: ? NSAIDs. ? Steroids. These are taken by mouth or injected into a joint. ? Colchicine. This medicine relieves pain and swelling. It can be given by mouth or through an IV tube.  Preventive treatment may include: ? Taking small doses of NSAIDs or colchicine daily. ? Using a medicine that reduces uric acid levels in your blood. ? Making changes to your diet. You may need to see a food expert (dietitian) about what to eat and drink to prevent gout. Follow these instructions at home: During a gout attack  If told, put ice on the painful area: ? Put ice in a plastic bag. ? Place a towel between your skin and the bag. ? Leave the ice on for 20 minutes, 2-3 times a day.  Raise (elevate) the painful joint above the level of your heart as often as you can.  Rest the joint as much as possible. If the joint is in your leg, you may be given crutches.  Follow instructions from your doctor about what you cannot eat or drink.   Avoiding future gout attacks  Eat a low-purine diet. Avoid foods and drinks such as: ? Liver. ? Kidney. ? Anchovies. ? Asparagus. ? Herring. ? Mushrooms. ? Mussels. ? Beer.  Stay at a healthy weight. If you want to lose weight, talk with your doctor. Do   not lose weight too fast.  Start or continue an exercise plan as told by your doctor. Eating and drinking  Drink enough fluids to keep your pee (urine) pale yellow.  If you drink alcohol: ? Limit how much you use to:  0-1 drink a day for women.  0-2 drinks a day for men. ? Be aware of how much alcohol is in your drink. In the [place] one drink equals one 12 oz bottle of beer (355 mL), one 5 oz glass of wine (148 mL), or one 1 oz glass of hard liquor (44 mL). General instructions  Take over-the-counter and prescription medicines only as told by your doctor.  Do  not drive or use heavy machinery while taking prescription pain medicine.  Return to your normal activities as told by your doctor. Ask your doctor what activities are safe for you.  Keep all follow-up visits as told by your doctor. This is important. Contact a doctor if:  You have another gout attack.  You still have symptoms of a gout attack after 10 days of treatment.  You have problems (side effects) because of your medicines.  You have chills or a fever.  You have burning pain when you pee (urinate).  You have pain in your lower back or belly. Get help right away if:  You have very bad pain.  Your pain cannot be controlled.  You cannot pee. Summary  Gout is painful swelling of the joints.  The most common site of pain is the big toe, but it can affect other joints.  Medicines and avoiding some foods can help to prevent and treat gout attacks. This information is not intended to replace advice given to you by your health care provider. Make sure you discuss any questions you have with your health care provider. Document Revised: 09/17/2017 Document Reviewed: 09/17/2017 Elsevier Patient Education  2021 [employer] Inc[employer]  

## 2020-06-15 NOTE — Progress Notes (Signed)
   Subjective:    Patient ID: Charles [name], male    DOB: August 24, 1990, 30 y.o.   MRN: [id number]   Chief Complaint: foot pain  HPI Patient comes in c/o of intermittent foot pain. He went to the urgent care in [place] last week and they told him he either thad gout or blood clot. A blood clot does not come and go. He is here today wanting blood work. He says his foot is not hurting today.   Review of Systems  Constitutional: Negative for diaphoresis.  Eyes: Negative for pain.  Respiratory: Negative for shortness of breath.   Cardiovascular: Negative for chest pain, palpitations and leg swelling.  Gastrointestinal: Negative for abdominal pain.  Endocrine: Negative for polydipsia.  Skin: Negative for rash.  Neurological: Negative for dizziness, weakness and headaches.  Hematological: Does not bruise/bleed easily.  All other systems reviewed and are negative.      Objective:   Physical Exam Vitals and nursing note reviewed.  Constitutional:      Appearance: Normal appearance.  Cardiovascular:     Rate and Rhythm: Normal rate and regular rhythm.     Heart sounds: Normal heart sounds.  Pulmonary:     Breath sounds: Normal breath sounds.  Neurological:     General: No focal deficit present.     Mental Status: He is alert and oriented to person, place, and time.    BP (!) 159/106   Pulse 90   Temp 98.7 F (37.1 C) (Temporal)   Resp 20   Ht 5\' 8"  (1.727 m)   Wt 181 lb (82.1 kg)   SpO2 97%   BMI 27.52 kg/m        Assessment & Plan:  Charles [name] in today with chief complaint of No chief complaint on file.   1. Pain in both feet Labs pending - Arthritis Panel    The above assessment and management plan was discussed with the patient. The patient verbalized understanding of and has agreed to the management plan. Patient is aware to call the clinic if symptoms persist or worsen. Patient is aware when to return to the clinic for a follow-up visit. Patient educated  on when it is appropriate to go to the emergency department.   Mary-Margaret Hassell Done, FNP

## 2020-06-16 LAB — ARTHRITIS PANEL
Basophils Absolute: 0 10*3/uL (ref 0.0–0.2)
Basos: 0 %
EOS (ABSOLUTE): 0 10*3/uL (ref 0.0–0.4)
Eos: 0 %
Hematocrit: 48.9 % (ref 37.5–51.0)
Hemoglobin: 17.2 g/dL (ref 13.0–17.7)
Immature Grans (Abs): 0 10*3/uL (ref 0.0–0.1)
Immature Granulocytes: 1 %
Lymphocytes Absolute: 0.7 10*3/uL (ref 0.7–3.1)
Lymphs: 9 %
MCH: 31.5 pg (ref 26.6–33.0)
MCHC: 35.2 g/dL (ref 31.5–35.7)
MCV: 90 fL (ref 79–97)
Monocytes Absolute: 0.2 10*3/uL (ref 0.1–0.9)
Monocytes: 3 %
Neutrophils Absolute: 7.4 10*3/uL — ABNORMAL HIGH (ref 1.4–7.0)
Neutrophils: 87 %
Platelets: 305 10*3/uL (ref 150–450)
RBC: 5.46 x10E6/uL (ref 4.14–5.80)
RDW: 12 % (ref 11.6–15.4)
Rheumatoid fact SerPl-aCnc: 10.4 IU/mL (ref <14.0)
Sed Rate: 3 mm/hr (ref 0–15)
Uric Acid: 7.1 mg/dL (ref 3.8–8.4)
WBC: 8.4 10*3/uL (ref 3.4–10.8)

## 2020-07-24 ENCOUNTER — Other Ambulatory Visit: Payer: Self-pay | Admitting: Nurse Practitioner

## 2020-07-24 DIAGNOSIS — F3341 Major depressive disorder, recurrent, in partial remission: Secondary | ICD-10-CM

## 2020-10-16 IMAGING — DX DG FOOT COMPLETE 3+V*R*
3 series · 3 of 3 positions shown · non-contrast
Comparison: None.

CLINICAL DATA: 27-year-old male with a history of right foot pain
for 1 day

EXAM:
RIGHT FOOT COMPLETE - 3+ VIEW

[foot ap]
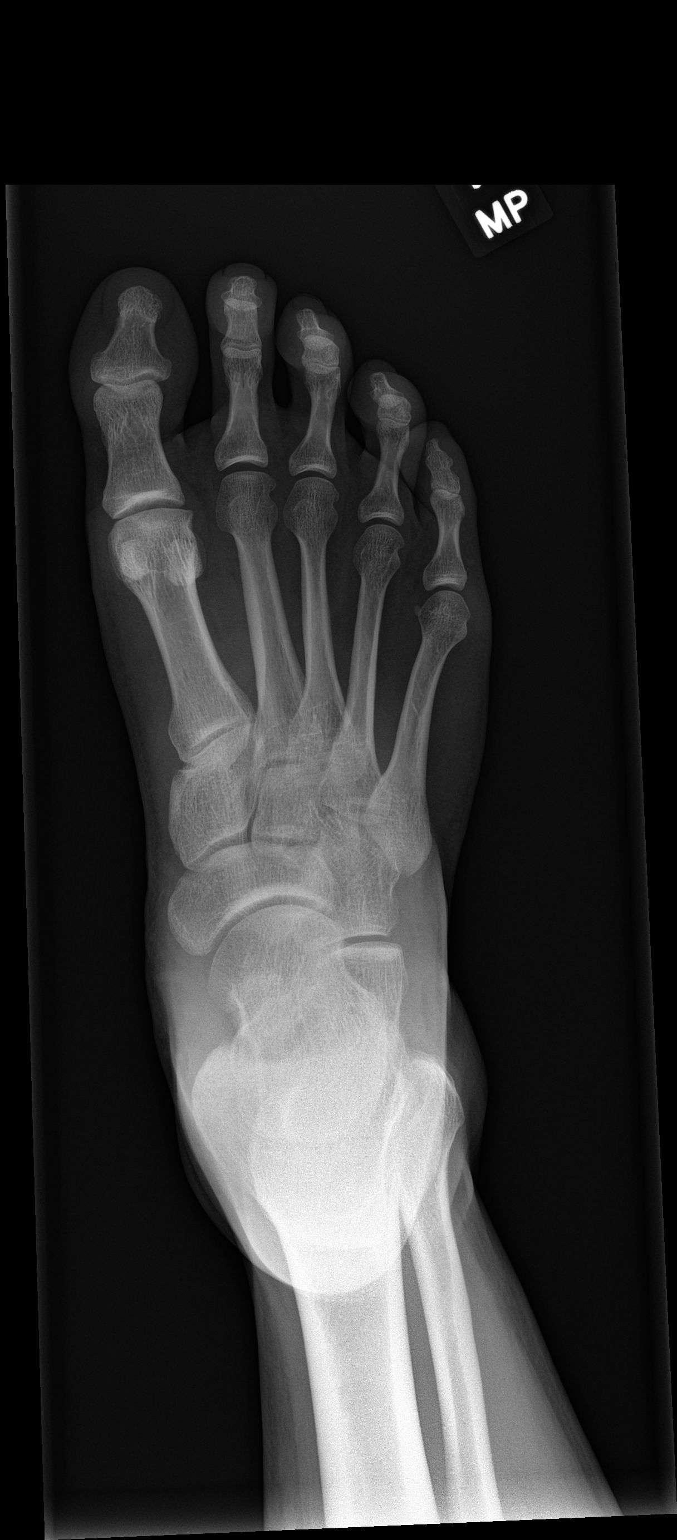

[foot obl]
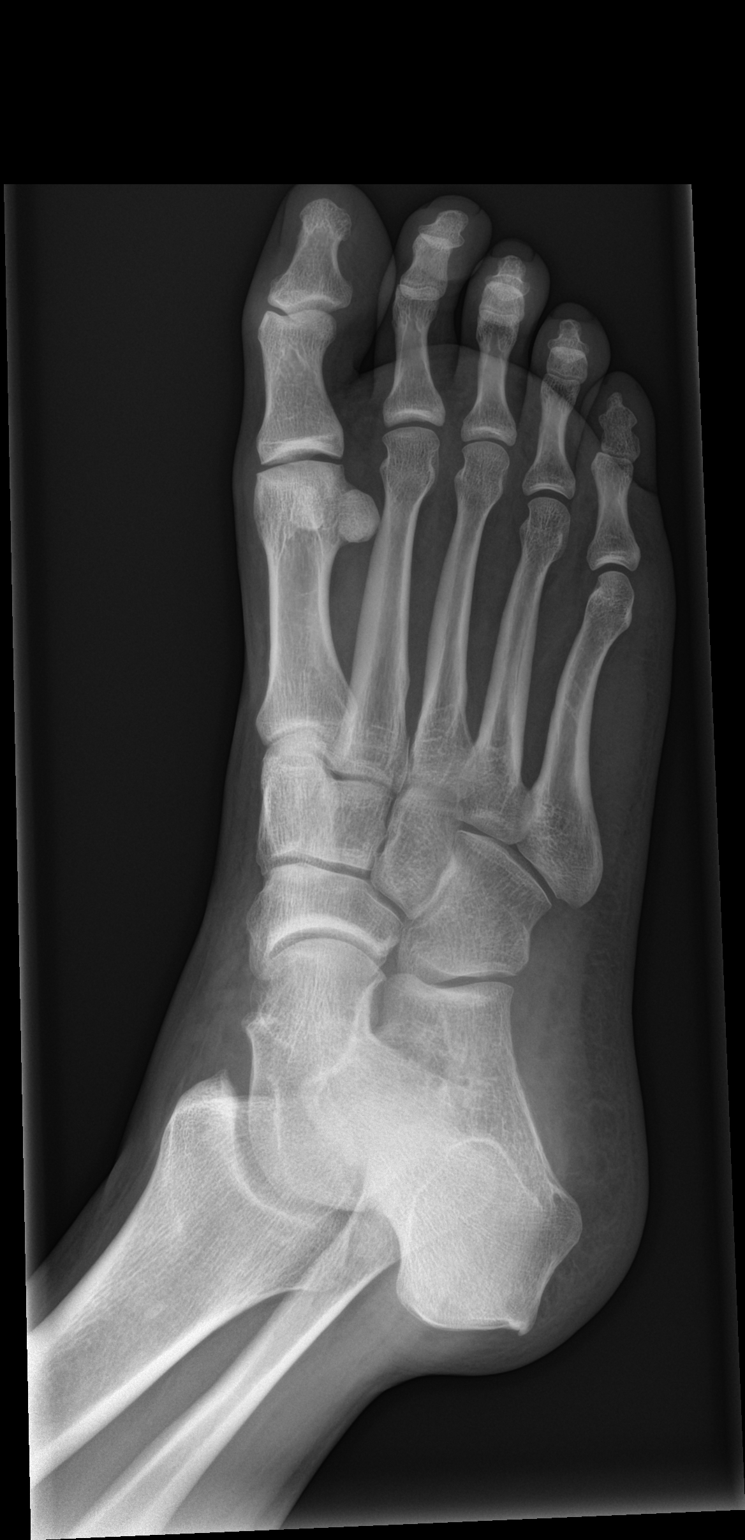

[foot lat]
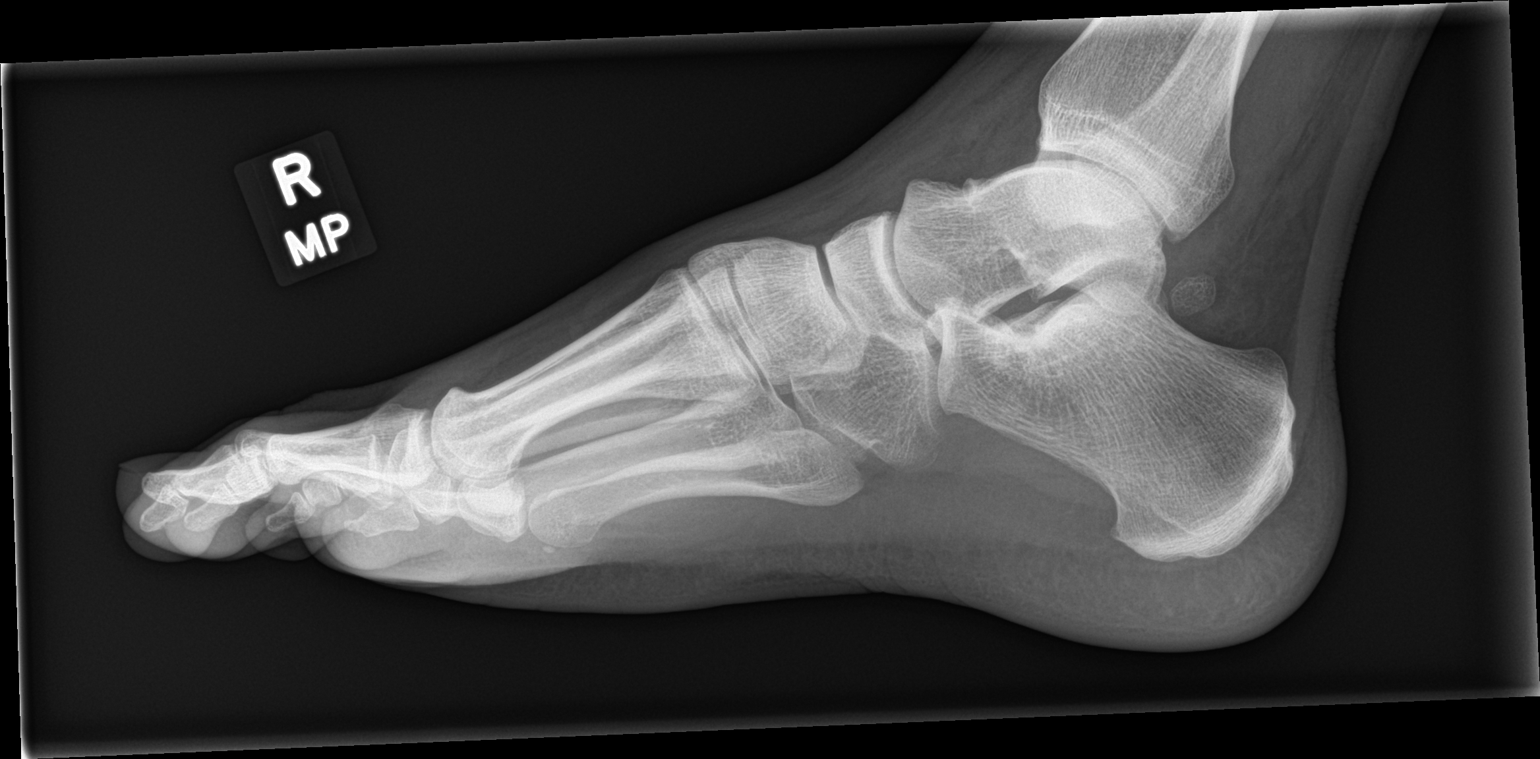

[3 of 3 positions shown; findings below may reference images not displayed]

FINDINGS: No acute displaced fracture. No radiopaque foreign body. No focal
soft tissue swelling. No subluxation/dislocation.
IMPRESSION: Negative plain film

## 2020-11-03 ENCOUNTER — Encounter: Payer: Self-pay | Admitting: Nurse Practitioner

## 2020-11-03 ENCOUNTER — Other Ambulatory Visit: Payer: Self-pay

## 2020-11-03 ENCOUNTER — Ambulatory Visit: Payer: 59 | Admitting: Nurse Practitioner

## 2020-11-03 VITALS — BP 148/91 | HR 71 | Temp 97.6°F | Resp 20 | Ht 68.0 in | Wt 179.0 lb

## 2020-11-03 DIAGNOSIS — F902 Attention-deficit hyperactivity disorder, combined type: Secondary | ICD-10-CM

## 2020-11-03 DIAGNOSIS — F3341 Major depressive disorder, recurrent, in partial remission: Secondary | ICD-10-CM | POA: Diagnosis not present

## 2020-11-03 MED ORDER — AMPHETAMINE-DEXTROAMPHET ER 30 MG PO CP24: 30.0000 mg | ORAL_CAPSULE | ORAL | 0 refills | Status: DC

## 2020-11-03 MED ORDER — ARIPIPRAZOLE 10 MG PO TABS: 10.0000 mg | ORAL_TABLET | Freq: Every day | ORAL | 1 refills | Status: DC

## 2020-11-03 MED ORDER — CITALOPRAM HYDROBROMIDE 40 MG PO TABS: 40.0000 mg | ORAL_TABLET | Freq: Every day | ORAL | 1 refills | Status: DC

## 2020-11-03 NOTE — Progress Notes (Signed)
Subjective:    Patient ID: Charles [name], male    DOB: [birth year], 30 y.o.   MRN: [id number]   Chief Complaint: Medical Management of Chronic Issues    HPI:  1. Attention deficit hyperactivity disorder (ADHD), combined type Patient has been on adderallXR '30mg'$  daily. He has ben on meds since early teens. He is not able to concentrate and get work done. He has no side effects from meds.  2. Recurrent major depressive disorder, in partial remission (Charles [name]) Is on combination of celexa and abilify. Is under good control and would like to continue same meds. Depression screen Charles [name] 2/9 11/03/2020 05/15/2020 12/14/2019  Decreased Interest 1 0 0  Down, Depressed, Hopeless 0 0 0  PHQ - 2 Score 1 0 0  Altered sleeping 1 - 0  Tired, decreased energy 1 - 0  Change in appetite 0 - 0  Feeling bad or failure about yourself  0 - 0  Trouble concentrating 0 - 0  Moving slowly or fidgety/restless 0 - 0  Suicidal thoughts 0 - 0  PHQ-9 Score 3 - 0  Difficult doing work/chores Somewhat difficult - Not difficult at all  Some recent data might be hidden       Outpatient Encounter Medications as of 11/03/2020  Medication Sig   ARIPiprazole (ABILIFY) 10 MG tablet Take 1 tablet by mouth once daily   citalopram (CELEXA) 40 MG tablet Take 1 tablet by mouth once daily   diphenhydrAMINE (BENADRYL) 25 MG tablet Take 1 tablet (25 mg total) by mouth every 6 (six) hours as needed for itching.   EPINEPHrine 0.3 mg/0.3 mL IJ SOAJ injection Inject 0.3 mLs (0.3 mg total) into the muscle once as needed for up to 1 dose (Tongue or throat swelling).   valACYclovir (VALTREX) 1000 MG tablet Take 1 tablet (1,000 mg total) by mouth 2 (two) times daily as needed. Prn as needed for fever blisters   amphetamine-dextroamphetamine (ADDERALL XR) 30 MG 24 hr capsule Take 1 capsule (30 mg total) by mouth every morning.   amphetamine-dextroamphetamine (ADDERALL XR) 30 MG 24 hr capsule Take 1 capsule (30 mg total) by mouth every  morning.   amphetamine-dextroamphetamine (ADDERALL XR) 30 MG 24 hr capsule Take 1 capsule (30 mg total) by mouth every morning.   testosterone (ANDROGEL) 50 MG/5GM (1%) GEL PLACE 5 GRAMS ONTO THE SKIN DAILY (Patient not taking: Reported on 11/03/2020)   No facility-administered encounter medications on file as of 11/03/2020.    Past Surgical History:  Procedure Laterality Date   KNEE ARTHROSCOPY W/ MENISCECTOMY  11/23/05   left knee - extensive bucket handle tear of medial meniscus   SHOULDER ARTHROSCOPY W/ BANKART PROCEDURE  08/10/06   TONSILLECTOMY  09/16/95    Family History  Problem Relation Age of Onset   Heart attack Maternal Grandfather    Diabetes Paternal Grandmother    Diabetes Paternal Grandfather    Hyperlipidemia Paternal Grandfather    Hypertension Paternal Grandfather    Heart disease Paternal Grandfather    Cancer Maternal Aunt        breast    New complaints: None today  Social history: Lives with his parents but stay with his girlfriend in [place] , [place] most of the time.  Controlled substance contract: 11/03/20     Review of Systems  Constitutional:  Negative for diaphoresis.  Eyes:  Negative for pain.  Respiratory:  Negative for shortness of breath.   Cardiovascular:  Negative for chest pain, palpitations and leg swelling.  Gastrointestinal:  Negative for abdominal pain.  Endocrine: Negative for polydipsia.  Skin:  Negative for rash.  Neurological:  Negative for dizziness, weakness and headaches.  Hematological:  Does not bruise/bleed easily.  All other systems reviewed and are negative.     Objective:   Physical Exam Vitals and nursing note reviewed.  Constitutional:      Appearance: Normal appearance. He is well-developed.  HENT:     Head: Normocephalic.     Nose: Nose normal.  Eyes:     Pupils: Pupils are equal, round, and reactive to light.  Neck:     Thyroid: No thyroid mass or thyromegaly.     Vascular: No carotid bruit or JVD.      Trachea: Phonation normal.  Cardiovascular:     Rate and Rhythm: Normal rate and regular rhythm.  Pulmonary:     Effort: Pulmonary effort is normal. No respiratory distress.     Breath sounds: Normal breath sounds.  Abdominal:     General: Bowel sounds are normal.     Palpations: Abdomen is soft.     Tenderness: There is no abdominal tenderness.  Musculoskeletal:        General: Normal range of motion.     Cervical back: Normal range of motion and neck supple.  Lymphadenopathy:     Cervical: No cervical adenopathy.  Skin:    General: Skin is warm and dry.  Neurological:     Mental Status: He is alert and oriented to person, place, and time.  Psychiatric:        Behavior: Behavior normal.        Thought Content: Thought content normal.        Judgment: Judgment normal.   BP (!) 148/91   Pulse 71   Temp 97.6 F (36.4 C) (Temporal)   Resp 20   Ht '5\' 8"'$  (1.727 m)   Wt 179 lb (81.2 kg)   SpO2 96%   BMI 27.22 kg/m         Assessment & Plan:  Charles [name] in today with chief complaint of Medical Management of Chronic Issues   1. Attention deficit hyperactivity disorder (ADHD), combined type Stress management - amphetamine-dextroamphetamine (ADDERALL XR) 30 MG 24 hr capsule; Take 1 capsule (30 mg total) by mouth every morning.  Dispense: 30 capsule; Refill: 0 - amphetamine-dextroamphetamine (ADDERALL XR) 30 MG 24 hr capsule; Take 1 capsule (30 mg total) by mouth every morning.  Dispense: 30 capsule; Refill: 0 - amphetamine-dextroamphetamine (ADDERALL XR) 30 MG 24 hr capsule; Take 1 capsule (30 mg total) by mouth every morning.  Dispense: 30 capsule; Refill: 0  2. Recurrent major depressive disorder, in partial remission ([facility])  - citalopram (CELEXA) 40 MG tablet; Take 1 tablet (40 mg total) by mouth daily.  Dispense: 90 tablet; Refill: 1 - ARIPiprazole (ABILIFY) 10 MG tablet; Take 1 tablet (10 mg total) by mouth daily.  Dispense: 90 tablet; Refill: 1    The above  assessment and management plan was discussed with the patient. The patient verbalized understanding of and has agreed to the management plan. Patient is aware to call the clinic if symptoms persist or worsen. Patient is aware when to return to the clinic for a follow-up visit. Patient educated on when it is appropriate to go to the emergency department.   Mary-Margaret Hassell Done, FNP

## 2020-11-03 NOTE — Patient Instructions (Signed)
Textbook of family medicine (9th ed., pp. 1062-1073). [place], [place]: [employer].">  Stress, Adult Stress is a normal reaction to life events. Stress is what you feel when life demands more than you are used to, or more than you think you can handle. Some stress can be useful, such as studying for a test or meeting a deadline at work. Stress that occurs too often or for too long can cause problems. It can affect your emotional health and interfere with relationships and normal daily activities. Too much stress can weaken your body's defense system (immune system) and increase your risk for physical illness. If you already have a medicalproblem, stress can make it worse. What are the causes? All sorts of life events can cause stress. An event that causes stress for one person may not be stressful for another person. Major life events, whether positive or negative, commonly cause stress. Examples include: Losing a job or starting a new job. Losing a loved one. Moving to a new town or home. Getting married or divorced. Having a baby. Getting injured or sick. Less obvious life events can also cause stress, especially if they occur day after day or in combination with each other. Examples include: Working long hours. Driving in traffic. Caring for children. Being in debt. Being in a difficult relationship. What are the signs or symptoms? Stress can cause emotional symptoms, including: Anxiety. This is feeling worried, afraid, on edge, overwhelmed, or out of control. Anger, including irritation or impatience. Depression. This is feeling sad, down, helpless, or guilty. Trouble focusing, remembering, or making decisions. Stress can cause physical symptoms, including: Aches and pains. These may affect your head, neck, back, stomach, or other areas of your body. Tight muscles or a clenched jaw. Low energy. Trouble sleeping. Stress can cause unhealthy behaviors, including: Eating to feel better  (overeating) or skipping meals. Working too much or putting off tasks. Smoking, drinking alcohol, or using drugs to feel better. How is this diagnosed? Stress is diagnosed through an assessment by your health care provider. He or she may diagnose this condition based on: Your symptoms and any stressful life events. Your medical history. Tests to rule out other causes of your symptoms. Depending on your condition, your health care provider may refer you to aspecialist for further evaluation. How is this treated?  Stress management techniques are the recommended treatment for stress. Medicineis not typically recommended for the treatment of stress. Techniques to reduce your reaction to stressful life events include: Stress identification. Monitor yourself for symptoms of stress and identify what causes stress for you. These skills may help you to avoid or prepare for stressful events. Time management. Set your priorities, keep a calendar of events, and learn to say no. Taking these actions can help you avoid making too many commitments. Techniques for coping with stress include: Rethinking the problem. Try to think realistically about stressful events rather than ignoring them or overreacting. Try to find the positives in a stressful situation rather than focusing on the negatives. Exercise. Physical exercise can release both physical and emotional tension. The key is to find a form of exercise that you enjoy and do it regularly. Relaxation techniques. These relax the body and mind. The key is to find one or more that you enjoy and use the techniques regularly. Examples include: Meditation, deep breathing, or progressive relaxation techniques. Yoga or tai chi. Biofeedback, mindfulness techniques, or journaling. Listening to music, being out in nature, or participating in other hobbies. Practicing a healthy lifestyle.   Eat a balanced diet, drink plenty of water, limit or avoid caffeine, and get  plenty of sleep. Having a strong support network. Spend time with family, friends, or other people you enjoy being around. Express your feelings and talk things over with someone you trust. Counseling or talk therapy with a mental health professional may be helpful if you are havingtrouble managing stress on your own. Follow these instructions at home: Lifestyle  Avoid drugs. Do not use any products that contain nicotine or tobacco, such as cigarettes, e-cigarettes, and chewing tobacco. If you need help quitting, ask your health care provider. Limit alcohol intake to no more than 1 drink a day for nonpregnant women and 2 drinks a day for men. One drink equals 12 oz of beer, 5 oz of wine, or 1 oz of hard liquor Do not use alcohol or drugs to relax. Eat a balanced diet that includes fresh fruits and vegetables, whole grains, lean meats, fish, eggs, and beans, and low-fat dairy. Avoid processed foods and foods high in added fat, sugar, and salt. Exercise at least 30 minutes on 5 or more days each week. Get 7-8 hours of sleep each night.  General instructions  Practice stress management techniques as discussed with your health care provider. Drink enough fluid to keep your urine clear or pale yellow. Take over-the-counter and prescription medicines only as told by your health care provider. Keep all follow-up visits as told by your health care provider. This is important.  Contact a health care provider if: Your symptoms get worse. You have new symptoms. You feel overwhelmed by your problems and can no longer manage them on your own. Get help right away if: You have thoughts of hurting yourself or others. If you ever feel like you may hurt yourself or others, or have thoughts about taking your own life, get help right away. You can go to your nearest emergency department or call: Your local emergency services (911 in the [place] A suicide crisis helpline, such as the [facility] at 4253100524. This is open 24 hours a day. Summary Stress is a normal reaction to life events. It can cause problems if it happens too often or for too long. Practicing stress management techniques is the best way to treat stress. Counseling or talk therapy with a mental health professional may be helpful if you are having trouble managing stress on your own. This information is not intended to replace advice given to you by your health care provider. Make sure you discuss any questions you have with your healthcare provider. Document Revised: 11/12/2019 Document Reviewed: 11/12/2019 Elsevier Patient Education  2022 [employer].

## 2020-12-18 ENCOUNTER — Ambulatory Visit: Payer: 59 | Admitting: Nurse Practitioner

## 2020-12-18 ENCOUNTER — Encounter: Payer: Self-pay | Admitting: Nurse Practitioner

## 2020-12-18 ENCOUNTER — Ambulatory Visit (INDEPENDENT_AMBULATORY_CARE_PROVIDER_SITE_OTHER): Payer: 59

## 2020-12-18 ENCOUNTER — Other Ambulatory Visit: Payer: Self-pay

## 2020-12-18 VITALS — BP 150/87 | HR 97 | Temp 98.8°F | Ht 68.0 in | Wt 187.0 lb

## 2020-12-18 DIAGNOSIS — S8991XA Unspecified injury of right lower leg, initial encounter: Secondary | ICD-10-CM | POA: Diagnosis not present

## 2020-12-18 DIAGNOSIS — M25561 Pain in right knee: Secondary | ICD-10-CM | POA: Diagnosis not present

## 2020-12-18 MED ORDER — METHYLPREDNISOLONE ACETATE 40 MG/ML IJ SUSP
80.0000 mg | Freq: Once | INTRAMUSCULAR | Status: AC
  Administered 2020-12-18: 80 mg via INTRAMUSCULAR

## 2020-12-18 MED ORDER — INDOMETHACIN 50 MG PO CAPS: 50.0000 mg | ORAL_CAPSULE | Freq: Three times a day (TID) | ORAL | 1 refills | Status: DC

## 2020-12-18 NOTE — Progress Notes (Signed)
Acute Office Visit  Subjective:    Patient ID: Charles [name], male    DOB: 1990-08-23, 30 y.o.   MRN: [id number]  Chief Complaint  Patient presents with   Knee Pain    Knee Pain  The incident occurred 1 to 3 hours ago. The incident occurred at home. There was no injury mechanism. The pain is present in the right knee. The quality of the pain is described as shooting. The pain is at a severity of 6/10. The pain is moderate. The pain has been Constant since onset. Associated symptoms include numbness. He reports no foreign bodies present. He has tried nothing for the symptoms.     Past Medical History:  Diagnosis Date   ADD (attention deficit disorder) f   Allergic rhinitis    Gynecomastia, male     Past Surgical History:  Procedure Laterality Date   KNEE ARTHROSCOPY W/ MENISCECTOMY  11/23/05   left knee - extensive bucket handle tear of medial meniscus   SHOULDER ARTHROSCOPY W/ BANKART PROCEDURE  08/10/06   TONSILLECTOMY  09/16/95    Family History  Problem Relation Age of Onset   Heart attack Maternal Grandfather    Diabetes Paternal Grandmother    Diabetes Paternal Grandfather    Hyperlipidemia Paternal Grandfather    Hypertension Paternal Grandfather    Heart disease Paternal Grandfather    Cancer Maternal Aunt        breast    Social History   Socioeconomic History   Marital status: Single    Spouse name: Not on file   Number of children: Not on file   Years of education: Not on file   Highest education level: Not on file  Occupational History   Not on file  Tobacco Use   Smoking status: Never   Smokeless tobacco: Never  Substance and Sexual Activity   Alcohol use: Yes    Alcohol/week: 6.0 standard drinks    Types: 6 Cans of beer per week    Comment: occasional   Drug use: No   Sexual activity: Not on file  Other Topics Concern   Not on file  Social History Narrative   Not on file   Social Determinants of Health   Financial Resource Strain: Not  on file  Food Insecurity: Not on file  Transportation Needs: Not on file  Physical Activity: Not on file  Stress: Not on file  Social Connections: Not on file  Intimate Partner Violence: Not on file    Outpatient Medications Prior to Visit  Medication Sig Dispense Refill   [START ON 01/02/2021] amphetamine-dextroamphetamine (ADDERALL XR) 30 MG 24 hr capsule Take 1 capsule (30 mg total) by mouth every morning. 30 capsule 0   amphetamine-dextroamphetamine (ADDERALL XR) 30 MG 24 hr capsule Take 1 capsule (30 mg total) by mouth every morning. 30 capsule 0   ARIPiprazole (ABILIFY) 10 MG tablet Take 1 tablet (10 mg total) by mouth daily. 90 tablet 1   citalopram (CELEXA) 40 MG tablet Take 1 tablet (40 mg total) by mouth daily. 90 tablet 1   diphenhydrAMINE (BENADRYL) 25 MG tablet Take 1 tablet (25 mg total) by mouth every 6 (six) hours as needed for itching.     EPINEPHrine 0.3 mg/0.3 mL IJ SOAJ injection Inject 0.3 mLs (0.3 mg total) into the muscle once as needed for up to 1 dose (Tongue or throat swelling). 1 Device 1   testosterone (ANDROGEL) 50 MG/5GM (1%) GEL PLACE 5 GRAMS ONTO THE SKIN DAILY  5 g 5   valACYclovir (VALTREX) 1000 MG tablet Take 1 tablet (1,000 mg total) by mouth 2 (two) times daily as needed. Prn as needed for fever blisters 12 tablet 1   amphetamine-dextroamphetamine (ADDERALL XR) 30 MG 24 hr capsule Take 1 capsule (30 mg total) by mouth every morning. 30 capsule 0   No facility-administered medications prior to visit.    Allergies  Allergen Reactions   Lamictal [Lamotrigine] Other (See Comments)    "Muscle Spasms in back"    Review of Systems  Constitutional: Negative.   HENT: Negative.    Musculoskeletal:  Positive for joint swelling.  Skin:  Negative for rash.  Neurological:  Positive for numbness.  All other systems reviewed and are negative.     Objective:    Physical Exam Vitals reviewed.  Constitutional:      Appearance: Normal appearance.  HENT:      Head: Normocephalic.     Right Ear: External ear normal.     Left Ear: External ear normal.     Nose: Nose normal.  Eyes:     Conjunctiva/sclera: Conjunctivae normal.  Cardiovascular:     Rate and Rhythm: Normal rate and regular rhythm.  Pulmonary:     Effort: Pulmonary effort is normal.     Breath sounds: Normal breath sounds.  Abdominal:     General: Bowel sounds are normal.  Musculoskeletal:     Right knee: Swelling present. Decreased range of motion. Tenderness present.  Neurological:     Mental Status: He is alert and oriented to person, place, and time.  Psychiatric:        Behavior: Behavior normal.    BP (!) 150/87   Pulse 97   Temp 98.8 F (37.1 C) (Temporal)   Ht 5\' 8"  (1.727 m)   Wt 187 lb (84.8 kg)   BMI 28.43 kg/m  Wt Readings from Last 3 Encounters:  12/18/20 187 lb (84.8 kg)  11/03/20 179 lb (81.2 kg)  06/15/20 181 lb (82.1 kg)    Health Maintenance Due  Topic Date Due   HIV Screening  Never done   Hepatitis C Screening  Never done   TETANUS/TDAP  08/26/2017   INFLUENZA VACCINE  10/09/2020    There are no preventive care reminders to display for this patient.   Lab Results  Component Value Date   TSH 2.428 04/17/2012   Lab Results  Component Value Date   WBC 8.4 06/15/2020   HGB 17.2 06/15/2020   HCT 48.9 06/15/2020   MCV 90 06/15/2020   PLT 305 06/15/2020   Lab Results  Component Value Date   NA 142 04/20/2012   K 4.0 04/20/2012   CO2 27 04/20/2012   GLUCOSE 89 04/20/2012   BUN 9 04/20/2012   CREATININE 0.95 04/20/2012   CALCIUM 9.9 04/20/2012   No results found for: CHOL      Assessment & Plan:   Problem List Items Addressed This Visit       Other   Injury of right knee - Primary    Knee pain not well controlled, patient, symptoms started in the last 24 hours.  Has a history of gout and takes indomethacin.  Provided education to patient that indomethacin has high interaction with Celexa.  Patient states he is aware and  will not take indomethacin and Celexa together. 80 mg of Depo-Medrol shot given in clinic.  Indomethacin as needed.  Completed right knee x-ray results pending.  Follow-up with worsening unresolved symptoms.  Relevant Orders   DG Knee 1-2 Views Right     Meds ordered this encounter  Medications   indomethacin (INDOCIN) 50 MG capsule    Sig: Take 1 capsule (50 mg total) by mouth 3 (three) times daily with meals.    Dispense:  30 capsule    Refill:  1    Order Specific Question:   Supervising Provider    Answer:   Janora Norlander [[id number]]     Ivy [name], NP

## 2020-12-18 NOTE — Patient Instructions (Signed)
Knee Sprain, Adult A knee sprain is a stretch or tear in a knee ligament. Knee ligaments are tissues that connect bones in the knee to each other. What are the causes? This condition often results from: A fall. An injury to the knee. What are the signs or symptoms? Symptoms of this condition include: Trouble straightening or bending the leg. Swelling in the knee. Bruising around the knee. Tenderness or pain in the knee. Muscle spasms around the knee. How is this diagnosed? This condition may be diagnosed based on: A physical exam. A history of what happened just before you started to have symptoms. Tests, including: An X-ray. This may be done to make sure no bones are broken. An MRI. This may be done to check if the ligament is torn. Stress testing of the knee. This may be done to check ligament damage. How is this treated? Treatment for this condition may involve: Keeping the knee still (immobilized) with a cast, brace, or splint. Applying ice to the knee. This helps with pain and swelling. Raising (elevating) the knee above the level of your heart when you are resting. This helps with pain and swelling. Taking medicine for pain. Doing exercises to prevent or limit permanent weakness or stiffness in your knee. Having surgery to reconnect the ligament to the bone or to reconstruct it. This may be needed if the ligament is completely torn. Follow these instructions at home: If you have a splint or brace: Wear it as told by your health care provider. Remove it only as told by your health care provider. Check the skin around it every day. Tell your health care provider about any concerns. Loosen it if your toes tingle, become numb, or turn cold and blue. Keep it clean and dry. If you have a cast: Do not stick anything inside it to scratch your skin. Doing that increases your risk of infection. Check the skin around it every day. Tell your health care provider about any  concerns. You may put lotion on dry skin around the edges of the cast. Do not put lotion on the skin underneath the cast. Keep it clean and dry. Bathing If you have a splint, brace, or cast that is not waterproof: Do not let it get wet. Cover it with a watertight covering when you take a bath or a shower. Managing pain, stiffness, and swelling  If directed, put ice on the injured area. To do this: If you have a removable splint or brace, remove it as told by your health care provider. Put ice in a plastic bag. Place a towel between your skin and the bag or between your cast and the bag. Leave the ice on for 20 minutes, 2-3 times a day. Move your toes often to reduce stiffness and swelling. Elevate the injured area above the level of your heart while you are sitting or lying down. General instructions Take over-the-counter and prescription medicines only as told by your health care provider. Do not use any products that contain nicotine or tobacco, such as cigarettes, e-cigarettes, and chewing tobacco. These can delay healing. If you need help quitting, ask your health care provider. Do exercises as told by your health care provider. Keep all follow-up visits as told by your health care provider. This is important. Contact a health care provider if: You have pain that gets worse. The cast, brace, or splint does not fit right. The cast, brace, or splint gets damaged. Get help right away if: You cannot use  your injured knee to support any of your body weight (cannot bear weight). You cannot move the injured joint. You cannot walk more than a few steps without pain or without your knee buckling. You have significant pain, swelling, or numbness in the leg below the cast, brace, or splint. Your foot or toes are numb, cold, or blue after loosening your splint or brace. Summary A knee sprain is a stretch or tear in a knee ligament that usually occurs as the result of a fall or  injury. Treatment may involve immobilizing the knee with a cast, splint, or brace and then doing exercises. If the ligament is completely torn, it may require surgery to repair or replace the injured ligament. This information is not intended to replace advice given to you by your health care provider. Make sure you discuss any questions you have with your health care provider. Document Revised: 01/15/2019 Document Reviewed: 01/15/2019 Elsevier Patient Education  [place].

## 2020-12-18 NOTE — Assessment & Plan Note (Signed)
Knee pain not well controlled, patient, symptoms started in the last 24 hours.  Has a history of gout and takes indomethacin.  Provided education to patient that indomethacin has high interaction with Celexa.  Patient states he is aware and will not take indomethacin and Celexa together. 80 mg of Depo-Medrol shot given in clinic.  Indomethacin as needed.  Completed right knee x-ray results pending.  Follow-up with worsening unresolved symptoms.

## 2020-12-18 NOTE — Addendum Note (Signed)
Addended byCarrolyn Leigh on: 12/18/2020 08:56 AM   Modules accepted: Orders

## 2020-12-19 ENCOUNTER — Ambulatory Visit: Payer: 59 | Admitting: Nurse Practitioner

## 2020-12-22 ENCOUNTER — Other Ambulatory Visit: Payer: Self-pay | Admitting: *Deleted

## 2020-12-22 MED ORDER — VALACYCLOVIR HCL 1 G PO TABS: 1000.0000 mg | ORAL_TABLET | Freq: Two times a day (BID) | ORAL | 1 refills | Status: AC | PRN

## 2021-01-05 ENCOUNTER — Encounter: Payer: Self-pay | Admitting: Nurse Practitioner

## 2021-01-05 ENCOUNTER — Other Ambulatory Visit: Payer: Self-pay

## 2021-01-05 ENCOUNTER — Ambulatory Visit: Payer: 59 | Admitting: Nurse Practitioner

## 2021-01-05 VITALS — BP 138/90 | HR 78 | Temp 97.9°F | Resp 20 | Ht 68.0 in | Wt 184.0 lb

## 2021-01-05 DIAGNOSIS — S8991XD Unspecified injury of right lower leg, subsequent encounter: Secondary | ICD-10-CM | POA: Diagnosis not present

## 2021-01-05 DIAGNOSIS — S8991XA Unspecified injury of right lower leg, initial encounter: Secondary | ICD-10-CM

## 2021-01-05 MED ORDER — PREDNISONE 10 MG (21) PO TBPK: ORAL_TABLET | ORAL | 0 refills | Status: DC

## 2021-01-05 NOTE — Progress Notes (Signed)
   Subjective:    Patient ID: RYLIN SEAVEY, male    DOB: Aug 29, 1990, 30 y.o.   MRN: [id number]   Chief Complaint: right knee pain  HPI Patient came in and saw Ijaola on 12/18/20. He was dx with gout and was given a steroid shot and indomethacin. Knee got better then he re injured it over the weekend. He was running around playing nerf and twisted his knee. Pain is mainly below his knee cap. Walking increases pain and some times knee feels like it is giving away. Full extension really hurts.    Review of Systems  Musculoskeletal:  Positive for joint swelling (right knee).      Objective:   Physical Exam Vitals and nursing note reviewed.  Constitutional:      Appearance: Normal appearance.  Cardiovascular:     Rate and Rhythm: Normal rate and regular rhythm.     Heart sounds: Normal heart sounds.  Pulmonary:     Breath sounds: Normal breath sounds.  Musculoskeletal:     Comments: Right knee effusion with pain on full flexion and extension All ligaments intact  Neurological:     General: No focal deficit present.     Mental Status: He is alert and oriented to person, place, and time.    BP 138/90   Pulse 78   Temp 97.9 F (36.6 C) (Temporal)   Resp 20   Ht 5\' 8"  (1.727 m)   Wt 184 lb (83.5 kg)   SpO2 98%   BMI 27.98 kg/m        Assessment & Plan:  JAMEZ AMBROCIO in today with chief complaint of Knee Pain   1. Injury of right knee, initial encounter Ice BID Wrap when up walking Rest  elevate - Ambulatory referral to Orthopedic Surgery - predniSONE (STERAPRED UNI-PAK 21 TAB) 10 MG (21) TBPK tablet; As directed x 6 days  Dispense: 21 tablet; Refill: 0    The above assessment and management plan was discussed with the patient. The patient verbalized understanding of and has agreed to the management plan. Patient is aware to call the clinic if symptoms persist or worsen. Patient is aware when to return to the clinic for a follow-up visit. Patient educated on when  it is appropriate to go to the emergency department.   Mary-Margaret Hassell Done, FNP

## 2021-01-05 NOTE — Patient Instructions (Signed)
RICE Therapy for Routine Care of Injuries Many injuries can be cared for with rest, ice, compression, and elevation (RICE therapy). This includes: Resting the injured body part. Putting ice on the injury. Putting pressure (compression) on the injury. Raising the injured part (elevation). Using RICE therapy can help to lessen pain and swelling. Supplies needed: Ice. Plastic bag. Towel. Elastic bandage. Pillow or pillows to raise your injured body part. How to care for your injury with RICE therapy Rest Try to rest the injured part of your body. You can go back to your normal activities when your doctor says it is okay to do them and when you can do themwithout pain. If you rest the injury too much, it may not heal as well. Some injuries heal better with early movement instead of resting for too long. Ask your doctor ifyou should do exercises to help your injury get better. Ice  If told, put ice on the injured area. To do this: Put ice in a plastic bag. Place a towel between your skin and the bag. Leave the ice on for 20 minutes, 2-3 times a day. Take off the ice if your skin turns bright red. This is very important. If you cannot feel pain, heat, or cold, you have a greater risk of damage to the area. Do not put ice on your bare skin. Use ice for as many days as your doctor tells you to use it.  Compression Put pressure on the injured area. This can be done with an elastic bandage. If this type of bandage has been put on your injury: Follow instructions on the package the bandage came in about how to use it. Do not wrap the bandage too tightly. Wrap the bandage more loosely if part of your body beyond the bandage is blue, swollen, cold, painful, or loses feeling. Take off the bandage and put it on again every 3-4 hours or as told by your doctor. See your doctor if the bandage seems to make your problems worse.  Elevation Raise the injured area above the level of your heart while you  are sitting orlying down. Follow these instructions at home: If your symptoms get worse or last a long time, make a follow-up appointment with your doctor. You may need to have imaging tests, such as X-rays or an MRI. If you have imaging tests, ask how to get your results when they are ready. Return to your normal activities when your doctor says that it is safe. Keep all follow-up visits. Contact a doctor if: You keep having pain and swelling. Your symptoms get worse. Get help right away if: You have sudden, very bad pain at your injury or lower than your injury. You have redness or more swelling around your injury. You have tingling or numbness at your injury or lower than your injury, and it does not go away when you take off the bandage. Summary Many injuries can be cared for using rest, ice, compression, and elevation (RICE therapy). You can go back to your normal activities when your doctor says it is okay and when you can do them without pain. Put ice on the injured area as told by your doctor. Get help if your symptoms get worse or if you keep having pain and swelling. This information is not intended to replace advice given to you by your health care provider. Make sure you discuss any questions you have with your healthcare provider. Document Revised: 12/16/2019 Document Reviewed: 12/16/2019 Elsevier Patient Education    2022 [employer]  

## 2021-02-27 ENCOUNTER — Other Ambulatory Visit: Payer: Self-pay

## 2021-02-27 DIAGNOSIS — M109 Gout, unspecified: Secondary | ICD-10-CM

## 2021-04-04 ENCOUNTER — Encounter: Payer: Self-pay | Admitting: Nurse Practitioner

## 2021-04-04 ENCOUNTER — Ambulatory Visit: Payer: 59 | Admitting: Nurse Practitioner

## 2021-04-04 VITALS — BP 129/83 | HR 92 | Temp 98.3°F | Resp 20 | Ht 68.0 in | Wt 179.0 lb

## 2021-04-04 DIAGNOSIS — E291 Testicular hypofunction: Secondary | ICD-10-CM

## 2021-04-04 DIAGNOSIS — F902 Attention-deficit hyperactivity disorder, combined type: Secondary | ICD-10-CM | POA: Diagnosis not present

## 2021-04-04 DIAGNOSIS — F3341 Major depressive disorder, recurrent, in partial remission: Secondary | ICD-10-CM

## 2021-04-04 MED ORDER — AMPHETAMINE-DEXTROAMPHET ER 30 MG PO CP24: 30.0000 mg | ORAL_CAPSULE | ORAL | 0 refills | Status: DC

## 2021-04-04 NOTE — Patient Instructions (Signed)

## 2021-04-04 NOTE — Progress Notes (Signed)
Subjective:    Patient ID: Charles [name], male    DOB: Jul 21, 1990, 31 y.o.   MRN: [id number]   Chief Complaint: ADHD    HPI:  Charles [name] is a 31 y.o. who identifies as a male who was assigned male at birth.   Social history: Lives with: girlfriend Work history: [employer]   Comes in today for follow up of the following chronic medical issues:  1. Attention deficit hyperactivity disorder (ADHD), combined type Patient has been on ADHD meds since high school. He is currently on adderall 30mg  XR. Is doing well. Is not able to concentrate at work without meds. No medication side effects.  2. Recurrent major depressive disorder, in partial remission ([place]) Is on celexa and abilify is doing well. He does not always remember to take his meds every day Depression screen [facility] 2/9 04/04/2021 12/18/2020 11/03/2020  Decreased Interest 0 0 1  Down, Depressed, Hopeless 1 0 0  PHQ - 2 Score 1 0 1  Altered sleeping 1 - 1  Tired, decreased energy 1 - 1  Change in appetite 0 - 0  Feeling bad or failure about yourself  1 - 0  Trouble concentrating 0 - 0  Moving slowly or fidgety/restless 0 - 0  Suicidal thoughts 0 - 0  PHQ-9 Score 4 - 3  Difficult doing work/chores Somewhat difficult - Somewhat difficult  Some recent data might be hidden     3. Hypogonadism in male Is on androgel when he can remember  to take    New complaints: None today  Allergies  Allergen Reactions   Lamictal [Lamotrigine] Other (See Comments)    "Muscle Spasms in back"   Outpatient Encounter Medications as of 04/04/2021  Medication Sig   ARIPiprazole (ABILIFY) 10 MG tablet Take 1 tablet (10 mg total) by mouth daily.   citalopram (CELEXA) 40 MG tablet Take 1 tablet (40 mg total) by mouth daily.   diphenhydrAMINE (BENADRYL) 25 MG tablet Take 1 tablet (25 mg total) by mouth every 6 (six) hours as needed for itching.   EPINEPHrine 0.3 mg/0.3 mL IJ SOAJ injection Inject 0.3 mLs (0.3 mg total) into the muscle once  as needed for up to 1 dose (Tongue or throat swelling).   testosterone (ANDROGEL) 50 MG/5GM (1%) GEL PLACE 5 GRAMS ONTO THE SKIN DAILY   valACYclovir (VALTREX) 1000 MG tablet Take 1 tablet (1,000 mg total) by mouth 2 (two) times daily as needed. Prn as needed for fever blisters   [DISCONTINUED] indomethacin (INDOCIN) 50 MG capsule Take 1 capsule (50 mg total) by mouth 3 (three) times daily with meals.   [DISCONTINUED] predniSONE (STERAPRED UNI-PAK 21 TAB) 10 MG (21) TBPK tablet As directed x 6 days   amphetamine-dextroamphetamine (ADDERALL XR) 30 MG 24 hr capsule Take 1 capsule (30 mg total) by mouth every morning.   amphetamine-dextroamphetamine (ADDERALL XR) 30 MG 24 hr capsule Take 1 capsule (30 mg total) by mouth every morning.   amphetamine-dextroamphetamine (ADDERALL XR) 30 MG 24 hr capsule Take 1 capsule (30 mg total) by mouth every morning.   No facility-administered encounter medications on file as of 04/04/2021.    Past Surgical History:  Procedure Laterality Date   KNEE ARTHROSCOPY W/ MENISCECTOMY  11/23/05   left knee - extensive bucket handle tear of medial meniscus   SHOULDER ARTHROSCOPY W/ BANKART PROCEDURE  08/10/06   TONSILLECTOMY  09/16/95    Family History  Problem Relation Age of Onset   Heart attack Maternal Grandfather  Diabetes Paternal Grandmother    Diabetes Paternal Grandfather    Hyperlipidemia Paternal Grandfather    Hypertension Paternal Grandfather    Heart disease Paternal Grandfather    Cancer Maternal Aunt        breast      Controlled substance contract: 11/07/20     Review of Systems  Constitutional:  Negative for diaphoresis.  Eyes:  Negative for pain.  Respiratory:  Negative for shortness of breath.   Cardiovascular:  Negative for chest pain, palpitations and leg swelling.  Gastrointestinal:  Negative for abdominal pain.  Endocrine: Negative for polydipsia.  Skin:  Negative for rash.  Neurological:  Negative for dizziness, weakness and  headaches.  Hematological:  Does not bruise/bleed easily.  All other systems reviewed and are negative.     Objective:   Physical Exam Vitals and nursing note reviewed.  Constitutional:      Appearance: Normal appearance. He is well-developed.  HENT:     Head: Normocephalic.     Nose: Nose normal.     Mouth/Throat:     Mouth: Mucous membranes are moist.     Pharynx: Oropharynx is clear.  Eyes:     Pupils: Pupils are equal, round, and reactive to light.  Neck:     Thyroid: No thyroid mass or thyromegaly.     Vascular: No carotid bruit or JVD.     Trachea: Phonation normal.  Cardiovascular:     Rate and Rhythm: Normal rate and regular rhythm.  Pulmonary:     Effort: Pulmonary effort is normal. No respiratory distress.     Breath sounds: Normal breath sounds.  Abdominal:     General: Bowel sounds are normal.     Palpations: Abdomen is soft.     Tenderness: There is no abdominal tenderness.  Musculoskeletal:        General: Normal range of motion.     Cervical back: Normal range of motion and neck supple.  Lymphadenopathy:     Cervical: No cervical adenopathy.  Skin:    General: Skin is warm and dry.  Neurological:     Mental Status: He is alert and oriented to person, place, and time.  Psychiatric:        Behavior: Behavior normal.        Thought Content: Thought content normal.        Judgment: Judgment normal.    BP 129/83    Pulse 92    Temp 98.3 F (36.8 C) (Temporal)    Resp 20    Ht 5\' 8"  (1.727 m)    Wt 179 lb (81.2 kg)    SpO2 99%    BMI 27.22 kg/m        Assessment & Plan:  [name] in today with chief complaint of ADHD   1. Attention deficit hyperactivity disorder (ADHD), combined type Stress management - amphetamine-dextroamphetamine (ADDERALL XR) 30 MG 24 hr capsule; Take 1 capsule (30 mg total) by mouth every morning.  Dispense: 30 capsule; Refill: 0 - amphetamine-dextroamphetamine (ADDERALL XR) 30 MG 24 hr capsule; Take 1 capsule (30 mg  total) by mouth every morning.  Dispense: 30 capsule; Refill: 0 - amphetamine-dextroamphetamine (ADDERALL XR) 30 MG 24 hr capsule; Take 1 capsule (30 mg total) by mouth every morning.  Dispense: 30 capsule; Refill: 0  2. Recurrent major depressive disorder, in partial remission ([place]) Need to take meds daily  3. Hypogonadism in male Back on testosterone    The above assessment and management plan was  discussed with the patient. The patient verbalized understanding of and has agreed to the management plan. Patient is aware to call the clinic if symptoms persist or worsen. Patient is aware when to return to the clinic for a follow-up visit. Patient educated on when it is appropriate to go to the emergency department.   Mary-Margaret Hassell Done, FNP

## 2021-04-27 NOTE — Progress Notes (Signed)
Office Visit Note  Patient: Charles [name]             Date of Birth: 05-01-1990           MRN: [id number]             PCP: Bennie Pierini, FNP Referring: Bennie Pierini, * Visit Date: 05/10/2021 Occupation: @GUAROCC @  Subjective:  Recurrent right ankle swelling  History of Present Illness: Charles [name] is a 31 y.o. male seen in consultation by request of his PCP for possible gout.  According the patient in January 2021 he woke up with right ankle joint pain and swelling to the point he was having difficulty walking.  He states he could not bear the touch of the blanket on his ankle.  He was placed on prednisone which she took for 2 days and then switch to Indocin.  He states the symptoms lasted for about 1 day.  He has had 6 episodes since then and the last episode was in January 2022.  He states in December he had an episode to the point he was having dizziness and confusion on Indocin.  He was switched to prednisone at the time.  All the episodes have been in his right ankle joint.  None of the other joints have been involved.  He states he eats shrimp and twisters.  He has seen some association with eating shrimp.  He used to eat red meat on a regular basis but he has been avoiding red meat now.  He drinks 2-3 beers per evening and rum on the weekends.  There is family history of gout in his father.  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  He had left knee joint arthroscopic surgery in 2007 due to meniscal tear injury at [facility].  He has some stiffness in his left knee joint but not chronic pain.  He also had right rotator cuff tear repair in 2008 which was also an injury during wrestling.  He has had some discomfort in his right knee joint last year for which she was seen at [facility].  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  None of the other joints are painful now.  Blood pressure has been elevated recently.  He contributes it to  increased stress at work.  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  He also has noticed Raynaud's phenomenon during winter months.  Activities of Daily Living:  Patient reports morning stiffness for 15-20 minutes.   Patient Denies nocturnal pain.  Difficulty dressing/grooming: Denies Difficulty climbing stairs: Reports Difficulty getting out of chair: Denies Difficulty using hands for taps, buttons, cutlery, and/or writing: Denies  Review of Systems  Constitutional:  Negative for fatigue.  HENT:  Positive for nosebleeds and nose dryness. Negative for mouth sores and mouth dryness.   Eyes:  Negative for pain, itching and dryness.  Respiratory:  Positive for shortness of breath. Negative for difficulty breathing.   Cardiovascular:  Negative for chest pain and palpitations.  Gastrointestinal:  Negative for blood in stool, constipation and diarrhea.  Endocrine: Negative for increased urination.  Genitourinary:  Negative for difficulty urinating.  Musculoskeletal:  Positive for joint pain, joint pain, joint swelling and morning stiffness. Negative for myalgias, muscle tenderness and myalgias.  Skin:  Positive for color change. Negative for rash, redness and sensitivity to sunlight.  Allergic/Immunologic: Negative for susceptible to infections.  Neurological:  Positive for numbness. Negative for  dizziness, headaches and weakness.  Hematological:  Negative for bruising/bleeding tendency and swollen glands.  Psychiatric/Behavioral:  Positive for depressed mood and sleep disturbance. Negative for confusion. The patient is nervous/anxious.    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 04/17/2012    Past Medical History:  Diagnosis  Date   ADD (attention deficit disorder) f   Allergic rhinitis    Gynecomastia, male     Family History  Problem Relation Age of Onset   Depression Mother    Heart Problems Father    Hypertension Father    Healthy Sister    Cancer Maternal Aunt        breast   Heart attack Maternal Grandfather    Diabetes Paternal Grandmother    Diabetes Paternal Grandfather    Hyperlipidemia Paternal Grandfather    Hypertension Paternal Grandfather    Heart disease Paternal Grandfather    Past Surgical History:  Procedure Laterality Date   KNEE ARTHROSCOPY W/ MENISCECTOMY  11/23/2005   left knee - extensive bucket handle tear of medial meniscus   SHOULDER ARTHROSCOPY W/ BANKART PROCEDURE Right 08/10/2006   TONSILLECTOMY  09/16/1995   Social History   Social History Narrative   Not on file   Immunization History  Administered Date(s) Administered   DTaP 02/21/1989, 05/02/1989, 07/11/1989, 06/30/1990, 07/25/1993   Hepatitis A 06/17/2005, 12/27/2005   Hepatitis B 07/28/1992, 09/21/1992, 02/07/1993   HiB (PRP-OMP) 02/21/1989, 05/02/1989, 07/11/1989, 06/30/1990   IPV 02/21/1989, 05/02/1989, 06/30/1990, 07/25/1993   Influenza,Quad,Nasal, Live 12/31/2012, 02/07/2014, 01/22/2018   Influenza,inj,Quad PF,6+ Mos 12/23/2014, 01/28/2017, 01/25/2019   MMR 04/09/1990, 07/25/1993   Meningococcal Conjugate 12/27/2005   Tdap 12/22/2006     Objective: Vital Signs: BP (!) 155/106 (BP Location: Right Arm, Patient Position: Sitting, Cuff Size: Normal)   Pulse 83   Ht 5\' 8"  (1.727 m)   Wt 182 lb 9.6 oz (82.8 kg)   BMI 27.76 kg/m    Physical Exam Vitals and nursing note reviewed.  Constitutional:      Appearance: He is well-developed.  HENT:     Head: Normocephalic and atraumatic.  Eyes:     Conjunctiva/sclera: Conjunctivae normal.     Pupils: Pupils are equal, round, and reactive to light.  Cardiovascular:     Rate and Rhythm: Normal rate and regular rhythm.     Heart sounds: Normal heart  sounds.  Pulmonary:     Effort: Pulmonary effort is normal.     Breath sounds: Normal breath sounds.  Abdominal:     General: Bowel sounds are normal.     Palpations: Abdomen is soft.  Musculoskeletal:     Cervical back: Normal range of motion and neck supple.  Skin:    General: Skin is warm and dry.     Capillary Refill: Capillary refill takes less than 2 seconds.  Neurological:     Mental Status: He is alert and oriented to person, place, and time.  Psychiatric:        Behavior: Behavior normal.     Musculoskeletal Exam: C-spine thoracic and lumbar spine were in good range of motion.  He was able to reach the floor with the palm of his hand.  Shoulder joints, elbow joints, wrist joints, MCPs PIPs and DIPs with good range of motion with no synovitis.  Hip joints, knee joints, ankles, MTPs and PIPs with good range of motion with no synovitis.  He had hypermobility in all of his joints.  CDAI Exam: CDAI Score: -- Patient Global: --; Provider Global: -- Swollen: --; Tender: -- Joint Exam 05/10/2021   No joint exam has been documented for this visit   There is currently no information documented on the homunculus. Go to the Rheumatology activity and complete the homunculus joint exam.  Investigation: No additional findings.  Imaging: No results found.  Recent Labs: Lab Results  Component Value Date   WBC 8.4 06/15/2020   HGB 17.2 06/15/2020   PLT 305 06/15/2020   NA 142 04/20/2012   K 4.0 04/20/2012   CL 105 04/20/2012   CO2 27 04/20/2012   GLUCOSE 89 04/20/2012   BUN 9 04/20/2012   CREATININE 0.95 04/20/2012   CALCIUM 9.9 04/20/2012    Speciality Comments: No specialty comments available.  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  The  last episode was in January 2022.  His uric acid was mildly elevated.  He was treated with prednisone for each episode and was also given a prescription for Indocin.  He states he had some side effects from Indocin.  He consumes shrimp and oysters.  He states he used to eat a lot of red meat but he has been avoiding red meat.  He drinks 2 to 3 glasses of beer every night and rum  over the weekends.  His father was diagnosed with gout recently.  He had no joint swelling or synovitis on examination.  No tophi were present.  I advised him to contact me in case he develops any swelling.  I also discussed possible use of colchicine in the future.  06/15/20: Uric acid 7.1, RF 10.4, ESR 3  Pain in right ankle and joints of right foot -has been experiencing recurrent swelling of the right ankle joint over the last year.  The symptoms last over 2 to 3 days.  He states the symptoms subside with and without medication.  Diet and alcohol use may be contributing to the symptoms .  I will obtain additional labs today.  Plan: Sedimentation rate, HLA-B27 antigen, Angiotensin converting enzyme, Uric acid  Raynaud's disease without gangrene -he gives history of Raynaud's phenomenon for many years.  No nailbed capillary changes or sclerodactyly were noted.  I will obtain additional labs today.  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  He had hypermobility in all of his joints.  Other fatigue -he gives history of chronic fatigue.  He states he works long hours and has been under a lot of stress.  Plan: CBC with Differential/Platelet, COMPLETE METABOLIC PANEL WITH GFR  Elevated blood pressure reading-his blood pressure was elevated today.  He states his blood pressure has been recently elevated.   I advised him to contact his PCP to start on a antihypertensive medication.  Complications from untreated chronic hypertension were discussed.  He believes that the stress and Adderall may be contributing to his hypertension.  He also chews tobacco which contain salt.  Low-salt diet was advised.  History of bulimia-he gives history of bulimia.  He states the symptoms are less frequent now.  Alcohol use-he drinks 2-3 beers per day and rum over the weekend.  He had detailed discussion regarding decreasing alcohol use and switching from beer to wine.  Chews tobacco-he chews tobacco with salt.  We discussed that tobacco and salt may be contributing to his hypertension.  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  Recurrent major depressive disorder, in partial remission (HCC)-treated with Celexa and Abilify.  He has been under a lot of stress.  Orders: Orders Placed This Encounter  Procedures   CBC with Differential/Platelet   COMPLETE METABOLIC PANEL WITH GFR   Sedimentation rate   HLA-B27 antigen   Angiotensin converting enzyme   ANA   Uric acid   Beta-2 glycoprotein antibodies   Cardiolipin antibodies, IgG, IgM, IgA   Lupus Anticoagulant Eval w/Reflex   Anti-DNA antibody, double-stranded   Anti-Smith antibody   RNP Antibody   Cryoglobulin   No orders of the defined types were placed in this encounter.    Follow-Up Instructions: Return for Right ankle swelling, Raynauds.   Pollyann Savoy, MD  Note - This record has been created using Animal nutritionist.  Chart creation errors have been sought, but may not always  have been located. Such creation errors do not reflect on  the standard of medical care.

## 2021-05-10 ENCOUNTER — Encounter: Payer: Self-pay | Admitting: Rheumatology

## 2021-05-10 ENCOUNTER — Ambulatory Visit: Payer: 59 | Admitting: Rheumatology

## 2021-05-10 ENCOUNTER — Other Ambulatory Visit: Payer: Self-pay

## 2021-05-10 VITALS — BP 155/106 | HR 83 | Ht 68.0 in | Wt 182.6 lb

## 2021-05-10 DIAGNOSIS — N62 Hypertrophy of breast: Secondary | ICD-10-CM

## 2021-05-10 DIAGNOSIS — M249 Joint derangement, unspecified: Secondary | ICD-10-CM | POA: Diagnosis not present

## 2021-05-10 DIAGNOSIS — E291 Testicular hypofunction: Secondary | ICD-10-CM

## 2021-05-10 DIAGNOSIS — Z720 Tobacco use: Secondary | ICD-10-CM

## 2021-05-10 DIAGNOSIS — M1A0790 Idiopathic chronic gout, unspecified ankle and foot, without tophus (tophi): Secondary | ICD-10-CM

## 2021-05-10 DIAGNOSIS — M25571 Pain in right ankle and joints of right foot: Secondary | ICD-10-CM

## 2021-05-10 DIAGNOSIS — F902 Attention-deficit hyperactivity disorder, combined type: Secondary | ICD-10-CM

## 2021-05-10 DIAGNOSIS — I7300 Raynaud's syndrome without gangrene: Secondary | ICD-10-CM | POA: Diagnosis not present

## 2021-05-10 DIAGNOSIS — Z8659 Personal history of other mental and behavioral disorders: Secondary | ICD-10-CM

## 2021-05-10 DIAGNOSIS — F3341 Major depressive disorder, recurrent, in partial remission: Secondary | ICD-10-CM

## 2021-05-10 DIAGNOSIS — Z789 Other specified health status: Secondary | ICD-10-CM

## 2021-05-10 DIAGNOSIS — R5383 Other fatigue: Secondary | ICD-10-CM

## 2021-05-10 DIAGNOSIS — R030 Elevated blood-pressure reading, without diagnosis of hypertension: Secondary | ICD-10-CM

## 2021-05-10 NOTE — Patient Instructions (Signed)
Gout ?Gout is a condition that causes painful swelling of the joints. Gout is a type of inflammation of the joints (arthritis). This condition is caused by having too much uric acid in the body. Uric acid is a chemical that forms when the body breaks down substances called purines. Purines are important for building body proteins. ?When the body has too much uric acid, sharp crystals can form and build up inside the joints. This causes pain and swelling. Gout attacks can happen quickly and may be very painful (acute gout). Over time, the attacks can affect more joints and become more frequent (chronic gout). Gout can also cause uric acid to build up under the skin and inside the kidneys. ?What are the causes? ?This condition is caused by too much uric acid in your blood. This can happen because: ?Your kidneys do not remove enough uric acid from your blood. This is the most common cause. ?Your body makes too much uric acid. This can happen with some cancers and cancer treatments. It can also occur if your body is breaking down too many red blood cells (hemolytic anemia). ?You eat too many foods that are high in purines. These foods include organ meats and some seafood. Alcohol, especially beer, is also high in purines. ?A gout attack may be triggered by trauma or stress. ?What increases the risk? ?You are more likely to develop this condition if you: ?Have a family history of gout. ?Are male and middle-aged. ?Are male and have gone through menopause. ?Are obese. ?Frequently drink alcohol, especially beer. ?Are dehydrated. ?Lose weight too quickly. ?Have an organ transplant. ?Have lead poisoning. ?Take certain medicines, including aspirin, cyclosporine, diuretics, levodopa, and niacin. ?Have kidney disease. ?Have a skin condition called psoriasis. ?What are the signs or symptoms? ?An attack of acute gout happens quickly. It usually occurs in just one joint. The most common place is the big toe. Attacks often start  at night. Other joints that may be affected include joints of the feet, ankle, knee, fingers, wrist, or elbow. Symptoms of this condition may include: ?Severe pain. ?Warmth. ?Swelling. ?Stiffness. ?Tenderness. The affected joint may be very painful to touch. ?Shiny, red, or purple skin. ?Chills and fever. ?Chronic gout may cause symptoms more frequently. More joints may be involved. You may also have white or yellow lumps (tophi) on your hands or feet or in other areas near your joints. ?How is this diagnosed? ?This condition is diagnosed based on your symptoms, medical history, and physical exam. You may have tests, such as: ?Blood tests to measure uric acid levels. ?Removal of joint fluid with a thin needle (aspiration) to look for uric acid crystals. ?X-rays to look for joint damage. ?How is this treated? ?Treatment for this condition has two phases: treating an acute attack and preventing future attacks. Acute gout treatment may include medicines to reduce pain and swelling, including: ?NSAIDs. ?Steroids. These are strong anti-inflammatory medicines that can be taken by mouth (orally) or injected into a joint. ?Colchicine. This medicine relieves pain and swelling when it is taken soon after an attack. It can be given by mouth or through an IV. ?Preventive treatment may include: ?Daily use of smaller doses of NSAIDs or colchicine. ?Use of a medicine that reduces uric acid levels in your blood. ?Changes to your diet. You may need to see a dietitian about what to eat and drink to prevent gout. ?Follow these instructions at home: ?During a gout attack ? ?If directed, put ice on the affected area: ?  Put ice in a plastic bag. ?Place a towel between your skin and the bag. ?Leave the ice on for 20 minutes, 2-3 times a day. ?Raise (elevate) the affected joint above the level of your heart as often as possible. ?Rest the joint as much as possible. If the affected joint is in your leg, you may be given crutches to  use. ?Follow instructions from your health care provider about eating or drinking restrictions. ?Avoiding future gout attacks ?Follow a low-purine diet as told by your dietitian or health care provider. Avoid foods and drinks that are high in purines, including liver, kidney, anchovies, asparagus, herring, mushrooms, mussels, and beer. ?Maintain a healthy weight or lose weight if you are overweight. If you want to lose weight, talk with your health care provider. It is important that you do not lose weight too quickly. ?Start or maintain an exercise program as told by your health care provider. ?Eating and drinking ?Drink enough fluids to keep your urine pale yellow. ?If you drink alcohol: ?Limit how much you use to: ?0-1 drink a day for women. ?0-2 drinks a day for men. ?Be aware of how much alcohol is in your drink. In the [place] one drink equals one 12 oz bottle of beer (355 mL) one 5 oz glass of wine (148 mL), or one 1? oz glass of hard liquor (44 mL). ?General instructions ?Take over-the-counter and prescription medicines only as told by your health care provider. ?Do not drive or use heavy machinery while taking prescription pain medicine. ?Return to your normal activities as told by your health care provider. Ask your health care provider what activities are safe for you. ?Keep all follow-up visits as told by your health care provider. This is important. ?Contact a health care provider if you have: ?Another gout attack. ?Continuing symptoms of a gout attack after 10 days of treatment. ?Side effects from your medicines. ?Chills or a fever. ?Burning pain when you urinate. ?Pain in your lower back or belly. ?Get help right away if you: ?Have severe or uncontrolled pain. ?Cannot urinate. ?Summary ?Gout is painful swelling of the joints caused by inflammation. ?The most common site of pain is the big toe, but it can affect other joints in the body. ?Medicines and dietary changes can help to prevent and treat gout  attacks. ?This information is not intended to replace advice given to you by your health care provider. Make sure you discuss any questions you have with your health care provider. ?Document Revised: 09/05/2017 Document Reviewed: 09/17/2017 ?Elsevier Patient Education ? 2022 [employer] ? ?

## 2021-05-10 NOTE — Telephone Encounter (Signed)
Please refer patient to Dr. Tamala Julian for the evaluation of hypermobility

## 2021-05-11 NOTE — Progress Notes (Signed)
Office Visit Note  Patient: Charles [name]             Date of Birth: 1990-09-24           MRN: [id number]             PCP: Chevis Pretty, FNP Referring: Chevis Pretty, * Visit Date: 05/15/2021 Occupation: @GUAROCC @  Subjective:  Discuss lab work and treatment options  History of Present Illness: Charles [name] is a 31 y.o. male history of recurrent ankle joint pain.  He states he has been having pain and discomfort in his bilateral ankles.  Left ankle joint has been more painful.  None of the other joints are painful.  He denies any history of tophi.  He denies any recent symptoms of Raynauds.  Activities of Daily Living:  Patient reports morning stiffness for 15-20 minutes.   Patient Denies nocturnal pain.  Difficulty dressing/grooming: Denies Difficulty climbing stairs: Reports Difficulty getting out of chair: Denies Difficulty using hands for taps, buttons, cutlery, and/or writing: Denies  Review of Systems  Constitutional:  Positive for fatigue.  HENT:  Positive for mouth dryness. Negative for mouth sores and nose dryness.   Eyes:  Negative for pain, itching and dryness.  Respiratory:  Negative for shortness of breath and difficulty breathing.   Cardiovascular:  Negative for chest pain and palpitations.  Gastrointestinal:  Negative for blood in stool, constipation and diarrhea.  Endocrine: Negative for increased urination.  Genitourinary:  Negative for difficulty urinating.  Musculoskeletal:  Positive for morning stiffness. Negative for joint pain, joint pain, joint swelling, myalgias, muscle tenderness and myalgias.  Skin:  Negative for color change, rash and redness.  Allergic/Immunologic: Negative for susceptible to infections.  Neurological:  Negative for dizziness, numbness, headaches, memory loss and weakness.  Hematological:  Negative for bruising/bleeding tendency.  Psychiatric/Behavioral:  Negative for confusion.    PMFS History:  Patient Active  Problem List   Diagnosis Date Noted   Recurrent major depressive disorder, in partial remission ([place]) 01/28/2017   Hypogonadism in male 01/28/2017   Attention deficit hyperactivity disorder (ADHD), combined type 08/03/2014   Gynecomastia, male - bilateral 04/17/2012    Past Medical History:  Diagnosis Date   ADD (attention deficit disorder) f   Allergic rhinitis    Gynecomastia, male     Family History  Problem Relation Age of Onset   Depression Mother    Heart Problems Father    Hypertension Father    Healthy Sister    Cancer Maternal Aunt        breast   Heart attack Maternal Grandfather    Diabetes Paternal Grandmother    Diabetes Paternal Grandfather    Hyperlipidemia Paternal Grandfather    Hypertension Paternal Grandfather    Heart disease Paternal Grandfather    Past Surgical History:  Procedure Laterality Date   KNEE ARTHROSCOPY W/ MENISCECTOMY  11/23/2005   left knee - extensive bucket handle tear of medial meniscus   SHOULDER ARTHROSCOPY W/ BANKART PROCEDURE Right 08/10/2006   TONSILLECTOMY  09/16/1995   Social History   Social History Narrative   Not on file   Immunization History  Administered Date(s) Administered   DTaP 02/21/1989, 05/02/1989, 07/11/1989, 06/30/1990, 07/25/1993   Hepatitis A 06/17/2005, 12/27/2005   Hepatitis B 07/28/1992, 09/21/1992, 02/07/1993   HiB (PRP-OMP) 02/21/1989, 05/02/1989, 07/11/1989, 06/30/1990   IPV 02/21/1989, 05/02/1989, 06/30/1990, 07/25/1993   Influenza,Quad,Nasal, Live 12/31/2012, 02/07/2014, 01/22/2018   Influenza,inj,Quad PF,6+ Mos 12/23/2014, 01/28/2017, 01/25/2019   MMR 04/09/1990, 07/25/1993  Meningococcal Conjugate 12/27/2005   Tdap 12/22/2006     Objective: Vital Signs: BP (!) 151/96 (BP Location: Left Arm, Patient Position: Sitting, Cuff Size: Normal)    Pulse 78    Ht 5' 8"  (1.727 m)    Wt 183 lb (83 kg)    BMI 27.83 kg/m    Physical Exam Vitals and nursing note reviewed.  Constitutional:       Appearance: He is well-developed.  HENT:     Head: Normocephalic and atraumatic.  Eyes:     Conjunctiva/sclera: Conjunctivae normal.     Pupils: Pupils are equal, round, and reactive to light.  Cardiovascular:     Rate and Rhythm: Normal rate and regular rhythm.     Heart sounds: Normal heart sounds.  Pulmonary:     Effort: Pulmonary effort is normal.     Breath sounds: Normal breath sounds.  Abdominal:     General: Bowel sounds are normal.     Palpations: Abdomen is soft.  Musculoskeletal:     Cervical back: Normal range of motion and neck supple.  Skin:    General: Skin is warm and dry.     Capillary Refill: Capillary refill takes less than 2 seconds.  Neurological:     Mental Status: He is alert and oriented to person, place, and time.  Psychiatric:        Behavior: Behavior normal.     Musculoskeletal Exam: C-spine thoracic and lumbar spine were in good range of motion.  Shoulder joints, elbow joints, wrist joints, MCPs PIPs and DIPs with good range of motion with no synovitis.  Hip joints, knee joints, ankles, MTPs and PIPs with good range of motion with no synovitis.  He had tenderness on palpation of bilateral ankles.  CDAI Exam: CDAI Score: -- Patient Global: --; Provider Global: -- Swollen: --; Tender: -- Joint Exam 05/15/2021   No joint exam has been documented for this visit   There is currently no information documented on the homunculus. Go to the Rheumatology activity and complete the homunculus joint exam.  Investigation: No additional findings.  Imaging: No results found.  Recent Labs: Lab Results  Component Value Date   WBC 7.1 05/10/2021   HGB 17.8 (H) 05/10/2021   PLT 333 05/10/2021   NA 142 05/10/2021   K 3.9 05/10/2021   CL 105 05/10/2021   CO2 27 05/10/2021   GLUCOSE 96 05/10/2021   BUN 8 05/10/2021   CREATININE 1.05 05/10/2021   BILITOT 0.6 05/10/2021   AST 24 05/10/2021   ALT 37 05/10/2021   PROT 6.7 05/10/2021   CALCIUM 9.9  05/10/2021   May 10, 2021 sed rate 2, HLA-B27 negative, ACE 37, uric acid 9.8 dsDNA negative, Smith negative, RNP negative, cryoglobulins pending, anticardiolipin negative, beta-2 GP 1 negative, lupus anticoagulant pending  Speciality Comments: No specialty comments available.  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  He had tenderness on palpation of bilateral ankles.  He has been having recurrent swelling requiring NSAIDs or steroids.  Detailed counsel regarding gouty arthropathy was provided.  Different doctor options and their side effects were discussed.  I will start him on colchicine 0.6 mg p.o. daily.  He was advised to increase colchicine to twice daily in case he has a flare.  He will also start allopurinol 100  mg p.o. daily and after 1 week will increase it to 200 mg p.o. daily and then 300 mg p.o. daily.  The goal is to keep him on allopurinol indefinitely.  Colchicine will be stopped once we reached the desirable range of allopurinol and he states symptom-free for at least 6 months.  Abstinence from alcohol was discussed.  Avoiding red meat and shellfish was also discussed.  Dietary modifications were discussed at length during the last visit as well.  Side effects of allopurinol and colchicine were discussed at length.  Handout was given.  He was advised to stop allopurinol in case he develops a rash and contact us.  Pain in right ankle and joints of right foot-he had discomfort in his bilateral ankles today.  Raynaud's disease without gangrene-Raynauds and intermittent per patient and is currently not active.  Most of his autoimmune work-up including double-stranded DNA, Smith, RNP, ACE level had been negative.  Beta-2 GP 1 and anticardiolipin antibodies were  negative.  Lupus anticoagulant and cryoglobulins are pending.  Elevated hemoglobin (HCC) -his hemoglobin is elevated.  Which could be due to alcohol use.  I will obtain additional iron studies today.  Plan: Iron, TIBC and Ferritin Panel  High risk medication use -G6PD deficiency can be seen in hyperuricemic gout.  I will obtain G6PD today.  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  He is concerned about possible EDS.  He was referred to Dr. Tamala Julian.  I also advised that he should see an ophthalmologist and a cardiologist.  He would like to discuss this further with Dr. Tamala Julian.  Isometric exercises were discussed.  Elevated blood pressure reading-his blood pressure is a still elevated.  Advised him to schedule an appointment with his PCP for treatment of hypertension.  Long-term complications of hypertension were also discussed.  History of bulimia-dietary modifications were discussed.  Alcohol use-abstinence from alcohol and decreasing alcohol intake was discussed.  Other medical problems are listed as follows:  Chews tobacco  Hypogonadism in male  Gynecomastia, male - bilateral  Attention deficit hyperactivity disorder (ADHD), combined type  Recurrent major depressive disorder, in partial remission ([place])  Orders: Orders Placed This Encounter  Procedures   Iron, TIBC and Ferritin Panel   Glucose 6 phosphate dehydrogenase   Meds ordered this encounter  Medications   colchicine 0.6 MG tablet    Sig: Take 0.43m po qd, increase to BID for flares.    Dispense:  60 tablet    Refill:  3   allopurinol (ZYLOPRIM) 100 MG tablet    Sig: Take 1060mpo qd for 1 week, then take 20050mo qd for 1 week then take 300m38m qd.    Dispense:  63 tablet    Refill:  0     Follow-Up Instructions: Return in about 6 weeks (around 06/26/2021) for Gout, Raynauds.   ShaiBo Merino  Note - This record has been created using [employer] producerhart creation errors have been sought, but may not always  have been located. Such creation errors do not reflect on  the standard of medical care.

## 2021-05-11 NOTE — Progress Notes (Signed)
Labs are consistent with gout.  Most of the labs are still pending.  Please see if patient wants to come next Tuesday and start treatment for gout

## 2021-05-15 ENCOUNTER — Ambulatory Visit (INDEPENDENT_AMBULATORY_CARE_PROVIDER_SITE_OTHER): Payer: 59 | Admitting: Rheumatology

## 2021-05-15 ENCOUNTER — Other Ambulatory Visit: Payer: Self-pay

## 2021-05-15 ENCOUNTER — Encounter: Payer: Self-pay | Admitting: Rheumatology

## 2021-05-15 VITALS — BP 151/96 | HR 78 | Ht 68.0 in | Wt 183.0 lb

## 2021-05-15 DIAGNOSIS — F3341 Major depressive disorder, recurrent, in partial remission: Secondary | ICD-10-CM

## 2021-05-15 DIAGNOSIS — R030 Elevated blood-pressure reading, without diagnosis of hypertension: Secondary | ICD-10-CM

## 2021-05-15 DIAGNOSIS — D582 Other hemoglobinopathies: Secondary | ICD-10-CM | POA: Diagnosis not present

## 2021-05-15 DIAGNOSIS — F902 Attention-deficit hyperactivity disorder, combined type: Secondary | ICD-10-CM

## 2021-05-15 DIAGNOSIS — E291 Testicular hypofunction: Secondary | ICD-10-CM

## 2021-05-15 DIAGNOSIS — M25571 Pain in right ankle and joints of right foot: Secondary | ICD-10-CM

## 2021-05-15 DIAGNOSIS — Z79899 Other long term (current) drug therapy: Secondary | ICD-10-CM

## 2021-05-15 DIAGNOSIS — Z8659 Personal history of other mental and behavioral disorders: Secondary | ICD-10-CM

## 2021-05-15 DIAGNOSIS — I7300 Raynaud's syndrome without gangrene: Secondary | ICD-10-CM | POA: Diagnosis not present

## 2021-05-15 DIAGNOSIS — M1A0790 Idiopathic chronic gout, unspecified ankle and foot, without tophus (tophi): Secondary | ICD-10-CM

## 2021-05-15 DIAGNOSIS — Z789 Other specified health status: Secondary | ICD-10-CM

## 2021-05-15 DIAGNOSIS — M249 Joint derangement, unspecified: Secondary | ICD-10-CM

## 2021-05-15 DIAGNOSIS — N62 Hypertrophy of breast: Secondary | ICD-10-CM

## 2021-05-15 DIAGNOSIS — Z720 Tobacco use: Secondary | ICD-10-CM

## 2021-05-15 DIAGNOSIS — F1090 Alcohol use, unspecified, uncomplicated: Secondary | ICD-10-CM

## 2021-05-15 MED ORDER — ALLOPURINOL 100 MG PO TABS: ORAL_TABLET | ORAL | 0 refills | Status: DC

## 2021-05-15 MED ORDER — COLCHICINE 0.6 MG PO TABS: ORAL_TABLET | ORAL | 3 refills | Status: DC

## 2021-05-15 NOTE — Patient Instructions (Addendum)
Standing Labs We placed an order today for your standing lab work.   Please have your standing labs drawn in 6 weeks   If possible, please have your labs drawn 2 weeks prior to your appointment so that the provider can discuss your results at your appointment.  Please note that you may see your imaging and lab results in [facility] before we have reviewed them. We may be awaiting multiple results to interpret others before contacting you. Please allow our office up to 72 hours to thoroughly review all of the results before contacting the office for clarification of your results.  We have open lab daily: Monday through Thursday from 1:30-4:30 PM and Friday from 1:30-4:00 PM at the office of Dr. Bo Merino, [facility].   Please be advised, all patients with office appointments requiring lab work will take precedent over walk-in lab work.  If possible, please come for your lab work on Monday and Friday afternoons, as you may experience shorter wait times. The office is located at [address], [place], [place], [place] [address] No appointment is necessary.   Labs are drawn by [facility]. Please bring your co-pay at the time of your lab draw.  You may receive a bill from [facility] for your lab work.  Please note if you are on Hydroxychloroquine and and an order has been placed for a Hydroxychloroquine level, you will need to have it drawn 4 hours or more after your last dose.  If you wish to have your labs drawn at another location, please call the office 24 hours in advance to send orders.  If you have any questions regarding directions or hours of operation,  please call [phone].   As a reminder, please drink plenty of water prior to coming for your lab work. Thanks!   Allopurinol Tablets What is this medication? ALLOPURINOL (al oh PURE i nole) treats gout or kidney stones. It may also be used to prevent high uric acid levels after chemotherapy. It works by decreasing  uric acid levels in your body. This medicine may be used for other purposes; ask your health care provider or pharmacist if you have questions. COMMON BRAND NAME(S): Zyloprim What should I tell my care team before I take this medication? They need to know if you have any of these conditions: Kidney disease Liver disease An unusual or allergic reaction to allopurinol, other medications, foods, dyes, or preservatives Pregnant or trying to get pregnant Breast-feeding How should I use this medication? Take this medication by mouth with a glass of water. Follow the directions on the prescription label. If this medication upsets your stomach, take it with food or milk. Take your doses at regular intervals. Do not take your medication more often than directed. Talk to your care team regarding the use of this medication in children. Special care may be needed. While this medication may be prescribed for children as young as 6 years for selected conditions, precautions do apply. Overdosage: If you think you have taken too much of this medicine contact a poison control center or emergency room at once. NOTE: This medicine is only for you. Do not share this medicine with others. What if I miss a dose? If you miss a dose, take it as soon as you can. If it is almost time for your next dose, take only that dose. Do not take double or extra doses. What may interact with this medication? Do not take this medication with the following: Didanosine, ddI  This medication may also interact with the following: Certain antibiotics like amoxicillin, ampicillin Certain medications for cancer Certain medications for immunosuppression like azathioprine, cyclosporine, mercaptopurine Chlorpropamide Probenecid Sulfinpyrazone Thiazide diuretics, like hydrochlorothiazide Warfarin This list may not describe all possible interactions. Give your health care provider a list of all the medicines, herbs, non-prescription  drugs, or dietary supplements you use. Also tell them if you smoke, drink alcohol, or use illegal drugs. Some items may interact with your medicine. What should I watch for while using this medication? Visit your care team for regular checks on your progress. If you are taking this medication to treat gout, you may not have less frequent attacks at first. Keep taking your medication regularly and the attacks should get better within 2 to 6 weeks. Drink plenty of water (10 to 12 full glasses a day) while you are taking this medication. This will help to reduce stomach upset and reduce the risk of getting gout or kidney stones. Call your care team at once if you get a skin rash together with chills, fever, sore throat, or nausea and vomiting, if you have blood in your urine, or difficulty passing urine. This medication may cause serious skin reactions. They can happen weeks to months after starting the medication. Contact your care team right away if you notice fevers or flu-like symptoms with a rash. The rash may be red or purple and then turn into blisters or peeling of the skin. Or, you might notice a red rash with swelling of the face, lips or lymph nodes in your neck or under your arms. Do not take vitamin C without asking your care team. Too much vitamin C can increase the chance of getting kidney stones. You may get drowsy or dizzy. Do not drive, use machinery, or do anything that needs mental alertness until you know how this medication affects you. Do not stand or sit up quickly, especially if you are an older patient. This reduces the risk of dizzy or fainting spells. Alcohol can make you more drowsy and dizzy. Alcohol can also increase the chance of stomach problems and increase the amount of uric acid in your blood. Avoid alcoholic drinks. What side effects may I notice from receiving this medication? Side effects that you should report to your care team as soon as possible: Allergic  reactions--skin rash, itching, hives, swelling of the face, lips, tongue, or throat Kidney injury--decrease in the amount of urine, swelling of the ankles, hands, or feet Liver injury--right upper belly pain, loss of appetite, nausea, light-colored stool, dark yellow or brown urine, yellowing skin or eyes, unusual weakness or fatigue Rash, fever, and swollen lymph nodes Redness, blistering, peeling, or loosening of the skin, including inside the mouth Side effects that usually do not require medical attention (report to your care team if they continue or are bothersome): Diarrhea Drowsiness Nausea Vomiting This list may not describe all possible side effects. Call your doctor for medical advice about side effects. You may report side effects to FDA at 1-800-FDA-1088. Where should I keep my medication? Keep out of the reach of children and pets. Store at room temperature between 15 and 25 degrees C (59 and 77 degrees F). Protect from light and moisture. Throw away any unused medication after the expiration date. NOTE: This sheet is a summary. It may not cover all possible information. If you have questions about this medicine, talk to your doctor, pharmacist, or health care provider.  Colchicine Tablets What is this medication? COLCHICINE (  KOL chi seen) prevents and treats gout attacks. It may also be used to treat familial Mediterranean fever (FMF). It works by decreasing inflammation and reducing the buildup of uric acid in your joints. This medicine may be used for other purposes; ask your health care provider or pharmacist if you have questions. COMMON BRAND NAME(S): Colcrys What should I tell my care team before I take this medication? They need to know if you have any of these conditions: Kidney disease Liver disease An unusual or allergic reaction to colchicine, other medications, foods, dyes, or preservatives Pregnant or trying to get pregnant Breast-feeding How should I use this  medication? Take this medication by mouth with a full glass of water. Follow the directions on the prescription label. You can take it with or without food. If it upsets your stomach, take it with food. Take your medication at regular intervals. Do not take your medication more often than directed. A special MedGuide will be given to you by the pharmacist with each prescription and refill. Be sure to read this information carefully each time. Talk to your care team regarding the use of this medication in children. While this medication may be prescribed for children as young as 18 years old for selected conditions, precautions do apply. Patients over 42 years old may have a stronger reaction and need a smaller dose. Overdosage: If you think you have taken too much of this medicine contact a poison control center or emergency room at once. NOTE: This medicine is only for you. Do not share this medicine with others. What if I miss a dose? If you miss a dose, take it as soon as you can. If it is almost time for your next dose, take only that dose. Do not take double or extra doses. What may interact with this medication? Do not take this medication with any of the following: Certain antivirals for HIV or hepatitis This medication may also interact with the following: Certain antibiotics like erythromycin or clarithromycin Certain medications for blood pressure, heart disease, irregular heart beat Certain medications for cholesterol like atorvastatin, lovastatin, and simvastatin Certain medications for fungal infections like ketoconazole, itraconazole, or posaconazole Cyclosporine Grapefruit or grapefruit juice This list may not describe all possible interactions. Give your health care provider a list of all the medicines, herbs, non-prescription drugs, or dietary supplements you use. Also tell them if you smoke, drink alcohol, or use illegal drugs. Some items may interact with your medicine. What  should I watch for while using this medication? Visit your care team for regular checks on your progress. Tell your care team if your symptoms do not start to get better or if they get worse. You should make sure you get enough vitamin B12 while you are taking this medication. Discuss the foods you eat and the vitamins you take with your care team. This medication may increase your risk to bruise or bleed. Call you care team if you notice any unusual bleeding. What side effects may I notice from receiving this medication? Side effects that you should report to your care team as soon as possible: Allergic reactions--skin rash, itching, hives, swelling of the face, lips, tongue, or throat Infection--fever, chills, cough, sore throat, wounds that don't heal, pain or trouble when passing urine, general feeling of discomfort or being unwell Muscle injury--unusual weakness or fatigue, muscle pain, dark yellow or brown urine, decrease in the amount of urine Pain, tingling, or numbness in the hands or feet Unusual bleeding or  bruising Side effects that usually do not require medical attention (report these to your care team if they continue or are bothersome): Diarrhea Nausea Vomiting This list may not describe all possible side effects. Call your doctor for medical advice about side effects. You may report side effects to FDA at 1-800-FDA-1088. Where should I keep my medication? Keep out of the reach of children and pets. Store at room temperature between 15 and 30 degrees C (59 and 86 degrees F). Keep container tightly closed. Protect from light. Throw away any unused medication after the expiration date. NOTE: This sheet is a summary. It may not cover all possible information. If you have questions about this medicine, talk to your doctor, pharmacist, or health care provider.  2022 Elsevier/Gold Standard (2020-04-07 00:00:00)

## 2021-05-16 ENCOUNTER — Other Ambulatory Visit: Payer: Self-pay

## 2021-05-16 DIAGNOSIS — E291 Testicular hypofunction: Secondary | ICD-10-CM

## 2021-05-16 LAB — CBC WITH DIFFERENTIAL/PLATELET
Absolute Monocytes: 525 cells/uL (ref 200–950)
Basophils Absolute: 50 cells/uL (ref 0–200)
Basophils Relative: 0.7 %
Eosinophils Absolute: 71 cells/uL (ref 15–500)
Eosinophils Relative: 1 %
HCT: 52.1 % — ABNORMAL HIGH (ref 38.5–50.0)
Hemoglobin: 17.8 g/dL — ABNORMAL HIGH (ref 13.2–17.1)
Lymphs Abs: 1782 cells/uL (ref 850–3900)
MCH: 30.5 pg (ref 27.0–33.0)
MCHC: 34.2 g/dL (ref 32.0–36.0)
MCV: 89.2 fL (ref 80.0–100.0)
MPV: 10.2 fL (ref 7.5–12.5)
Monocytes Relative: 7.4 %
Neutro Abs: 4672 cells/uL (ref 1500–7800)
Neutrophils Relative %: 65.8 %
Platelets: 333 10*3/uL (ref 140–400)
RBC: 5.84 10*6/uL — ABNORMAL HIGH (ref 4.20–5.80)
RDW: 12.7 % (ref 11.0–15.0)
Total Lymphocyte: 25.1 %
WBC: 7.1 10*3/uL (ref 3.8–10.8)

## 2021-05-16 LAB — RNP ANTIBODY: Ribonucleic Protein(ENA) Antibody, IgG: <1 AI

## 2021-05-16 LAB — COMPLETE METABOLIC PANEL WITH GFR
AG Ratio: 2.2 (calc) (ref 1.0–2.5)
ALT: 37 U/L (ref 9–46)
AST: 24 U/L (ref 10–40)
Albumin: 4.6 g/dL (ref 3.6–5.1)
Alkaline phosphatase (APISO): 67 U/L (ref 36–130)
BUN: 8 mg/dL (ref 7–25)
CO2: 27 mmol/L (ref 20–32)
Calcium: 9.9 mg/dL (ref 8.6–10.3)
Chloride: 105 mmol/L (ref 98–110)
Creat: 1.05 mg/dL (ref 0.60–1.26)
Globulin: 2.1 g/dL (calc) (ref 1.9–3.7)
Glucose, Bld: 96 mg/dL (ref 65–99)
Potassium: 3.9 mmol/L (ref 3.5–5.3)
Sodium: 142 mmol/L (ref 135–146)
Total Bilirubin: 0.6 mg/dL (ref 0.2–1.2)
Total Protein: 6.7 g/dL (ref 6.1–8.1)
eGFR: 97 mL/min/{1.73_m2} (ref >=60)

## 2021-05-16 LAB — ANGIOTENSIN CONVERTING ENZYME: Angiotensin-Converting Enzyme: 37 U/L (ref 9–67)

## 2021-05-16 LAB — BETA-2 GLYCOPROTEIN ANTIBODIES
Beta-2 Glyco 1 IgA: <2 U/mL (ref <20.0)
Beta-2 Glyco 1 IgM: <2 U/mL (ref <20.0)
Beta-2 Glyco I IgG: <2 U/mL (ref <20.0)

## 2021-05-16 LAB — CARDIOLIPIN ANTIBODIES, IGG, IGM, IGA
Anticardiolipin IgA: <2 APL-U/mL (ref <20.0)
Anticardiolipin IgG: <2 GPL-U/mL (ref <20.0)
Anticardiolipin IgM: <2 MPL-U/mL (ref <20.0)

## 2021-05-16 LAB — SEDIMENTATION RATE: Sed Rate: 2 mm/h (ref 0–15)

## 2021-05-16 LAB — ANA: Anti Nuclear Antibody (ANA): NEGATIVE

## 2021-05-16 LAB — URIC ACID: Uric Acid, Serum: 9.8 mg/dL — ABNORMAL HIGH (ref 4.0–8.0)

## 2021-05-16 LAB — CRYOGLOBULIN: Cryoglobulin, Qualitative Analysis: NOT DETECTED

## 2021-05-16 LAB — ANTI-DNA ANTIBODY, DOUBLE-STRANDED: ds DNA Ab: <1 IU/mL

## 2021-05-16 LAB — LUPUS ANTICOAGULANT EVAL W/ REFLEX
PTT-LA Screen: 37 s (ref <=40)
dRVVT: 44 s (ref <=45)

## 2021-05-16 LAB — ANTI-SMITH ANTIBODY: ENA SM Ab Ser-aCnc: <1 AI

## 2021-05-16 LAB — HLA-B27 ANTIGEN: HLA-B27 Antigen: NEGATIVE

## 2021-05-16 NOTE — Progress Notes (Signed)
Iron panel WNL

## 2021-05-17 ENCOUNTER — Other Ambulatory Visit: Payer: 59

## 2021-05-17 DIAGNOSIS — E291 Testicular hypofunction: Secondary | ICD-10-CM

## 2021-05-17 NOTE — Progress Notes (Signed)
Cryoglobulins are negative and lupus anticoagulant was negative.

## 2021-05-19 LAB — IRON,TIBC AND FERRITIN PANEL
%SAT: 31 % (calc) (ref 20–48)
Ferritin: 104 ng/mL (ref 38–380)
Iron: 103 ug/dL (ref 50–180)
TIBC: 333 mcg/dL (calc) (ref 250–425)

## 2021-05-19 LAB — GLUCOSE 6 PHOSPHATE DEHYDROGENASE: G-6PDH: 12.3 U/g Hgb (ref 7.0–20.5)

## 2021-05-21 NOTE — Progress Notes (Signed)
G6PD WNL

## 2021-05-22 ENCOUNTER — Other Ambulatory Visit: Payer: Self-pay

## 2021-05-22 LAB — TESTOSTERONE,FREE AND TOTAL
Testosterone, Free: 6.8 pg/mL — ABNORMAL LOW (ref 8.7–25.1)
Testosterone: 255 ng/dL — ABNORMAL LOW (ref 264–916)

## 2021-05-22 MED ORDER — TESTOSTERONE 4 MG/24HR TD PT24: 1.0000 | MEDICATED_PATCH | Freq: Every day | TRANSDERMAL | 2 refills | Status: DC

## 2021-05-22 NOTE — Progress Notes (Signed)
testosterone patch rx sent to pharmacy ? ?

## 2021-05-23 ENCOUNTER — Other Ambulatory Visit: Payer: Self-pay

## 2021-05-24 ENCOUNTER — Telehealth: Payer: Self-pay

## 2021-05-24 MED ORDER — AMPHETAMINE-DEXTROAMPHET ER 30 MG PO CP24: 30.0000 mg | ORAL_CAPSULE | ORAL | 0 refills | Status: DC

## 2021-05-24 NOTE — Telephone Encounter (Signed)
Adderall rx sent to a different pharmacy caus eppatient has moved ?Meds ordered this encounter  ?Medications  ? amphetamine-dextroamphetamine (ADDERALL XR) 30 MG 24 hr capsule  ?  Sig: Take 1 capsule (30 mg total) by mouth every morning.  ?  Dispense:  30 capsule  ?  Refill:  0  ?  Order Specific Question:   Supervising Provider  ?  Answer:   Caryl Pina A [[id number]]  ? amphetamine-dextroamphetamine (ADDERALL XR) 30 MG 24 hr capsule  ?  Sig: Take 1 capsule (30 mg total) by mouth every morning.  ?  Dispense:  30 capsule  ?  Refill:  0  ?  Order Specific Question:   Supervising Provider  ?  Answer:   Caryl Pina A [[id number]]  ? ?Charles Hassell Done, FNP ? ?

## 2021-05-24 NOTE — Telephone Encounter (Signed)
Key: [id number] - PA Case ID: [id number] - Rx #: [id number] ?Drug ?Androderm '4MG'$ /24HR 24 hr patches ? ?Send to  plan ?

## 2021-05-28 NOTE — Telephone Encounter (Signed)
Outcome ?Denied on March 17 ?Request Reference Number: [id number]. ANDRODERM DIS '4MG'$ /24HR is denied for not meeting the prior authorization requirement(s). Details of this decision are in the notice attached below or have been faxed to you. ?

## 2021-05-28 NOTE — Telephone Encounter (Signed)
He wants to change over the the injections ?

## 2021-05-28 NOTE — Telephone Encounter (Signed)
Please let patient know that testoaterone patches have been denied by  insurance. Has he tried androgel in the past? ?

## 2021-05-30 NOTE — Telephone Encounter (Signed)
Waiting to see if appeal is approved before we start doing testosterone patches ?

## 2021-05-31 ENCOUNTER — Ambulatory Visit: Payer: No Typology Code available for payment source | Admitting: Rheumatology

## 2021-06-04 ENCOUNTER — Other Ambulatory Visit: Payer: Self-pay

## 2021-06-04 DIAGNOSIS — E291 Testicular hypofunction: Secondary | ICD-10-CM

## 2021-06-05 ENCOUNTER — Other Ambulatory Visit: Payer: Self-pay

## 2021-06-05 ENCOUNTER — Other Ambulatory Visit: Payer: 59

## 2021-06-05 DIAGNOSIS — E291 Testicular hypofunction: Secondary | ICD-10-CM

## 2021-06-05 DIAGNOSIS — F3341 Major depressive disorder, recurrent, in partial remission: Secondary | ICD-10-CM

## 2021-06-05 MED ORDER — CITALOPRAM HYDROBROMIDE 40 MG PO TABS: 40.0000 mg | ORAL_TABLET | Freq: Every day | ORAL | 1 refills | Status: DC

## 2021-06-05 MED ORDER — ARIPIPRAZOLE 10 MG PO TABS: 10.0000 mg | ORAL_TABLET | Freq: Every day | ORAL | 1 refills | Status: DC

## 2021-06-05 NOTE — Progress Notes (Signed)
?Charlann Boxer D.O. ?[facility] ?[place] ?Phone: [phone] ?Subjective:   ?I, Jacqualin Combes, am serving as a scribe for Dr. Hulan Saas. ? ?This visit occurred during the SARS-CoV-2 public health emergency.  Safety protocols were in place, including screening questions prior to the visit, additional usage of staff PPE, and extensive cleaning of exam room while observing appropriate contact time as indicated for disinfecting solutions.  ? ? ?I'm seeing this patient by the request  of:  Dr. Estanislado Pandy MD ? ?CC: Hypermobility ? ?[id number]  ?Charles [name] is a 31 y.o. male coming in with complaint of hypermobility.  Patient has been seen by rheumatology with multiple different musculoskeletal complaints over the course of time.  Reviewing patient's last note when he was seen March 7 with secondary for bilateral ankle pain.  Reviewing patient's chart patient did have significantly elevated uric acid at 9.8 this month.  Patient was to start colchicine 1.6 mg daily as well as allopurinol 100 mg daily with increasing to 300 mg. ?Also most recent labs did show the patient did have a low testosterone including of free testosterone to 6.8 recently was given a prescription for testosterone patches. ? ?Patient would like to see if he has EDS. Patient states that he is very flexible and was able to put his legs behind his head and walk on his hands. Patient gets by fine day to day but wants to make sure he is going to be ok if he has any issues in the future. Worried about heart issues as he has a family history on paternal side as his dad had to have bypass. Hx of L meniscus surgery and R shoulder surgery but he could still dislocate shoulder after surgery and feels that he has symptoms consistent with EDS. Only area of pain is in R foot due to gout which precipitated evaluation by rheumatology.  ? ?  ? ?Past Medical History:  ?Diagnosis Date  ? ADD (attention deficit disorder) f   ? Allergic rhinitis   ? Gynecomastia, male   ? ?Past Surgical History:  ?Procedure Laterality Date  ? KNEE ARTHROSCOPY W/ MENISCECTOMY  11/23/2005  ? left knee - extensive bucket handle tear of medial meniscus  ? SHOULDER ARTHROSCOPY W/ BANKART PROCEDURE Right 08/10/2006  ? TONSILLECTOMY  09/16/1995  ? ?Social History  ? ?Socioeconomic History  ? Marital status: Single  ?  Spouse name: Not on file  ? Number of children: Not on file  ? Years of education: Not on file  ? Highest education level: Not on file  ?Occupational History  ? Not on file  ?Tobacco Use  ? Smoking status: Never  ?  Passive exposure: Never  ? Smokeless tobacco: Never  ?Vaping Use  ? Vaping Use: Never used  ?Substance and Sexual Activity  ? Alcohol use: Yes  ?  Alcohol/week: 6.0 standard drinks  ?  Types: 6 Cans of beer per week  ?  Comment: occasional  ? Drug use: No  ? Sexual activity: Not on file  ?Other Topics Concern  ? Not on file  ?Social History Narrative  ? Not on file  ? ?Social Determinants of Health  ? ?Financial Resource Strain: Not on file  ?Food Insecurity: Not on file  ?Transportation Needs: Not on file  ?Physical Activity: Not on file  ?Stress: Not on file  ?Social Connections: Not on file  ? ?Allergies  ?Allergen Reactions  ? Indomethacin   ?  Brain fog,  balance issues, cognitive dissonance.   ? Lamictal [Lamotrigine] Other (See Comments)  ?  "Muscle Spasms in back"  ? ?Family History  ?Problem Relation Age of Onset  ? Depression Mother   ? Heart Problems Father   ? Hypertension Father   ? Healthy Sister   ? Cancer Maternal Aunt   ?     breast  ? Heart attack Maternal Grandfather   ? Diabetes Paternal Grandmother   ? Diabetes Paternal Grandfather   ? Hyperlipidemia Paternal Grandfather   ? Hypertension Paternal Grandfather   ? Heart disease Paternal Grandfather   ? ? ?Current Outpatient Medications (Endocrine & Metabolic):  ?  testosterone (ANDRODERM) 4 MG/24HR PT24 patch, Place 1 patch onto the skin daily. ? ?Current Outpatient  Medications (Cardiovascular):  ?  EPINEPHrine 0.3 mg/0.3 mL IJ SOAJ injection, Inject 0.3 mLs (0.3 mg total) into the muscle once as needed for up to 1 dose (Tongue or throat swelling). ? ?Current Outpatient Medications (Respiratory):  ?  diphenhydrAMINE (BENADRYL) 25 MG tablet, Take 1 tablet (25 mg total) by mouth every 6 (six) hours as needed for itching. ? ?Current Outpatient Medications (Analgesics):  ?  allopurinol (ZYLOPRIM) 100 MG tablet, Take '100mg'$  po qd for 1 week, then take '200mg'$  po qd for 1 week then take '300mg'$  po qd. ?  colchicine 0.6 MG tablet, Take 0.'6mg'$  po qd, increase to BID for flares. ? ? ?Current Outpatient Medications (Other):  ?  [START ON 06/23/2021] amphetamine-dextroamphetamine (ADDERALL XR) 30 MG 24 hr capsule, Take 1 capsule (30 mg total) by mouth every morning. ?  amphetamine-dextroamphetamine (ADDERALL XR) 30 MG 24 hr capsule, Take 1 capsule (30 mg total) by mouth every morning. ?  ARIPiprazole (ABILIFY) 10 MG tablet, Take 1 tablet (10 mg total) by mouth daily. ?  citalopram (CELEXA) 40 MG tablet, Take 1 tablet (40 mg total) by mouth daily. ?  valACYclovir (VALTREX) 1000 MG tablet, Take 1 tablet (1,000 mg total) by mouth 2 (two) times daily as needed. Prn as needed for fever blisters ? ? ?Reviewed prior external information including notes and imaging from  ?primary care provider ?As well as notes that were available from care everywhere and other healthcare systems. ? ?Past medical history, social, surgical and family history all reviewed in electronic medical record.  No pertanent information unless stated regarding to the chief complaint.  ? ?Review of Systems: ? No headache, visual changes, nausea, vomiting, diarrhea, constipation, dizziness, abdominal pain, skin rash, fevers, chills, night sweats, weight loss, swollen lymph nodes, body aches, joint swelling, chest pain, shortness of breath, mood changes. POSITIVE muscle aches ? ?Objective  ?Blood pressure (!) 136/92, pulse 94, height  '5\' 8"'$  (1.727 m), weight 184 lb (83.5 kg), SpO2 97 %. ?  ?General: No apparent distress alert and oriented x3 mood and affect normal, dressed appropriately.  ?HEENT: Pupils equal, extraocular movements intact  ?Respiratory: Patient's speak in full sentences and does not appear short of breath  ?Cardiovascular: No lower extremity edema, non tender, no erythema  ?Gait normal with good balance and coordination.  ?MSK: Patient does have significant hypermobility noted.  This is of the shoulders, elbows, wrists, hands not as much of the lower body except for the ankles.  Beighton score is somewhere between 8 and 9 neurovascularly intact distally.  There are some very mild discomfort to palpation of the back. ? ?  ?Impression and Recommendations:  ?  ?The above documentation has been reviewed and is accurate and complete Lyndal Pulley, DO ? ? ? ?

## 2021-06-06 ENCOUNTER — Ambulatory Visit: Payer: 59 | Admitting: Family Medicine

## 2021-06-06 ENCOUNTER — Encounter: Payer: Self-pay | Admitting: Family Medicine

## 2021-06-06 VITALS — BP 136/92 | HR 94 | Ht 68.0 in | Wt 184.0 lb

## 2021-06-06 DIAGNOSIS — M357 Hypermobility syndrome: Secondary | ICD-10-CM | POA: Diagnosis not present

## 2021-06-06 DIAGNOSIS — I159 Secondary hypertension, unspecified: Secondary | ICD-10-CM

## 2021-06-06 DIAGNOSIS — I10 Essential (primary) hypertension: Secondary | ICD-10-CM | POA: Diagnosis not present

## 2021-06-06 NOTE — Assessment & Plan Note (Signed)
Patient does have a Beighton score of 9.  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  Do not feel any subluxations but has had dislocation of the shoulder previously.  We will see him syndrome is within the differential of type III.  Patient does have a strong family history of his father having heart conditions as well.  I did discuss with patient about different things and I do feel that a screening echocardiogram could be beneficial to further evaluate the ascending aorta.  Depending on findings we will see.  Patient did have a very mild murmur previously on a different physical exam.  Patient is going to continue to stay active otherwise.  Continue to try to get control of patient's gout.  I do think that that could be more beneficial with the medications he was previously put on by the rheumatologist as well as.  Follow-up with me again in 6 to 8 weeks ?

## 2021-06-06 NOTE — Patient Instructions (Addendum)
Echocardiogram: Hypertension ?Vit C '1000mg'$  daily ?Vit D 2000IU daily ?[facility] at home ?[facility] Total Support ?Tart cherry extract '1200mg'$  at night ?See me again in 8 weeks ?

## 2021-06-06 NOTE — Telephone Encounter (Signed)
Contacted patient a few days ago and advised to come back in for a second testosterone level. Patient returned to office on 3/28 to have drawn. Recorded levels and answered questions on paperwork and faxed back to [facility]. Awaiting response.  ?

## 2021-06-07 ENCOUNTER — Encounter: Payer: Self-pay | Admitting: Family Medicine

## 2021-06-07 NOTE — Addendum Note (Signed)
Addended by: Judy Pimple R on: 06/07/2021 11:52 AM ? ? Modules accepted: Orders ? ?

## 2021-06-08 LAB — TESTOSTERONE,FREE AND TOTAL
Testosterone, Free: 8.7 pg/mL (ref 8.7–25.1)
Testosterone: 377 ng/dL (ref 264–916)

## 2021-06-13 NOTE — Progress Notes (Deleted)
Office Visit Note  Patient: Charles [name]             Date of Birth: September 20, 1990           MRN: [id number]             PCP: Chevis Pretty, FNP Referring: Chevis Pretty, * Visit Date: 06/26/2021 Occupation: @GUAROCC @  Subjective:  No chief complaint on file.   History of Present Illness: Charles [name] is a 31 y.o. male ***   Activities of Daily Living:  Patient reports morning stiffness for *** {minute/hour:19697}.   Patient {ACTIONS;DENIES/REPORTS:21021675::"Denies"} nocturnal pain.  Difficulty dressing/grooming: {ACTIONS;DENIES/REPORTS:21021675::"Denies"} Difficulty climbing stairs: {ACTIONS;DENIES/REPORTS:21021675::"Denies"} Difficulty getting out of chair: {ACTIONS;DENIES/REPORTS:21021675::"Denies"} Difficulty using hands for taps, buttons, cutlery, and/or writing: {ACTIONS;DENIES/REPORTS:21021675::"Denies"}  No Rheumatology ROS completed.   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 04/17/2012    Past Medical History:  Diagnosis Date   ADD (attention deficit disorder) f   Allergic rhinitis    Gynecomastia, male     Family History  Problem Relation Age of Onset   Depression Mother    Heart Problems Father    Hypertension Father    Healthy Sister    Cancer Maternal Aunt        breast   Heart attack Maternal Grandfather    Diabetes Paternal Grandmother    Diabetes Paternal Grandfather    Hyperlipidemia Paternal Grandfather    Hypertension Paternal Grandfather    Heart disease Paternal Grandfather    Past Surgical History:  Procedure Laterality Date   KNEE ARTHROSCOPY W/ MENISCECTOMY  11/23/2005   left knee - extensive bucket handle tear of medial meniscus    SHOULDER ARTHROSCOPY W/ BANKART PROCEDURE Right 08/10/2006   TONSILLECTOMY  09/16/1995   Social History   Social History Narrative   Not on file   Immunization History  Administered Date(s) Administered   DTaP 02/21/1989, 05/02/1989, 07/11/1989, 06/30/1990, 07/25/1993   Hepatitis A 06/17/2005, 12/27/2005   Hepatitis B 07/28/1992, 09/21/1992, 02/07/1993   HiB (PRP-OMP) 02/21/1989, 05/02/1989, 07/11/1989, 06/30/1990   IPV 02/21/1989, 05/02/1989, 06/30/1990, 07/25/1993   Influenza,Quad,Nasal, Live 12/31/2012, 02/07/2014, 01/22/2018   Influenza,inj,Quad PF,6+ Mos 12/23/2014, 01/28/2017, 01/25/2019   MMR 04/09/1990, 07/25/1993   Meningococcal Conjugate 12/27/2005   Tdap 12/22/2006     Objective: Vital Signs: There were no vitals taken for this visit.   Physical Exam   Musculoskeletal Exam: ***  CDAI Exam: CDAI Score: -- Patient Global: --; Provider Global: -- Swollen: --; Tender: -- Joint Exam 06/26/2021   No joint exam has been documented for this visit   There is currently no information documented on the homunculus. Go to the Rheumatology activity and complete the homunculus joint exam.  Investigation: No additional findings.  Imaging: No results found.  Recent Labs: Lab Results  Component Value Date   WBC 7.1 05/10/2021   HGB 17.8 (H) 05/10/2021   PLT 333 05/10/2021   NA 142 05/10/2021   K 3.9 05/10/2021   CL 105 05/10/2021   CO2 27 05/10/2021   GLUCOSE 96 05/10/2021   BUN 8 05/10/2021   CREATININE 1.05 05/10/2021   BILITOT 0.6 05/10/2021   AST 24 05/10/2021   ALT 37 05/10/2021   PROT 6.7 05/10/2021   CALCIUM 9.9 05/10/2021  Speciality Comments: No specialty comments available.  Procedures:  No procedures performed Allergies: Indomethacin and Lamictal [lamotrigine]   Assessment / Plan:     Visit Diagnoses: No diagnosis found.  Orders: No orders of the defined types were placed in this encounter.  No orders of the defined types were placed  in this encounter.   Face-to-face time spent with patient was *** minutes. Greater than 50% of time was spent in counseling and coordination of care.  Follow-Up Instructions: No follow-ups on file.   Earnestine Mealing, CMA  Note - This record has been created using Editor, commissioning.  Chart creation errors have been sought, but may not always  have been located. Such creation errors do not reflect on  the standard of medical care.

## 2021-06-18 ENCOUNTER — Ambulatory Visit (HOSPITAL_COMMUNITY): Payer: 59 | Attending: Internal Medicine

## 2021-06-18 DIAGNOSIS — I10 Essential (primary) hypertension: Secondary | ICD-10-CM | POA: Diagnosis present

## 2021-06-18 LAB — ECHOCARDIOGRAM COMPLETE
Area-P 1/2: 3.72 cm2
S' Lateral: 2 cm

## 2021-06-21 NOTE — Progress Notes (Signed)
? ?Office Visit Note ? ?Patient: Charles [name]             ?Date of Birth: 02/10/1991           ?MRN: [id number]             ?PCP: Chevis Pretty, FNP ?Referring: Hassell Done, Mary-Margaret, * ?Visit Date: 06/27/2021 ?Occupation: @GUAROCC @ ? ?Subjective:  ?Medication monitoring  ? ?History of Present Illness: Charles [name] is a 31 y.o. male with history of gout. He is taking allopurinol 300 mg by mouth daily.  He is tolerating allopurinol without any side effects.  He continues to take colchicine 0.6 mg 1 tablet by mouth daily.  He has not had any signs or symptoms of a gout flare since his last office visit so he has not needed to take colchicine twice daily.  He continues to experience mild morning stiffness first thing in the morning which typically resolves within a few minutes.  He has occasional arthralgias especially after standing for prolonged periods of time at work.  He denies any other new or worsening symptoms.   ?Patient reports that he establish care with Dr. Tamala Julian and has been diagnosed with hyperflexibility.  He states that he was referred to cardiology and had an echocardiogram on 06/18/2021 which was unremarkable.  He has an upcoming appointment with ophthalmology to complete ? ? ? ?Activities of Daily Living:  ?Patient reports morning stiffness for 3 minutes.   ?Patient Denies nocturnal pain.  ?Difficulty dressing/grooming: Denies ?Difficulty climbing stairs: Reports ?Difficulty getting out of chair: Denies ?Difficulty using hands for taps, buttons, cutlery, and/or writing: Denies ? ?Review of Systems  ?Constitutional:  Positive for fatigue.  ?HENT:  Positive for nosebleeds and mouth dryness.   ?Eyes:  Negative for dryness.  ?Respiratory:  Negative for shortness of breath.   ?Cardiovascular:  Negative for swelling in legs/feet.  ?Gastrointestinal:  Negative for constipation.  ?Endocrine: Negative for increased urination.  ?Genitourinary:  Negative for difficulty urinating.  ?Musculoskeletal:   Positive for joint pain, joint pain and morning stiffness.  ?Skin:  Negative for rash.  ?Allergic/Immunologic: Negative for susceptible to infections.  ?Neurological:  Negative for numbness.  ?Hematological:  Negative for bruising/bleeding tendency.  ?Psychiatric/Behavioral:  Negative for sleep disturbance.   ? ?PMFS History:  ?Patient Active Problem List  ? Diagnosis Date Noted  ? Hypermobility syndrome 06/06/2021  ? Recurrent major depressive disorder, in partial remission ([place]) 01/28/2017  ? Hypogonadism in male 01/28/2017  ? Attention deficit hyperactivity disorder (ADHD), combined type 08/03/2014  ? 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  ?Past Medical History:  ?Diagnosis Date  ? ADD (attention deficit disorder) f  ? Allergic rhinitis   ? Gout   ? Gynecomastia, male   ?  ?Family History  ?Problem Relation Age of Onset  ? Depression Mother   ? Heart Problems Father   ? Hypertension Father   ? Healthy Sister   ? Cancer Maternal Aunt   ?     breast  ? Heart attack Maternal Grandfather   ? Diabetes Paternal Grandmother   ? Diabetes Paternal Grandfather   ? Hyperlipidemia Paternal Grandfather   ? Hypertension Paternal Grandfather   ? Heart disease Paternal Grandfather   ? ?Past Surgical History:  ?Procedure Laterality Date  ? KNEE ARTHROSCOPY W/ MENISCECTOMY  11/23/2005  ? left knee - extensive bucket handle tear of medial meniscus  ? SHOULDER ARTHROSCOPY W/ BANKART PROCEDURE Right 08/10/2006  ? TONSILLECTOMY  09/16/1995  ? WISDOM TOOTH  EXTRACTION    ? ?Social History  ? ?Social History Narrative  ? Not on file  ? ?Immunization History  ?Administered Date(s) Administered  ? DTaP 02/21/1989, 05/02/1989, 07/11/1989, 06/30/1990, 07/25/1993  ? Hepatitis A 06/17/2005, 12/27/2005  ? Hepatitis B 07/28/1992, 09/21/1992, 02/07/1993  ? HiB (PRP-OMP) 02/21/1989, 05/02/1989, 07/11/1989, 06/30/1990  ? IPV 02/21/1989, 05/02/1989, 06/30/1990, 07/25/1993  ? Influenza,Quad,Nasal, Live 12/31/2012, 02/07/2014, 01/22/2018  ?  Influenza,inj,Quad PF,6+ Mos 12/23/2014, 01/28/2017, 01/25/2019  ? MMR 04/09/1990, 07/25/1993  ? Meningococcal Conjugate 12/27/2005  ? Tdap 12/22/2006  ?  ? ?Objective: ?Vital Signs: BP (!) 153/83 (BP Location: Left Arm, Patient Position: Sitting, Cuff Size: Normal)   Pulse 80   Resp 15   Ht 5' 8"  (1.727 m)   Wt 183 lb (83 kg)   BMI 27.83 kg/m?   ? ?Physical Exam ?Vitals and nursing note reviewed.  ?Constitutional:   ?   Appearance: He is well-developed.  ?HENT:  ?   Head: Normocephalic and atraumatic.  ?Eyes:  ?   Conjunctiva/sclera: Conjunctivae normal.  ?   Pupils: Pupils are equal, round, and reactive to light.  ?Cardiovascular:  ?   Rate and Rhythm: Normal rate and regular rhythm.  ?   Heart sounds: Normal heart sounds.  ?Pulmonary:  ?   Effort: Pulmonary effort is normal.  ?   Breath sounds: Normal breath sounds.  ?Abdominal:  ?   General: Bowel sounds are normal.  ?   Palpations: Abdomen is soft.  ?Musculoskeletal:  ?   Cervical back: Normal range of motion and neck supple.  ?Skin: ?   General: Skin is warm and dry.  ?   Capillary Refill: Capillary refill takes less than 2 seconds.  ?Neurological:  ?   Mental Status: He is alert and oriented to person, place, and time.  ?Psychiatric:     ?   Behavior: Behavior normal.  ?  ? ?Musculoskeletal Exam: C-spine, thoracic spine, lumbar spine have good range of motion with no discomfort.  No midline spinal tenderness or SI joint tenderness.  Shoulder joints, elbow joints, wrist joints, MCPs, PIPs, DIPs have good range of motion with no synovitis.  Hyperflexibility noted with range of motion exercises.  Hip joints have good range of motion with no groin.  Left knee has a small effusion.  Right knee has good ROM with no warmth or effusion.  Ankle joints have good ROM with no tenderness or joint swelling.  No tenderness over MTP joints.  ? ?CDAI Exam: ?CDAI Score: -- ?Patient Global: --; Provider Global: -- ?Swollen: --; Tender: -- ?Joint Exam 06/27/2021  ? ?No  joint exam has been documented for this visit  ? ?There is currently no information documented on the homunculus. Go to the Rheumatology activity and complete the homunculus joint exam. ? ?Investigation: ?No additional findings. ? ?Imaging: ?ECHOCARDIOGRAM COMPLETE ? ?Result Date: 06/18/2021 ?   ECHOCARDIOGRAM REPORT   Patient Name:   DIVIT STIPP Date of Exam: 06/18/2021 Medical Rec #:  [id number]     Height:       68.0 in Accession #:    [id number]    Weight:       184.0 lb Date of Birth:  1990/11/24    BSA:          1.973 m? Patient Age:    30 years      BP:           136/92 mmHg Patient Gender: M  HR:           67 bpm. Exam Location:  [address] Procedure: 2D Echo, Cardiac Doppler and Color Doppler Indications:    I27.20 Pulmonary Hypertension  History:        Patient has no prior history of Echocardiogram examinations.                 Risk Factors:Hypertension.  Sonographer:    Diamond Nickel RCS Referring Phys: [place]  1. Left ventricular ejection fraction, by estimation, is 60 to 65%. The left ventricle has normal function. The left ventricle has no regional wall motion abnormalities. There is mild left ventricular hypertrophy. Left ventricular diastolic parameters were normal.  2. Right ventricular systolic function is normal. The right ventricular size is normal. Tricuspid regurgitation signal is inadequate for assessing PA pressure.  3. The mitral valve is grossly normal. No evidence of mitral valve regurgitation.  4. The aortic valve is tricuspid. Aortic valve regurgitation is not visualized.  5. The inferior vena cava is normal in size with greater than 50% respiratory variability, suggesting right atrial pressure of 3 mmHg. Comparison(s): No prior Echocardiogram. FINDINGS  Left Ventricle: Left ventricular ejection fraction, by estimation, is 60 to 65%. The left ventricle has normal function. The left ventricle has no regional wall motion abnormalities. The left  ventricular internal cavity size was normal in size. There is  mild left ventricular hypertrophy. Left ventricular diastolic parameters were normal. Right Ventricle: The right ventricular size is normal. No increase in

## 2021-06-22 NOTE — Telephone Encounter (Signed)
Denied Per insurance noes not meet requirements  ?

## 2021-06-25 MED ORDER — TESTOSTERONE CYPIONATE 200 MG/ML IM SOLN
100.0000 mg | INTRAMUSCULAR | 0 refills | Status: DC
Start: 2021-06-25 — End: 2021-08-02

## 2021-06-25 NOTE — Telephone Encounter (Signed)
Patient notified

## 2021-06-25 NOTE — Addendum Note (Signed)
Addended by: Chevis Pretty on: 06/25/2021 04:42 PM ? ? Modules accepted: Orders ? ?

## 2021-06-25 NOTE — Telephone Encounter (Signed)
Prescription for injections sent to pharmacy ?

## 2021-06-25 NOTE — Telephone Encounter (Signed)
Insurance will not cover patches. Will you send in injections and we will go from there ?

## 2021-06-25 NOTE — Telephone Encounter (Signed)
Insurance will not pay for testosterone injections ?

## 2021-06-26 ENCOUNTER — Ambulatory Visit: Payer: 59 | Admitting: Physician Assistant

## 2021-06-26 DIAGNOSIS — D582 Other hemoglobinopathies: Secondary | ICD-10-CM

## 2021-06-26 DIAGNOSIS — Z720 Tobacco use: Secondary | ICD-10-CM

## 2021-06-26 DIAGNOSIS — M1A0790 Idiopathic chronic gout, unspecified ankle and foot, without tophus (tophi): Secondary | ICD-10-CM

## 2021-06-26 DIAGNOSIS — R030 Elevated blood-pressure reading, without diagnosis of hypertension: Secondary | ICD-10-CM

## 2021-06-26 DIAGNOSIS — Z8659 Personal history of other mental and behavioral disorders: Secondary | ICD-10-CM

## 2021-06-26 DIAGNOSIS — F902 Attention-deficit hyperactivity disorder, combined type: Secondary | ICD-10-CM

## 2021-06-26 DIAGNOSIS — Z79899 Other long term (current) drug therapy: Secondary | ICD-10-CM

## 2021-06-26 DIAGNOSIS — F3341 Major depressive disorder, recurrent, in partial remission: Secondary | ICD-10-CM

## 2021-06-26 DIAGNOSIS — I7300 Raynaud's syndrome without gangrene: Secondary | ICD-10-CM

## 2021-06-26 DIAGNOSIS — E291 Testicular hypofunction: Secondary | ICD-10-CM

## 2021-06-26 DIAGNOSIS — Z789 Other specified health status: Secondary | ICD-10-CM

## 2021-06-26 DIAGNOSIS — M25571 Pain in right ankle and joints of right foot: Secondary | ICD-10-CM

## 2021-06-26 DIAGNOSIS — N62 Hypertrophy of breast: Secondary | ICD-10-CM

## 2021-06-26 DIAGNOSIS — M249 Joint derangement, unspecified: Secondary | ICD-10-CM

## 2021-06-27 ENCOUNTER — Encounter: Payer: Self-pay | Admitting: Physician Assistant

## 2021-06-27 ENCOUNTER — Ambulatory Visit: Payer: 59 | Admitting: Physician Assistant

## 2021-06-27 VITALS — BP 153/83 | HR 80 | Resp 15 | Ht 68.0 in | Wt 183.0 lb

## 2021-06-27 DIAGNOSIS — Z8659 Personal history of other mental and behavioral disorders: Secondary | ICD-10-CM

## 2021-06-27 DIAGNOSIS — Z720 Tobacco use: Secondary | ICD-10-CM

## 2021-06-27 DIAGNOSIS — E291 Testicular hypofunction: Secondary | ICD-10-CM

## 2021-06-27 DIAGNOSIS — D582 Other hemoglobinopathies: Secondary | ICD-10-CM | POA: Diagnosis not present

## 2021-06-27 DIAGNOSIS — M1A0710 Idiopathic chronic gout, right ankle and foot, without tophus (tophi): Secondary | ICD-10-CM

## 2021-06-27 DIAGNOSIS — F902 Attention-deficit hyperactivity disorder, combined type: Secondary | ICD-10-CM

## 2021-06-27 DIAGNOSIS — Z5181 Encounter for therapeutic drug level monitoring: Secondary | ICD-10-CM

## 2021-06-27 DIAGNOSIS — R030 Elevated blood-pressure reading, without diagnosis of hypertension: Secondary | ICD-10-CM

## 2021-06-27 DIAGNOSIS — Z789 Other specified health status: Secondary | ICD-10-CM

## 2021-06-27 DIAGNOSIS — F1090 Alcohol use, unspecified, uncomplicated: Secondary | ICD-10-CM

## 2021-06-27 DIAGNOSIS — I7300 Raynaud's syndrome without gangrene: Secondary | ICD-10-CM | POA: Diagnosis not present

## 2021-06-27 DIAGNOSIS — M249 Joint derangement, unspecified: Secondary | ICD-10-CM

## 2021-06-27 DIAGNOSIS — Z79899 Other long term (current) drug therapy: Secondary | ICD-10-CM

## 2021-06-27 DIAGNOSIS — M1A0790 Idiopathic chronic gout, unspecified ankle and foot, without tophus (tophi): Secondary | ICD-10-CM

## 2021-06-27 DIAGNOSIS — F3341 Major depressive disorder, recurrent, in partial remission: Secondary | ICD-10-CM

## 2021-06-27 DIAGNOSIS — N62 Hypertrophy of breast: Secondary | ICD-10-CM

## 2021-06-27 DIAGNOSIS — M25571 Pain in right ankle and joints of right foot: Secondary | ICD-10-CM

## 2021-06-27 MED ORDER — ALLOPURINOL 300 MG PO TABS: 300.0000 mg | ORAL_TABLET | Freq: Every day | ORAL | 0 refills | Status: DC

## 2021-06-28 LAB — COMPLETE METABOLIC PANEL WITH GFR
AG Ratio: 2.3 (calc) (ref 1.0–2.5)
ALT: 39 U/L (ref 9–46)
AST: 21 U/L (ref 10–40)
Albumin: 4.6 g/dL (ref 3.6–5.1)
Alkaline phosphatase (APISO): 72 U/L (ref 36–130)
BUN: 13 mg/dL (ref 7–25)
CO2: 27 mmol/L (ref 20–32)
Calcium: 9.8 mg/dL (ref 8.6–10.3)
Chloride: 102 mmol/L (ref 98–110)
Creat: 0.97 mg/dL (ref 0.60–1.26)
Globulin: 2 g/dL (calc) (ref 1.9–3.7)
Glucose, Bld: 80 mg/dL (ref 65–99)
Potassium: 4.1 mmol/L (ref 3.5–5.3)
Sodium: 142 mmol/L (ref 135–146)
Total Bilirubin: 0.5 mg/dL (ref 0.2–1.2)
Total Protein: 6.6 g/dL (ref 6.1–8.1)
eGFR: 106 mL/min/{1.73_m2} (ref >=60)

## 2021-06-28 LAB — CBC WITH DIFFERENTIAL/PLATELET
Absolute Monocytes: 447 cells/uL (ref 200–950)
Basophils Absolute: 29 cells/uL (ref 0–200)
Basophils Relative: 0.5 %
Eosinophils Absolute: 139 cells/uL (ref 15–500)
Eosinophils Relative: 2.4 %
HCT: 50.4 % — ABNORMAL HIGH (ref 38.5–50.0)
Hemoglobin: 17.1 g/dL (ref 13.2–17.1)
Lymphs Abs: 1670 cells/uL (ref 850–3900)
MCH: 30.5 pg (ref 27.0–33.0)
MCHC: 33.9 g/dL (ref 32.0–36.0)
MCV: 90 fL (ref 80.0–100.0)
MPV: 10.2 fL (ref 7.5–12.5)
Monocytes Relative: 7.7 %
Neutro Abs: 3515 cells/uL (ref 1500–7800)
Neutrophils Relative %: 60.6 %
Platelets: 325 10*3/uL (ref 140–400)
RBC: 5.6 10*6/uL (ref 4.20–5.80)
RDW: 12.8 % (ref 11.0–15.0)
Total Lymphocyte: 28.8 %
WBC: 5.8 10*3/uL (ref 3.8–10.8)

## 2021-06-28 LAB — URIC ACID: Uric Acid, Serum: 6.3 mg/dL (ref 4.0–8.0)

## 2021-06-28 NOTE — Progress Notes (Signed)
CBC and CMP WNL. Uric acid has improved-6.3.  continue on current dose of allopurinol as prescribed.

## 2021-07-27 NOTE — Progress Notes (Signed)
[place] Phone: [phone] Subjective:   Charles [name], am serving as a scribe for Dr. Hulan Saas.  This visit occurred during the SARS-CoV-2 public health emergency.  Safety protocols were in place, including screening questions prior to the visit, additional usage of staff PPE, and extensive cleaning of exam room while observing appropriate contact time as indicated for disinfecting solutions.    I'm seeing this patient by the request  of:  Chevis Pretty, FNP  CC: Low back pain follow-up hypermobility follow-up  KGY:[id number]  06/06/2021 Patient does have a Beighton score of 9.  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  Do not feel any subluxations but has had dislocation of the shoulder previously.  We will see him syndrome is within the differential of type III.  Patient does have a strong family history of his father having heart conditions as well.  I did discuss with patient about different things and I do feel that a screening echocardiogram could be beneficial to further evaluate the ascending aorta.  Depending on findings we will see.  Patient did have a very mild murmur previously on a different physical exam.  Patient is going to continue to stay active otherwise.  Continue to try to get control of patient's gout.  I do think that that could be more beneficial with the medications he was previously put on by the rheumatologist as well as.  Follow-up with me again in 6 to 8 weeks  Updated 07/30/2021 Charles [name] is a 31 y.o. male coming in with complaint of hypermobility. Patient states that he is the same as last visit. BP has been running 150/100 on several readings.  Patient denies any significant headaches, any dizziness, lightheadedness.  Patient did receive the ophthalmologist today and did check the note.  Found to have minor elevation in pressure but otherwise unremarkable.  [name] lens  findings that is concerning.       Past Medical History:  Diagnosis Date   ADD (attention deficit disorder) f   Allergic rhinitis    Gout    Gynecomastia, male    Past Surgical History:  Procedure Laterality Date   KNEE ARTHROSCOPY W/ MENISCECTOMY  11/23/2005   left knee - extensive bucket handle tear of medial meniscus   SHOULDER ARTHROSCOPY W/ BANKART PROCEDURE Right 08/10/2006   TONSILLECTOMY  09/16/1995   WISDOM TOOTH EXTRACTION     Social History   Socioeconomic History   Marital status: Single    Spouse name: Not on file   Number of children: Not on file   Years of education: Not on file   Highest education level: Not on file  Occupational History   Not on file  Tobacco Use   Smoking status: Never    Passive exposure: Never   Smokeless tobacco: Current    Types: Chew  Vaping Use   Vaping Use: Never used  Substance and Sexual Activity   Alcohol use: Yes    Alcohol/week: 6.0 standard drinks    Types: 6 Cans of beer per week   Drug use: [name]   Sexual activity: Not on file  Other Topics Concern   Not on file  Social History Narrative   Not on file   Social Determinants of Health   Financial Resource Strain: Not on file  Food Insecurity: Not on file  Transportation Needs: Not on file  Physical Activity: Not on file  Stress: Not on file  Social Connections: Not on file   Allergies  Allergen Reactions   Indomethacin     Brain fog, balance issues, cognitive dissonance.    Lamictal [Lamotrigine] Other (See Comments)    "Muscle Spasms in back"   Family History  Problem Relation Age of Onset   Depression Mother    Heart Problems Father    Hypertension Father    Healthy Sister    Cancer Maternal Aunt        breast   Heart attack Maternal Grandfather    Diabetes Paternal Grandmother    Diabetes Paternal Grandfather    Hyperlipidemia Paternal Grandfather    Hypertension Paternal Grandfather    Heart disease Paternal Grandfather     Current  Outpatient Medications (Endocrine & Metabolic):    testosterone (ANDRODERM) 4 MG/24HR PT24 patch, Place 1 patch onto the skin daily.   testosterone cypionate (DEPOTESTOSTERONE CYPIONATE) 200 MG/ML injection, Inject 0.5 mLs (100 mg total) into the muscle every 14 (fourteen) days.  Current Outpatient Medications (Cardiovascular):    EPINEPHrine 0.3 mg/0.3 mL IJ SOAJ injection, Inject 0.3 mLs (0.3 mg total) into the muscle once as needed for up to 1 dose (Tongue or throat swelling).  Current Outpatient Medications (Respiratory):    diphenhydrAMINE (BENADRYL) 25 MG tablet, Take 1 tablet (25 mg total) by mouth every 6 (six) hours as needed for itching.  Current Outpatient Medications (Analgesics):    allopurinol (ZYLOPRIM) 300 MG tablet, Take 1 tablet (300 mg total) by mouth daily.   colchicine 0.6 MG tablet, Take 0.'6mg'$  po qd, increase to BID for flares.   Current Outpatient Medications (Other):    ARIPiprazole (ABILIFY) 10 MG tablet, Take 1 tablet (10 mg total) by mouth daily.   citalopram (CELEXA) 40 MG tablet, Take 1 tablet (40 mg total) by mouth daily.   valACYclovir (VALTREX) 1000 MG tablet, Take 1 tablet (1,000 mg total) by mouth 2 (two) times daily as needed. Prn as needed for fever blisters   amphetamine-dextroamphetamine (ADDERALL XR) 30 MG 24 hr capsule, Take 1 capsule (30 mg total) by mouth every morning.   amphetamine-dextroamphetamine (ADDERALL XR) 30 MG 24 hr capsule, Take 1 capsule (30 mg total) by mouth every morning.   Reviewed prior external information including notes and imaging from  primary care provider As well as notes that were available from care everywhere and other healthcare systems.  Past medical history, social, surgical and family history all reviewed in electronic medical record.  [name] pertanent information unless stated regarding to the chief complaint.   Review of Systems:  [name] headache, visual changes, nausea, vomiting, diarrhea, constipation, dizziness,  abdominal pain, skin rash, fevers, chills, night sweats, weight loss, swollen lymph nodes, body aches, joint swelling, chest pain, shortness of breath, mood changes. POSITIVE muscle aches  Objective  Blood pressure (!) 138/92, pulse 99, height '5\' 8"'$  (1.727 m), weight 192 lb (87.1 kg), SpO2 98 %.   General: [name] apparent distress alert and oriented x3 mood and affect normal, dressed appropriately.  HEENT: Pupils equal, extraocular movements intact  Respiratory: Patient's speak in full sentences and does not appear short of breath  Cardiovascular: [name] lower extremity edema, non tender, [name] erythema  MSK: Patient does have hypermobility noted.  Still fairly severe. Back exam does have some loss of lordosis.  Some tenderness to palpation in the paraspinal musculature.  Patient does have full range of motion of the neck as well.   Osteopathic findings  T3 extended rotated and side bent right inhaled third rib  T9 extended rotated and side bent left L2 flexed rotated and side bent right Sacrum right on right    Impression and Recommendations:     The above documentation has been reviewed and is accurate and complete Lyndal Pulley, DO

## 2021-07-30 ENCOUNTER — Ambulatory Visit: Payer: 59 | Admitting: Family Medicine

## 2021-07-30 ENCOUNTER — Encounter: Payer: Self-pay | Admitting: Family Medicine

## 2021-07-30 VITALS — BP 138/92 | HR 99 | Ht 68.0 in | Wt 192.0 lb

## 2021-07-30 DIAGNOSIS — M357 Hypermobility syndrome: Secondary | ICD-10-CM | POA: Diagnosis not present

## 2021-07-30 DIAGNOSIS — M9904 Segmental and somatic dysfunction of sacral region: Secondary | ICD-10-CM

## 2021-07-30 DIAGNOSIS — M9903 Segmental and somatic dysfunction of lumbar region: Secondary | ICD-10-CM

## 2021-07-30 DIAGNOSIS — M9902 Segmental and somatic dysfunction of thoracic region: Secondary | ICD-10-CM

## 2021-07-30 NOTE — Assessment & Plan Note (Signed)

## 2021-07-30 NOTE — Patient Instructions (Signed)
Good to see you Have other docs keep eye on BP Overall appears echo is normal Lens of eye didn't show anything concerning  Keep active See me in 2 months

## 2021-07-30 NOTE — Assessment & Plan Note (Signed)
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  Does have elevated blood pressure, patient so echocardiogram was normal and aortic arch is unremarkable.  Patient has not had any other dislocations noted.  Eye exam showed some elevated blood pressure but no significant lens findings at the moment.  We will continue to monitor at this moment with conservative therapy.  Patient did ask about the possibility of genetic testing.  We discussed we can do it but do not know how it would change management except for potentially helping him know for his kids.  Patient will consider it at the moment.  Follow-up again in 2 months

## 2021-08-02 ENCOUNTER — Ambulatory Visit: Payer: 59 | Admitting: Nurse Practitioner

## 2021-08-02 ENCOUNTER — Encounter: Payer: Self-pay | Admitting: Nurse Practitioner

## 2021-08-02 VITALS — BP 126/77 | HR 73 | Temp 98.0°F | Resp 20 | Ht 68.0 in | Wt 191.0 lb

## 2021-08-02 DIAGNOSIS — E291 Testicular hypofunction: Secondary | ICD-10-CM | POA: Diagnosis not present

## 2021-08-02 DIAGNOSIS — F902 Attention-deficit hyperactivity disorder, combined type: Secondary | ICD-10-CM

## 2021-08-02 DIAGNOSIS — F3341 Major depressive disorder, recurrent, in partial remission: Secondary | ICD-10-CM | POA: Diagnosis not present

## 2021-08-02 MED ORDER — AMPHETAMINE-DEXTROAMPHET ER 30 MG PO CP24
30.0000 mg | ORAL_CAPSULE | ORAL | 0 refills | Status: DC
Start: 2021-09-18 — End: 2022-02-04

## 2021-08-02 MED ORDER — AMPHETAMINE-DEXTROAMPHET ER 30 MG PO CP24: 30.0000 mg | ORAL_CAPSULE | ORAL | 0 refills | Status: DC

## 2021-08-02 MED ORDER — TESTOSTERONE CYPIONATE 200 MG/ML IM SOLN: 100.0000 mg | INTRAMUSCULAR | 1 refills | Status: DC

## 2021-08-02 NOTE — Patient Instructions (Signed)

## 2021-08-02 NOTE — Progress Notes (Signed)
Subjective:    Patient ID: Charles [name], male    DOB: 1990/11/22, 31 y.o.   MRN: [id number]   Chief Complaint: Medical Management of Chronic Issues    HPI:  Charles [name] is a 31 y.o. who identifies as a male who was assigned male at birth.   Social history: Lives with: girlfriend   Comes in today for follow up of the following chronic medical issues:  1. Attention deficit hyperactivity disorder (ADHD), combined type He has been on adderall for many years off and on. He is not able to concentrate t work without meds. He denies any medication side effects.  2. Recurrent major depressive disorder, in partial remission ([facility]) Is on a combination of abilify and celexa. Says hse is doing well.    08/02/2021   12:23 PM 04/04/2021   12:38 PM 12/18/2020    8:14 AM  Depression screen PHQ 2/9  Decreased Interest 1 0 0  Down, Depressed, Hopeless 0 1 0  PHQ - 2 Score 1 1 0  Altered sleeping 1 1   Tired, decreased energy 0 1   Change in appetite 0 0   Feeling bad or failure about yourself  0 1   Trouble concentrating 0 0   Moving slowly or fidgety/restless 0 0   Suicidal thoughts 0 0   PHQ-9 Score 2 4   Difficult doing work/chores Not difficult at all Somewhat difficult      3. Hypogonadism in male Is on testosterone injections every 14 days. Says he is doing well.  Lab Results  Component Value Date   TESTOSTERONE 377 06/05/2021      New complaints: None today  Allergies  Allergen Reactions   Indomethacin     Brain fog, balance issues, cognitive dissonance.    Lamictal [Lamotrigine] Other (See Comments)    "Muscle Spasms in back"   Outpatient Encounter Medications as of 08/02/2021  Medication Sig   allopurinol (ZYLOPRIM) 300 MG tablet Take 1 tablet (300 mg total) by mouth daily.   ARIPiprazole (ABILIFY) 10 MG tablet Take 1 tablet (10 mg total) by mouth daily.   citalopram (CELEXA) 40 MG tablet Take 1 tablet (40 mg total) by mouth daily.   colchicine 0.6 MG  tablet Take 0.'6mg'$  po qd, increase to BID for flares.   diphenhydrAMINE (BENADRYL) 25 MG tablet Take 1 tablet (25 mg total) by mouth every 6 (six) hours as needed for itching.   EPINEPHrine 0.3 mg/0.3 mL IJ SOAJ injection Inject 0.3 mLs (0.3 mg total) into the muscle once as needed for up to 1 dose (Tongue or throat swelling).   testosterone cypionate (DEPOTESTOSTERONE CYPIONATE) 200 MG/ML injection Inject 0.5 mLs (100 mg total) into the muscle every 14 (fourteen) days.   valACYclovir (VALTREX) 1000 MG tablet Take 1 tablet (1,000 mg total) by mouth 2 (two) times daily as needed. Prn as needed for fever blisters   [DISCONTINUED] testosterone (ANDRODERM) 4 MG/24HR PT24 patch Place 1 patch onto the skin daily.   amphetamine-dextroamphetamine (ADDERALL XR) 30 MG 24 hr capsule Take 1 capsule (30 mg total) by mouth every morning.   amphetamine-dextroamphetamine (ADDERALL XR) 30 MG 24 hr capsule Take 1 capsule (30 mg total) by mouth every morning.   No facility-administered encounter medications on file as of 08/02/2021.    Past Surgical History:  Procedure Laterality Date   KNEE ARTHROSCOPY W/ MENISCECTOMY  11/23/2005   left knee - extensive bucket handle tear of medial meniscus   SHOULDER ARTHROSCOPY W/  BANKART PROCEDURE Right 08/10/2006   TONSILLECTOMY  09/16/1995   WISDOM TOOTH EXTRACTION      Family History  Problem Relation Age of Onset   Depression Mother    Heart Problems Father    Hypertension Father    Healthy Sister    Cancer Maternal Aunt        breast   Heart attack Maternal Grandfather    Diabetes Paternal Grandmother    Diabetes Paternal Grandfather    Hyperlipidemia Paternal Grandfather    Hypertension Paternal Grandfather    Heart disease Paternal Grandfather       Controlled substance contract: n/a     Review of Systems  Constitutional:  Negative for diaphoresis.  Eyes:  Negative for pain.  Respiratory:  Negative for shortness of breath.   Cardiovascular:   Negative for chest pain, palpitations and leg swelling.  Gastrointestinal:  Negative for abdominal pain.  Endocrine: Negative for polydipsia.  Skin:  Negative for rash.  Neurological:  Negative for dizziness, weakness and headaches.  Hematological:  Does not bruise/bleed easily.  All other systems reviewed and are negative.     Objective:   Physical Exam Vitals and nursing note reviewed.  Constitutional:      Appearance: Normal appearance. He is well-developed.  Neck:     Thyroid: No thyroid mass or thyromegaly.     Vascular: No carotid bruit or JVD.     Trachea: Phonation normal.  Cardiovascular:     Rate and Rhythm: Normal rate and regular rhythm.  Pulmonary:     Effort: Pulmonary effort is normal. No respiratory distress.     Breath sounds: Normal breath sounds.  Abdominal:     General: Bowel sounds are normal.     Palpations: Abdomen is soft.     Tenderness: There is no abdominal tenderness.  Musculoskeletal:        General: Normal range of motion.     Cervical back: Normal range of motion and neck supple.  Lymphadenopathy:     Cervical: No cervical adenopathy.  Skin:    General: Skin is warm and dry.  Neurological:     Mental Status: He is alert and oriented to person, place, and time.  Psychiatric:        Behavior: Behavior normal.        Thought Content: Thought content normal.        Judgment: Judgment normal.    BP 126/77   Pulse 73   Temp 98 F (36.7 C) (Temporal)   Resp 20   Ht '5\' 8"'$  (1.727 m)   Wt 191 lb (86.6 kg)   SpO2 96%   BMI 29.04 kg/m        Assessment & Plan:  [name] in today with chief complaint of Medical Management of Chronic Issues   1. Attention deficit hyperactivity disorder (ADHD), combined type Stress management - amphetamine-dextroamphetamine (ADDERALL XR) 30 MG 24 hr capsule; Take 1 capsule (30 mg total) by mouth every morning.  Dispense: 30 capsule; Refill: 0 - amphetamine-dextroamphetamine (ADDERALL XR) 30 MG 24 hr  capsule; Take 1 capsule (30 mg total) by mouth every morning.  Dispense: 30 capsule; Refill: 0 - amphetamine-dextroamphetamine (ADDERALL XR) 30 MG 24 hr capsule; Take 1 capsule (30 mg total) by mouth every morning.  Dispense: 30 capsule; Refill: 0  2. Recurrent major depressive disorder, in partial remission (HCC) Continue abilify and celexa daily  3. Hypogonadism in male Continue testosterone shots - testosterone cypionate (DEPOTESTOSTERONE CYPIONATE) 200 MG/ML injection;  Inject 0.5 mLs (100 mg total) into the muscle every 14 (fourteen) days.  Dispense: 10 mL; Refill: 1    The above assessment and management plan was discussed with the patient. The patient verbalized understanding of and has agreed to the management plan. Patient is aware to call the clinic if symptoms persist or worsen. Patient is aware when to return to the clinic for a follow-up visit. Patient educated on when it is appropriate to go to the emergency department.   Mary-Margaret Hassell Done, FNP

## 2021-09-06 ENCOUNTER — Telehealth: Payer: Self-pay

## 2021-09-06 NOTE — Telephone Encounter (Signed)
LM to RC  

## 2021-09-06 NOTE — Telephone Encounter (Signed)
Patient is on injections. This is an old request I believe. Please check with patient and make sure he is still doing injections.Charles [name]

## 2021-09-06 NOTE — Telephone Encounter (Signed)
Patient reports he is taking the injections

## 2021-09-06 NOTE — Telephone Encounter (Signed)
PT MUST TRY ALTERNATIVES FIRST: Androderm '4MG'$ /24HR 24 hr patches Required Testosterone Cypionate   Not Required AndroGel Pump    Required Harlene Salts     Required Natesto     Required Testim      Required Vogelxo     Required Vogelxo Pump     Required   r Pharmacy claim for Androderm '4MG'$ /24HR 24 hr patches, prescriber Hassell Done, has been rejected and requires prior authorization for coverage. Non-preferred medications may have a higher patient co-pay than the health insurance plan's preferred medications. If clinically appropriate, you may change the prescription. A therapeutic alternative may be available. Questions on alternatives? [facility] at [phone]. You can also review [employer]'s formulary online or call them directly. Compare the original medication with possible alternatives:

## 2021-09-14 ENCOUNTER — Telehealth: Payer: Self-pay | Admitting: Nurse Practitioner

## 2021-09-14 ENCOUNTER — Ambulatory Visit (INDEPENDENT_AMBULATORY_CARE_PROVIDER_SITE_OTHER): Payer: 59 | Admitting: Family Medicine

## 2021-09-14 ENCOUNTER — Encounter: Payer: Self-pay | Admitting: Family Medicine

## 2021-09-14 VITALS — BP 157/98 | HR 97 | Temp 98.2°F | Ht 68.0 in | Wt 193.0 lb

## 2021-09-14 DIAGNOSIS — R5383 Other fatigue: Secondary | ICD-10-CM

## 2021-09-14 DIAGNOSIS — R1013 Epigastric pain: Secondary | ICD-10-CM

## 2021-09-14 DIAGNOSIS — A09 Infectious gastroenteritis and colitis, unspecified: Secondary | ICD-10-CM

## 2021-09-14 DIAGNOSIS — I10 Essential (primary) hypertension: Secondary | ICD-10-CM | POA: Insufficient documentation

## 2021-09-14 DIAGNOSIS — R5381 Other malaise: Secondary | ICD-10-CM | POA: Diagnosis not present

## 2021-09-14 MED ORDER — AZITHROMYCIN 1 G PO PACK: 1.0000 g | PACK | Freq: Once | ORAL | 0 refills | Status: AC

## 2021-09-14 MED ORDER — HYDROCHLOROTHIAZIDE 25 MG PO TABS: 25.0000 mg | ORAL_TABLET | Freq: Every day | ORAL | 3 refills | Status: DC

## 2021-09-14 NOTE — Patient Instructions (Addendum)
Start low dose HCTZ as prescribed. Take one dose a zithromax as prescribe.  Pepcid 20 mg twice daily for next 2 weeks.   Goal BP:  For patients younger than 60: Goal BP < 140/90. For patients 60 and older: Goal BP < 150/90. For patients with diabetes: Goal BP < 140/90.  Take your medications faithfully as prescribed. Maintain a healthy weight. Get at least 150 minutes of aerobic exercise per week. Minimize salt intake, less than 2000 mg per day. Minimize alcohol intake.  DASH Eating Plan DASH stands for "Dietary Approaches to Stop Hypertension." The DASH eating plan is a healthy eating plan that has been shown to reduce high blood pressure (hypertension). Additional health benefits may include reducing the risk of type 2 diabetes mellitus, heart disease, and stroke. The DASH eating plan may also help with weight loss.  WHAT DO I NEED TO KNOW ABOUT THE DASH EATING PLAN? For the DASH eating plan, you will follow these general guidelines: Choose foods with a percent daily value for sodium of less than 5% (as listed on the food label). Use salt-free seasonings or herbs instead of table salt or sea salt. Check with your health care provider or pharmacist before using salt substitutes. Eat lower-sodium products, often labeled as "lower sodium" or "no salt added." Eat fresh foods. Eat more vegetables, fruits, and low-fat dairy products. Choose whole grains. Look for the word "whole" as the first word in the ingredient list. Choose fish and skinless chicken or [place] more often than red meat. Limit fish, poultry, and meat to 6 oz (170 g) each day. Limit sweets, desserts, sugars, and sugary drinks. Choose heart-healthy fats. Limit cheese to 1 oz (28 g) per day. Eat more home-cooked food and less restaurant, buffet, and fast food. Limit fried foods. Cook foods using methods other than frying. Limit canned vegetables. If you do use them, rinse them well to decrease the sodium. When eating at  a restaurant, ask that your food be prepared with less salt, or no salt if possible.  WHAT FOODS CAN I EAT? Seek help from a dietitian for individual calorie needs.  Grains Whole grain or whole wheat bread. Brown rice. Whole grain or whole wheat pasta. Quinoa, bulgur, and whole grain cereals. Low-sodium cereals. Corn or whole wheat flour tortillas. Whole grain cornbread. Whole grain crackers. Low-sodium crackers.  Vegetables Fresh or frozen vegetables (raw, steamed, roasted, or grilled). Low-sodium or reduced-sodium tomato and vegetable juices. Low-sodium or reduced-sodium tomato sauce and paste. Low-sodium or reduced-sodium canned vegetables.   Fruits All fresh, canned (in natural juice), or frozen fruits.  Meat and Other Protein Products Ground beef (85% or leaner), grass-fed beef, or beef trimmed of fat. Skinless chicken or [place]. Ground chicken or [place]. Pork trimmed of fat. All fish and seafood. Eggs. Dried beans, peas, or lentils. Unsalted nuts and seeds. Unsalted canned beans.  Dairy Low-fat dairy products, such as skim or 1% milk, 2% or reduced-fat cheeses, low-fat ricotta or cottage cheese, or plain low-fat yogurt. Low-sodium or reduced-sodium cheeses.  Fats and Oils Tub margarines without trans fats. Light or reduced-fat mayonnaise and salad dressings (reduced sodium). Avocado. Safflower, olive, or canola oils. Natural peanut or almond butter.  Other Unsalted popcorn and pretzels. The items listed above may not be a complete list of recommended foods or beverages. Contact your dietitian for more options.  WHAT FOODS ARE NOT RECOMMENDED?  Grains White bread. White pasta. White rice. Refined cornbread. Bagels and croissants. Crackers that contain trans fat.  Vegetables Creamed or fried vegetables. Vegetables in a cheese sauce. Regular canned vegetables. Regular canned tomato sauce and paste. Regular tomato and vegetable juices.  Fruits Dried fruits. Canned fruit in light  or heavy syrup. Fruit juice.  Meat and Other Protein Products Fatty cuts of meat. Ribs, chicken wings, bacon, sausage, bologna, salami, chitterlings, fatback, hot dogs, bratwurst, and packaged luncheon meats. Salted nuts and seeds. Canned beans with salt.  Dairy Whole or 2% milk, cream, half-and-half, and cream cheese. Whole-fat or sweetened yogurt. Full-fat cheeses or blue cheese. Nondairy creamers and whipped toppings. Processed cheese, cheese spreads, or cheese curds.  Condiments Onion and garlic salt, seasoned salt, table salt, and sea salt. Canned and packaged gravies. Worcestershire sauce. Tartar sauce. Barbecue sauce. Teriyaki sauce. Soy sauce, including reduced sodium. Steak sauce. Fish sauce. Oyster sauce. Cocktail sauce. Horseradish. Ketchup and mustard. Meat flavorings and tenderizers. Bouillon cubes. Hot sauce. Tabasco sauce. Marinades. Taco seasonings. Relishes.  Fats and Oils Butter, stick margarine, lard, shortening, ghee, and bacon fat. Coconut, palm kernel, or palm oils. Regular salad dressings.  Other Pickles and olives. Salted popcorn and pretzels.  The items listed above may not be a complete list of foods and beverages to avoid. Contact your dietitian for more information.  WHERE CAN I FIND MORE INFORMATION? National [employer], [employer], and [employer] Institute: travelstabloid.com Document Released: 02/14/2011 Document Revised: 07/12/2013 Document Reviewed: 12/30/2012 [facility] Patient Information 2015 [place], [place]. This information is not intended to replace advice given to you by your health care provider. Make sure you discuss any questions you have with your health care provider.   I think that you would greatly benefit from seeing a nutritionist.  If you are interested, please call Dr. Jenne Campus at [phone] to schedule an appointment.

## 2021-09-14 NOTE — Telephone Encounter (Signed)
Patient aware.

## 2021-09-14 NOTE — Progress Notes (Signed)
Office Visit Note  Patient: Charles [name]             Date of Birth: 1990/08/16           MRN: [id number]             PCP: Chevis Pretty, FNP Referring: Chevis Pretty, * Visit Date: 09/26/2021 Occupation: @GUAROCC @  Subjective:  Medication management  History of Present Illness: Charles [name] is a 31 y.o. male with history of gouty arthropathy.  He was started allopurinol on June 04, 2021.  He has been taking allopurinol 300 mg p.o. daily since then and he takes colchicine 0.6 mg p.o. daily.  He has not had any gout flares since he started allopurinol.  He has been tolerating the medication well.  He states he is not taking any NSAIDs since he started allopurinol.  He denies any recent intake of antibiotics.  He went to [place] about 3 weeks ago.  He states 2 weeks ago he developed diarrhea when he came back from there.  He was having 2-3 loose stools daily without any blood or mucus.  He had labs done by his PCP and was found to have elevated LFTs.  He will have repeat labs next week.  While on vacation he was drinking 2-3 drinks daily.  He stopped drinking alcohol after the lab results were back.  He reports some stiffness in his right ankle joint but no discomfort.  Patient states that he had an evaluation by Dr. Tamala Julian.  He agreed that patient has hypermobility.  Activities of Daily Living:  Patient reports morning stiffness for 5 minutes.   Patient Denies nocturnal pain.  Difficulty dressing/grooming: Denies Difficulty climbing stairs: Denies Difficulty getting out of chair: Denies Difficulty using hands for taps, buttons, cutlery, and/or writing: Denies  Review of Systems  Constitutional:  Positive for fatigue.  HENT:  Negative for mouth sores and mouth dryness.   Eyes:  Negative for dryness.  Respiratory:  Negative for shortness of breath.   Cardiovascular:  Negative for chest pain and palpitations.  Gastrointestinal:  Negative for blood in stool, constipation  and diarrhea.  Endocrine: Negative for increased urination.  Genitourinary:  Negative for involuntary urination.  Musculoskeletal:  Positive for morning stiffness. Negative for joint pain, gait problem, joint pain, joint swelling, myalgias, muscle weakness, muscle tenderness and myalgias.  Skin:  Negative for color change, rash, hair loss and sensitivity to sunlight.  Allergic/Immunologic: Negative for susceptible to infections.  Neurological:  Negative for dizziness and headaches.  Hematological:  Negative for swollen glands.  Psychiatric/Behavioral:  Positive for sleep disturbance. Negative for depressed mood. The patient is not nervous/anxious.     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 04/17/2012    Past Medical History:  Diagnosis Date   ADD (attention deficit disorder) f   Allergic rhinitis    Gout    Gynecomastia, male     Family History  Problem Relation Age of Onset   Depression Mother    Heart Problems Father    Hypertension Father    Healthy Sister    Cancer Maternal Aunt        breast  Heart attack Maternal Grandfather    Diabetes Paternal Grandmother    Diabetes Paternal Grandfather    Hyperlipidemia Paternal Grandfather    Hypertension Paternal Grandfather    Heart disease Paternal Grandfather    Past Surgical History:  Procedure Laterality Date   KNEE ARTHROSCOPY W/ MENISCECTOMY  11/23/2005   left knee - extensive bucket handle tear of medial meniscus   SHOULDER ARTHROSCOPY W/ BANKART PROCEDURE Right 08/10/2006   TONSILLECTOMY  09/16/1995   WISDOM TOOTH EXTRACTION     Social History    Social History Narrative   Not on file   Immunization History  Administered Date(s) Administered   DTaP 02/21/1989, 05/02/1989, 07/11/1989, 06/30/1990, 07/25/1993   Hepatitis A 06/17/2005, 12/27/2005   Hepatitis B 07/28/1992, 09/21/1992, 02/07/1993   HiB (PRP-OMP) 02/21/1989, 05/02/1989, 07/11/1989, 06/30/1990   IPV 02/21/1989, 05/02/1989, 06/30/1990, 07/25/1993   Influenza,Quad,Nasal, Live 12/31/2012, 02/07/2014, 01/22/2018   Influenza,inj,Quad PF,6+ Mos 12/23/2014, 01/28/2017, 01/25/2019   MMR 04/09/1990, 07/25/1993   Meningococcal Conjugate 12/27/2005   Tdap 12/22/2006     Objective: Vital Signs: BP 133/86 (BP Location: Left Arm, Patient Position: Sitting, Cuff Size: Normal)   Pulse 75   Ht 5' 8"  (1.727 m)   Wt 197 lb 3.2 oz (89.4 kg)   BMI 29.98 kg/m    Physical Exam Vitals and nursing note reviewed.  Constitutional:      Appearance: He is well-developed.  HENT:     Head: Normocephalic and atraumatic.  Eyes:     Conjunctiva/sclera: Conjunctivae normal.     Pupils: Pupils are equal, round, and reactive to light.  Cardiovascular:     Rate and Rhythm: Normal rate and regular rhythm.     Heart sounds: Normal heart sounds.  Pulmonary:     Effort: Pulmonary effort is normal.     Breath sounds: Normal breath sounds.  Abdominal:     General: Bowel sounds are normal.     Palpations: Abdomen is soft.  Musculoskeletal:     Cervical back: Normal range of motion and neck supple.  Skin:    General: Skin is warm and dry.     Capillary Refill: Capillary refill takes less than 2 seconds.  Neurological:     Mental Status: He is alert and oriented to person, place, and time.  Psychiatric:        Behavior: Behavior normal.      Musculoskeletal Exam: Cervical spine, thoracic and lumbar spine were in good range of motion.  Shoulder joints, elbow joints, wrist joints, MCPs PIPs and DIPs with good range of motion with no synovitis.  Hip joints, knee joints, ankles were in good  range of motion.  He had mild tenderness over the medial aspect of his right ankle joint.  There was no tenderness or MTPs.  CDAI Exam: CDAI Score: -- Patient Global: --; Provider Global: -- Swollen: 0 ; Tender: 0  Joint Exam 09/26/2021   No joint exam has been documented for this visit   There is currently no information documented on the homunculus. Go to the Rheumatology activity and complete the homunculus joint exam.  Investigation: No additional findings.  Imaging: No results found.  Recent Labs: Lab Results  Component Value Date   WBC 8.9 09/14/2021   HGB 17.5 09/14/2021   PLT 316 09/14/2021   NA 138 09/14/2021   K 4.3 09/14/2021   CL 101 09/14/2021   CO2 22 09/14/2021   GLUCOSE 93 09/14/2021   BUN 8 09/14/2021   CREATININE 1.03 09/14/2021   BILITOT  0.8 09/14/2021   ALKPHOS 83 09/14/2021   AST 80 (H) 09/14/2021   ALT 154 (H) 09/14/2021   PROT 6.8 09/14/2021   ALBUMIN 4.8 09/14/2021   CALCIUM 10.0 09/14/2021    Speciality Comments: No specialty comments available.  Procedures:  No procedures performed Allergies: Indomethacin and Lamictal [lamotrigine]   Assessment / Plan:     Visit Diagnoses: Idiopathic chronic gout of right ankle without tophus -patient was started on allopurinol in March after he was diagnosed with hyperuricemic gout.  His uric acid level was 9.8.  He was started on allopurinol.  His labs in April were stable with no elevation in LFTs.  His uric acid was 6.3.  He has not had a gout flares since his last visit.  He has been taking allopurinol 300 mg daily and colchicine 0.6 mg 1 tablet daily without any side effects.  He will be getting repeat labs with his PCP in the next couple of weeks.  I advised him to get repeat uric acid as well.  Raynaud's disease without gangrene-no nailbed capillary changes or sclerodactyly was noted.  All autoimmune work-up had been negative.  He gives history of intermittent symptoms.  Hypermobility of joint -is  followed by Dr. Tamala Julian.  Elevated LFTs-Labs on September 14, 2021 AST 80 and ALT 154.  Patient states that he went to [place] and developed diarrhea when he came back.  He was having 2-3 loose stools daily without any blood or mucus.  He had labs which showed elevated LFTs.  He will have repeat labs by his PCP.  Alcohol use-he states that he was drinking alcohol on a regular basis.  He had some extra drinks while he was in [place].  He has not been drinking alcohol since August 15, 2021.  History of diarrhea-he gives history of diarrhea lasting for about a week starting 2 weeks ago.  There is no history of blood or mucus in the stool.  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  No orders of the defined types were placed in this encounter.    Follow-Up Instructions: Return in about 3 months (around 12/27/2021) for Gout.   Bo Merino, MD  Note - This record has been created using Editor, commissioning.  Chart creation errors have been sought, but may not always  have been located. Such creation errors do not reflect on  the standard of medical care.

## 2021-09-14 NOTE — Progress Notes (Signed)
Subjective:  Patient ID: Charles [name], male    DOB: 06-27-1990, 31 y.o.   MRN: [id number]  Patient Care Team: Chevis Pretty, [place] as PCP - General (Nurse Practitioner)   Chief Complaint:  Diarrhea (Lethargy/GERD/Recent business trip to [place], [place])   HPI: Charles [name] is a 31 y.o. male presenting on 09/14/2021 for Diarrhea (Lethargy/GERD/Recent business trip to [place], [place])   Pt presents today for evaluation of diarrhea, malaise and fatigue, and epigastric pain after a recent business trip to [place].  He is also noted to be hypertensive in office. Upon chart review, he has had several office visits with elevated readings. Not on medications.   Diarrhea  This is a new problem. The current episode started in the past 7 days. The problem occurs 2 to 4 times per day. The problem has been unchanged. The patient states that diarrhea does not awaken him from sleep. Associated symptoms include abdominal pain. Pertinent negatives include no arthralgias, bloating, chills, coughing, fever, headaches, increased  flatus, myalgias, sweats, URI, vomiting or weight loss. Risk factors include travel to endemic area. He has tried increased fluids for the symptoms. The treatment provided no relief.    Relevant past medical, surgical, family, and social history reviewed and updated as indicated.  Allergies and medications reviewed and updated. Data reviewed: Chart in Epic.   Past Medical History:  Diagnosis Date   ADD (attention deficit disorder) f   Allergic rhinitis    Gout    Gynecomastia, male     Past Surgical History:  Procedure Laterality Date   KNEE ARTHROSCOPY W/ MENISCECTOMY  11/23/2005   left knee - extensive bucket handle tear of medial meniscus   SHOULDER ARTHROSCOPY W/ BANKART PROCEDURE Right 08/10/2006   TONSILLECTOMY  09/16/1995   WISDOM TOOTH EXTRACTION      Social History   Socioeconomic History   Marital status: Single    Spouse name: Not on file    Number of children: Not on file   Years of education: Not on file   Highest education level: Not on file  Occupational History   Not on file  Tobacco Use   Smoking status: Never    Passive exposure: Never   Smokeless tobacco: Current    Types: Chew  Vaping Use   Vaping Use: Never used  Substance and Sexual Activity   Alcohol use: Yes    Alcohol/week: 6.0 standard drinks of alcohol    Types: 6 Cans of beer per week   Drug use: No   Sexual activity: Not on file  Other Topics Concern   Not on file  Social History Narrative   Not on file   Social Determinants of Health   Financial Resource Strain: Not on file  Food Insecurity: Not on file  Transportation Needs: Not on file  Physical Activity: Not on file  Stress: Not on file  Social Connections: Not on file  Intimate Partner Violence: Not on file    Outpatient Encounter Medications as of 09/14/2021  Medication Sig   allopurinol (ZYLOPRIM) 300 MG tablet Take 1 tablet (300 mg total) by mouth daily.   [START ON 10/18/2021] amphetamine-dextroamphetamine (ADDERALL XR) 30 MG 24 hr capsule Take 1 capsule (30 mg total) by mouth every morning.   [START ON 09/18/2021] amphetamine-dextroamphetamine (ADDERALL XR) 30 MG 24 hr capsule Take 1 capsule (30 mg total) by mouth every morning.   amphetamine-dextroamphetamine (ADDERALL XR) 30 MG 24 hr capsule Take 1 capsule (30 mg total) by  mouth every morning.   ARIPiprazole (ABILIFY) 10 MG tablet Take 1 tablet (10 mg total) by mouth daily.   azithromycin (ZITHROMAX) 1 g powder Take 1 packet (1 g total) by mouth once for 1 dose.   citalopram (CELEXA) 40 MG tablet Take 1 tablet (40 mg total) by mouth daily.   colchicine 0.6 MG tablet Take 0.12m po qd, increase to BID for flares.   diphenhydrAMINE (BENADRYL) 25 MG tablet Take 1 tablet (25 mg total) by mouth every 6 (six) hours as needed for itching.   EPINEPHrine 0.3 mg/0.3 mL IJ SOAJ injection Inject 0.3 mLs (0.3 mg total) into the muscle once as  needed for up to 1 dose (Tongue or throat swelling).   hydrochlorothiazide (HYDRODIURIL) 25 MG tablet Take 1 tablet (25 mg total) by mouth daily.   testosterone cypionate (DEPOTESTOSTERONE CYPIONATE) 200 MG/ML injection Inject 0.5 mLs (100 mg total) into the muscle every 14 (fourteen) days.   valACYclovir (VALTREX) 1000 MG tablet Take 1 tablet (1,000 mg total) by mouth 2 (two) times daily as needed. Prn as needed for fever blisters   No facility-administered encounter medications on file as of 09/14/2021.    Allergies  Allergen Reactions   Indomethacin     Brain fog, balance issues, cognitive dissonance.    Lamictal [Lamotrigine] Other (See Comments)    "Muscle Spasms in back"    Review of Systems  Constitutional:  Positive for activity change, appetite change and fatigue. Negative for chills, diaphoresis, fever, unexpected weight change and weight loss.  Eyes:  Negative for photophobia and visual disturbance.  Respiratory:  Negative for cough and shortness of breath.   Cardiovascular:  Negative for chest pain, palpitations and leg swelling.  Gastrointestinal:  Positive for abdominal pain and diarrhea. Negative for abdominal distention, anal bleeding, bloating, blood in stool, constipation, flatus, nausea, rectal pain and vomiting.  Genitourinary:  Negative for decreased urine volume and difficulty urinating.  Musculoskeletal:  Negative for arthralgias and myalgias.  Neurological:  Negative for dizziness, tremors, seizures, syncope, facial asymmetry, speech difficulty, weakness, light-headedness, numbness and headaches.  Psychiatric/Behavioral:  Negative for confusion.   All other systems reviewed and are negative.       Objective:  BP (!) 157/98   Pulse 97   Temp 98.2 F (36.8 C)   Ht 5' 8"  (1.727 m)   Wt 193 lb (87.5 kg)   SpO2 95%   BMI 29.35 kg/m    Wt Readings from Last 3 Encounters:  09/14/21 193 lb (87.5 kg)  08/02/21 191 lb (86.6 kg)  07/30/21 192 lb (87.1 kg)     Physical Exam Vitals and nursing note reviewed.  Constitutional:      General: He is not in acute distress.    Appearance: Normal appearance. He is well-developed, well-groomed and overweight. He is not ill-appearing, toxic-appearing or diaphoretic.  HENT:     Head: Normocephalic and atraumatic.     Jaw: There is normal jaw occlusion.     Right Ear: Hearing normal.     Left Ear: Hearing normal.     Nose: Nose normal.     Mouth/Throat:     Lips: Pink.     Mouth: Mucous membranes are moist.     Pharynx: Uvula midline.  Eyes:     General: Lids are normal.     Pupils: Pupils are equal, round, and reactive to light.  Neck:     Thyroid: No thyroid mass, thyromegaly or thyroid tenderness.     Vascular: No  carotid bruit or JVD.     Trachea: Trachea and phonation normal.  Cardiovascular:     Rate and Rhythm: Normal rate and regular rhythm.     Chest Wall: PMI is not displaced.     Pulses: Normal pulses.     Heart sounds: Normal heart sounds. No murmur heard.    No friction rub. No gallop.  Pulmonary:     Effort: Pulmonary effort is normal. No respiratory distress.     Breath sounds: Normal breath sounds.  Abdominal:     General: Bowel sounds are normal. There is no distension or abdominal bruit.     Palpations: Abdomen is soft. There is no hepatomegaly or splenomegaly.     Tenderness: There is no abdominal tenderness.  Musculoskeletal:        General: Normal range of motion.     Cervical back: Normal range of motion and neck supple.     Right lower leg: No edema.     Left lower leg: No edema.  Lymphadenopathy:     Cervical: No cervical adenopathy.  Skin:    General: Skin is warm and dry.     Capillary Refill: Capillary refill takes less than 2 seconds.     Coloration: Skin is not cyanotic, jaundiced or pale.     Findings: No rash.  Neurological:     General: No focal deficit present.     Mental Status: He is alert and oriented to person, place, and time.     Sensory:  Sensation is intact.     Motor: Motor function is intact.     Coordination: Coordination is intact.     Gait: Gait is intact.     Deep Tendon Reflexes: Reflexes are normal and symmetric.  Psychiatric:        Attention and Perception: Attention and perception normal.        Mood and Affect: Mood and affect normal.        Speech: Speech normal.        Behavior: Behavior normal. Behavior is cooperative.        Thought Content: Thought content normal.        Cognition and Memory: Cognition and memory normal.        Judgment: Judgment normal.     Results for orders placed or performed in visit on 06/27/21  CBC with Differential/Platelet  Result Value Ref Range   WBC 5.8 3.8 - 10.8 Thousand/uL   RBC 5.60 4.20 - 5.80 Million/uL   Hemoglobin 17.1 13.2 - 17.1 g/dL   HCT 50.4 (H) 38.5 - 50.0 %   MCV 90.0 80.0 - 100.0 fL   MCH 30.5 27.0 - 33.0 pg   MCHC 33.9 32.0 - 36.0 g/dL   RDW 12.8 11.0 - 15.0 %   Platelets 325 140 - 400 Thousand/uL   MPV 10.2 7.5 - 12.5 fL   Neutro Abs 3,515 1,500 - 7,800 cells/uL   Lymphs Abs 1,670 850 - 3,900 cells/uL   Absolute Monocytes 447 200 - 950 cells/uL   Eosinophils Absolute 139 15 - 500 cells/uL   Basophils Absolute 29 0 - 200 cells/uL   Neutrophils Relative % 60.6 %   Total Lymphocyte 28.8 %   Monocytes Relative 7.7 %   Eosinophils Relative 2.4 %   Basophils Relative 0.5 %  COMPLETE METABOLIC PANEL WITH GFR  Result Value Ref Range   Glucose, Bld 80 65 - 99 mg/dL   BUN 13 7 - 25 mg/dL   Creat  0.97 0.60 - 1.26 mg/dL   eGFR 106 > OR = 60 mL/min/1.28m   BUN/Creatinine Ratio NOT APPLICABLE 6 - 22 (calc)   Sodium 142 135 - 146 mmol/L   Potassium 4.1 3.5 - 5.3 mmol/L   Chloride 102 98 - 110 mmol/L   CO2 27 20 - 32 mmol/L   Calcium 9.8 8.6 - 10.3 mg/dL   Total Protein 6.6 6.1 - 8.1 g/dL   Albumin 4.6 3.6 - 5.1 g/dL   Globulin 2.0 1.9 - 3.7 g/dL (calc)   AG Ratio 2.3 1.0 - 2.5 (calc)   Total Bilirubin 0.5 0.2 - 1.2 mg/dL   Alkaline phosphatase  (APISO) 72 36 - 130 U/L   AST 21 10 - 40 U/L   ALT 39 9 - 46 U/L  Uric acid  Result Value Ref Range   Uric Acid, Serum 6.3 4.0 - 8.0 mg/dL       Pertinent labs & imaging results that were available during my care of the patient were reviewed by me and considered in my medical decision making.  Assessment & Plan:  JShriyanwas seen today for diarrhea.  Diagnoses and all orders for this visit:  Traveler's diarrhea Epigastric pain Malaise and fatigue Will treat with one time dose of zithromax. Labs pending. Symptomatic care discussed in detail. Report any new, worsening, or persistent symptoms.  -     CBC with Differential/Platelet -     CMP14+EGFR -     H Pylori, IGM, IGG, IGA AB -     azithromycin (ZITHROMAX) 1 g powder; Take 1 packet (1 g total) by mouth once for 1 dose. -     CMP14+EGFR -     Thyroid Panel With TSH  Primary hypertension Several office visits with documented elevated blood pressure readings. He is on Adderall and testosterone which could be contributory. Will check below labs and initiate HCTZ. Pt to monitor BP readings and record. Bring log to next visit. DASH diet and exercise encouraged. Follow up in 2 weeks for repeat BMP and BP. -     CBC with Differential/Platelet -     CMP14+EGFR -     Thyroid Panel With TSH -     hydrochlorothiazide (HYDRODIURIL) 25 MG tablet; Take 1 tablet (25 mg total) by mouth daily.     Continue all other maintenance medications.  Follow up plan: Return in about 2 weeks (around 09/28/2021), or if symptoms worsen or fail to improve, for BP and BMP.   Continue healthy lifestyle choices, including diet (rich in fruits, vegetables, and lean proteins, and low in salt and simple carbohydrates) and exercise (at least 30 minutes of moderate physical activity daily).  Educational handout given for DASH eating plan  The above assessment and management plan was discussed with the patient. The patient verbalized understanding of and has  agreed to the management plan. Patient is aware to call the clinic if they develop any new symptoms or if symptoms persist or worsen. Patient is aware when to return to the clinic for a follow-up visit. Patient educated on when it is appropriate to go to the emergency department.   MMonia Pouch FNP-C [facility] [phone]

## 2021-09-25 NOTE — Progress Notes (Unsigned)
[place] Phone: [phone] Subjective:   Charles [name], am serving as a scribe for Dr. Hulan Saas.  I'm seeing this patient by the request  of:  Chevis Pretty, FNP  CC: Back and neck pain follow-up  [id number]  Charles [name] is a 31 y.o. male coming in with complaint of back and neck pain. OMT on 07/30/2021. Hypermobility as well. Patient states that he has been doing well.  Patient was having some difficulty.  Unfortunately was found to have some elevated liver enzymes.  Is having it rechecked on Friday of this week.  Patient denies any abdominal pain.  Did do some traveling, does drink relatively regularly.  Medications patient has been prescribed: None  Taking:         Reviewed prior external information including notes and imaging from previsou exam, outside providers and external EMR if available.   As well as notes that were available from care everywhere and other healthcare systems.  Past medical history, social, surgical and family history all reviewed in electronic medical record.  [name] pertanent information unless stated regarding to the chief complaint.   Past Medical History:  Diagnosis Date   ADD (attention deficit disorder) f   Allergic rhinitis    Gout    Gynecomastia, male     Allergies  Allergen Reactions   Indomethacin     Brain fog, balance issues, cognitive dissonance.    Lamictal [Lamotrigine] Other (See Comments)    "Muscle Spasms in back"     Review of Systems:  [name] headache, visual changes, nausea, vomiting, diarrhea, constipation, dizziness, abdominal pain, skin rash, fevers, chills, night sweats, weight loss, swollen lymph nodes, body aches, joint swelling, chest pain, shortness of breath, mood changes. POSITIVE muscle aches  Objective  Blood pressure 118/74, pulse 70, height '5\' 8"'$  (1.727 m), weight 197 lb (89.4 kg), SpO2 98 %.   General: [name] apparent  distress alert and oriented x3 mood and affect normal, dressed appropriately.  HEENT: Pupils equal, extraocular movements intact  Respiratory: Patient's speak in full sentences and does not appear short of breath  Cardiovascular: [name] lower extremity edema, non tender, [name] erythema  Gait MSK:  Back  Hypermobility noted patient continues to make tightness noted though in the thoracolumbar juncture as well as over the sacral area.  Osteopathic findings  C6 flexed rotated and side bent left T6 extended rotated and side bent right inhaled rib L5 flexed rotated and side bent left  Sacrum right on right       Assessment and Plan:  Hypermobility syndrome Chronic problem may be exacerbated with some other comorbidities at the moment.  Patient has not traveled recently quite a bit.  Discussed posture and ergonomics.  Follow-up with me again in 6 to 8 weeks    Nonallopathic problems  Decision today to treat with OMT was based on Physical Exam  After verbal consent patient was treated with HVLA, ME, FPR techniques in cervical, rib, thoracic, lumbar, and sacral  areas  Patient tolerated the procedure well with improvement in symptoms  Patient given exercises, stretches and lifestyle modifications  See medications in patient instructions if given  Patient will follow up in 4-8 weeks    The above documentation has been reviewed and is accurate and complete Charles Pulley, DO          Note: This dictation was prepared with Dragon dictation along with smaller phrase technology. Any transcriptional  errors that result from this process are unintentional.

## 2021-09-26 ENCOUNTER — Ambulatory Visit: Payer: 59 | Admitting: Family Medicine

## 2021-09-26 ENCOUNTER — Encounter: Payer: Self-pay | Admitting: Family Medicine

## 2021-09-26 ENCOUNTER — Ambulatory Visit: Payer: 59 | Admitting: Rheumatology

## 2021-09-26 ENCOUNTER — Encounter: Payer: Self-pay | Admitting: Rheumatology

## 2021-09-26 VITALS — BP 118/74 | HR 70 | Ht 68.0 in | Wt 197.0 lb

## 2021-09-26 VITALS — BP 133/86 | HR 75 | Ht 68.0 in | Wt 197.2 lb

## 2021-09-26 DIAGNOSIS — M357 Hypermobility syndrome: Secondary | ICD-10-CM | POA: Diagnosis not present

## 2021-09-26 DIAGNOSIS — R7989 Other specified abnormal findings of blood chemistry: Secondary | ICD-10-CM

## 2021-09-26 DIAGNOSIS — E291 Testicular hypofunction: Secondary | ICD-10-CM

## 2021-09-26 DIAGNOSIS — M1A0710 Idiopathic chronic gout, right ankle and foot, without tophus (tophi): Secondary | ICD-10-CM | POA: Diagnosis not present

## 2021-09-26 DIAGNOSIS — D582 Other hemoglobinopathies: Secondary | ICD-10-CM

## 2021-09-26 DIAGNOSIS — M9903 Segmental and somatic dysfunction of lumbar region: Secondary | ICD-10-CM

## 2021-09-26 DIAGNOSIS — M9908 Segmental and somatic dysfunction of rib cage: Secondary | ICD-10-CM

## 2021-09-26 DIAGNOSIS — M9904 Segmental and somatic dysfunction of sacral region: Secondary | ICD-10-CM | POA: Diagnosis not present

## 2021-09-26 DIAGNOSIS — F3341 Major depressive disorder, recurrent, in partial remission: Secondary | ICD-10-CM

## 2021-09-26 DIAGNOSIS — F902 Attention-deficit hyperactivity disorder, combined type: Secondary | ICD-10-CM

## 2021-09-26 DIAGNOSIS — M9901 Segmental and somatic dysfunction of cervical region: Secondary | ICD-10-CM | POA: Diagnosis not present

## 2021-09-26 DIAGNOSIS — M9902 Segmental and somatic dysfunction of thoracic region: Secondary | ICD-10-CM

## 2021-09-26 DIAGNOSIS — M249 Joint derangement, unspecified: Secondary | ICD-10-CM | POA: Diagnosis not present

## 2021-09-26 DIAGNOSIS — N62 Hypertrophy of breast: Secondary | ICD-10-CM

## 2021-09-26 DIAGNOSIS — Z87898 Personal history of other specified conditions: Secondary | ICD-10-CM

## 2021-09-26 DIAGNOSIS — I7300 Raynaud's syndrome without gangrene: Secondary | ICD-10-CM | POA: Diagnosis not present

## 2021-09-26 DIAGNOSIS — Z720 Tobacco use: Secondary | ICD-10-CM

## 2021-09-26 DIAGNOSIS — Z8659 Personal history of other mental and behavioral disorders: Secondary | ICD-10-CM

## 2021-09-26 DIAGNOSIS — Z789 Other specified health status: Secondary | ICD-10-CM

## 2021-09-26 NOTE — Patient Instructions (Signed)
Get uric acid level with your next labs.

## 2021-09-26 NOTE — Assessment & Plan Note (Signed)
Chronic problem may be exacerbated with some other comorbidities at the moment.  Patient has not traveled recently quite a bit.  Discussed posture and ergonomics.  Follow-up with me again in 6 to 8 weeks

## 2021-09-28 ENCOUNTER — Encounter: Payer: Self-pay | Admitting: Nurse Practitioner

## 2021-09-28 ENCOUNTER — Ambulatory Visit: Payer: 59 | Admitting: Nurse Practitioner

## 2021-09-28 VITALS — BP 132/87 | HR 104 | Temp 97.4°F | Resp 20 | Ht 68.0 in | Wt 194.0 lb

## 2021-09-28 DIAGNOSIS — M1A0390 Idiopathic chronic gout, unspecified wrist, without tophus (tophi): Secondary | ICD-10-CM | POA: Diagnosis not present

## 2021-09-28 DIAGNOSIS — R7989 Other specified abnormal findings of blood chemistry: Secondary | ICD-10-CM | POA: Diagnosis not present

## 2021-09-28 NOTE — Progress Notes (Signed)
   Subjective:    Patient ID: Charles [name], male    DOB: 1990/07/07, 31 y.o.   MRN: [id number]   Chief Complaint: recheck diarrhea  HPI  Patient went to [place] for work and developed travelers diarrhea while in [place] for work. He is here for recheck. His diarrhea has resolved. His liver enzymes were elevated last week.  Patient wants labs rechecked today. He has a history of  gout.   Review of Systems  Constitutional:  Negative for diaphoresis.  Eyes:  Negative for pain.  Respiratory:  Negative for shortness of breath.   Cardiovascular:  Negative for chest pain, palpitations and leg swelling.  Gastrointestinal:  Negative for abdominal pain.  Endocrine: Negative for polydipsia.  Skin:  Negative for rash.  Neurological:  Negative for dizziness, weakness and headaches.  Hematological:  Does not bruise/bleed easily.  All other systems reviewed and are negative.      Objective:   Physical Exam Vitals and nursing note reviewed.  Constitutional:      Appearance: Normal appearance. He is well-developed.  Neck:     Thyroid: No thyroid mass or thyromegaly.     Vascular: No carotid bruit or JVD.     Trachea: Phonation normal.  Cardiovascular:     Rate and Rhythm: Normal rate and regular rhythm.  Pulmonary:     Effort: Pulmonary effort is normal. No respiratory distress.     Breath sounds: Normal breath sounds.  Abdominal:     General: Bowel sounds are normal.     Palpations: Abdomen is soft.     Tenderness: There is no abdominal tenderness.  Musculoskeletal:        General: Normal range of motion.     Cervical back: Normal range of motion and neck supple.  Lymphadenopathy:     Cervical: No cervical adenopathy.  Skin:    General: Skin is warm and dry.  Neurological:     Mental Status: He is alert and oriented to person, place, and time.  Psychiatric:        Behavior: Behavior normal.        Thought Content: Thought content normal.        Judgment: Judgment normal.      BP 132/87   Pulse (!) 104   Temp (!) 97.4 F (36.3 C) (Temporal)   Resp 20   Ht _0  (1.727 m)   Wt 194 lb (88 kg)   SpO2 95%   BMI 29.50 kg/m        Assessment & Plan:  [name] in today with chief complaint of No chief complaint on file.   1. 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 Elevated LFTs Repeat labs pending - Folate - CMP14+EGFR    The above assessment and management plan was discussed with the patient. The patient verbalized understanding of and has agreed to the management plan. Patient is aware to call the clinic if symptoms persist or worsen. Patient is aware when to return to the clinic for a follow-up visit. Patient educated on when it is appropriate to go to the emergency department.   Mary-Margaret Hassell Done, FNP

## 2021-09-28 NOTE — Patient Instructions (Signed)
Gout  Gout is painful swelling of your joints. Gout is a type of arthritis. It is caused by having too much uric acid in your body. Uric acid is a chemical that is made when your body breaks down substances called purines. If your body has too much uric acid, sharp crystals can form and build up in your joints. This causes pain and swelling. Gout attacks can happen quickly and be very painful (acute gout). Over time, the attacks can affect more joints and happen more often (chronic gout). What are the causes? Gout is caused by too much uric acid in your blood. This can happen because: Your kidneys do not remove enough uric acid from your blood. Your body makes too much uric acid. You eat too many foods that are high in purines. These foods include organ meats, some seafood, and beer. Trauma or stress can bring on an attack. What increases the risk? Having a family history of gout. Being male and middle-aged. Being male and having gone through menopause. Having an organ transplant. Taking certain medicines. Having certain conditions, such as: Being very overweight (obese). Lead poisoning. Kidney disease. A skin condition called psoriasis. Other risks include: Losing weight too quickly. Not having enough water in the body (being dehydrated). Drinking alcohol, especially beer. Drinking beverages that are sweetened with a type of sugar called fructose. What are the signs or symptoms? An attack of acute gout often starts at night and usually happens in just one joint. The most common place is the big toe. Other joints that may be affected include joints of the feet, ankle, knee, fingers, wrist, or elbow. Symptoms may include: Very bad pain. Warmth. Swelling. Stiffness. Tenderness. The affected joint may be very painful to touch. Shiny, red, or purple skin. Chills and fever. Chronic gout may cause symptoms more often. More joints may be involved. You may also have white or yellow lumps  (tophi) on your hands or feet or in other areas near your joints. How is this treated? Treatment for an acute attack may include medicines for pain and swelling, such as: NSAIDs, such as ibuprofen. Steroids taken by mouth or injected into a joint. Colchicine. This can be given by mouth or through an IV tube. Treatment to prevent future attacks may include: Taking small doses of NSAIDs or colchicine daily. Using a medicine that reduces uric acid levels in your blood, such as allopurinol. Making changes to your diet. You may need to see a food expert (dietitian) about what to eat and drink to prevent gout. Follow these instructions at home: During a gout attack  If told, put ice on the painful area. To do this: Put ice in a plastic bag. Place a towel between your skin and the bag. Leave the ice on for 20 minutes, 2-3 times a day. Take off the ice if your skin turns bright red. This is very important. If you cannot feel pain, heat, or cold, you have a greater risk of damage to the area. Raise the painful joint above the level of your heart as often as you can. Rest the joint as much as possible. If the joint is in your leg, you may be given crutches. Follow instructions from your doctor about what you cannot eat or drink. Avoiding future gout attacks Eat a low-purine diet. Avoid foods and drinks such as: Liver. Kidney. Anchovies. Asparagus. Herring. Mushrooms. Mussels. Beer. Stay at a healthy weight. If you want to lose weight, talk with your doctor. Do not   lose weight too fast. Start or continue an exercise plan as told by your doctor. Eating and drinking Avoid drinks sweetened by fructose. Drink enough fluids to keep your pee (urine) pale yellow. If you drink alcohol: Limit how much you have to: 0-1 drink a day for women who are not pregnant. 0-2 drinks a day for men. Know how much alcohol is in a drink. In the [place] one drink equals one 12 oz bottle of beer (355 mL), one 5 oz  glass of wine (148 mL), or one 1 oz glass of hard liquor (44 mL). General instructions Take over-the-counter and prescription medicines only as told by your doctor. Ask your doctor if you should avoid driving or using machines while you are taking your medicine. Return to your normal activities when your doctor says that it is safe. Keep all follow-up visits. Where to find more information National Institutes of Health: www.niams.nih.gov Contact a doctor if: You have another gout attack. You still have symptoms of a gout attack after 10 days of treatment. You have problems (side effects) because of your medicines. You have chills or a fever. You have burning pain when you pee (urinate). You have pain in your lower back or belly. Get help right away if: You have very bad pain. Your pain cannot be controlled. You cannot pee. Summary Gout is painful swelling of the joints. The most common site of pain is the big toe, but it can affect other joints. Medicines and avoiding some foods can help to prevent and treat gout attacks. This information is not intended to replace advice given to you by your health care provider. Make sure you discuss any questions you have with your health care provider. Document Revised: 11/29/2020 Document Reviewed: 11/29/2020 Elsevier Patient Education  2023 Elsevier Inc.  

## 2021-09-29 LAB — FOLATE: Folate: >20 ng/mL (ref >3.0)

## 2021-09-29 LAB — CMP14+EGFR
ALT: 110 IU/L — ABNORMAL HIGH (ref 0–44)
AST: 46 IU/L — ABNORMAL HIGH (ref 0–40)
Albumin/Globulin Ratio: 2.3 — ABNORMAL HIGH (ref 1.2–2.2)
Albumin: 5 g/dL (ref 4.1–5.1)
Alkaline Phosphatase: 72 IU/L (ref 44–121)
BUN/Creatinine Ratio: 12 (ref 9–20)
BUN: 13 mg/dL (ref 6–20)
Bilirubin Total: 0.8 mg/dL (ref 0.0–1.2)
CO2: 23 mmol/L (ref 20–29)
Calcium: 10 mg/dL (ref 8.7–10.2)
Chloride: 98 mmol/L (ref 96–106)
Creatinine, Ser: 1.12 mg/dL (ref 0.76–1.27)
Globulin, Total: 2.2 g/dL (ref 1.5–4.5)
Glucose: 96 mg/dL (ref 70–99)
Potassium: 4.1 mmol/L (ref 3.5–5.2)
Sodium: 138 mmol/L (ref 134–144)
Total Protein: 7.2 g/dL (ref 6.0–8.5)
eGFR: 90 mL/min/{1.73_m2} (ref >59)

## 2021-09-29 LAB — URIC ACID: Uric Acid: 7.1 mg/dL (ref 3.8–8.4)

## 2021-10-06 LAB — CMP14+EGFR
ALT: 154 IU/L — ABNORMAL HIGH (ref 0–44)
AST: 80 IU/L — ABNORMAL HIGH (ref 0–40)
Albumin/Globulin Ratio: 2.4 — ABNORMAL HIGH (ref 1.2–2.2)
Albumin: 4.8 g/dL (ref 4.0–5.0)
Alkaline Phosphatase: 83 IU/L (ref 44–121)
BUN/Creatinine Ratio: 8 — ABNORMAL LOW (ref 9–20)
BUN: 8 mg/dL (ref 6–20)
Bilirubin Total: 0.8 mg/dL (ref 0.0–1.2)
CO2: 22 mmol/L (ref 20–29)
Calcium: 10 mg/dL (ref 8.7–10.2)
Chloride: 101 mmol/L (ref 96–106)
Creatinine, Ser: 1.03 mg/dL (ref 0.76–1.27)
Globulin, Total: 2 g/dL (ref 1.5–4.5)
Glucose: 93 mg/dL (ref 70–99)
Potassium: 4.3 mmol/L (ref 3.5–5.2)
Sodium: 138 mmol/L (ref 134–144)
Total Protein: 6.8 g/dL (ref 6.0–8.5)
eGFR: 99 mL/min/{1.73_m2} (ref >59)

## 2021-10-06 LAB — CBC WITH DIFFERENTIAL/PLATELET
Basophils Absolute: 0.1 10*3/uL (ref 0.0–0.2)
Basos: 1 %
EOS (ABSOLUTE): 0.1 10*3/uL (ref 0.0–0.4)
Eos: 1 %
Hematocrit: 50.3 % (ref 37.5–51.0)
Hemoglobin: 17.5 g/dL (ref 13.0–17.7)
Immature Grans (Abs): 0 10*3/uL (ref 0.0–0.1)
Immature Granulocytes: 0 %
Lymphocytes Absolute: 1.9 10*3/uL (ref 0.7–3.1)
Lymphs: 22 %
MCH: 30.6 pg (ref 26.6–33.0)
MCHC: 34.8 g/dL (ref 31.5–35.7)
MCV: 88 fL (ref 79–97)
Monocytes Absolute: 0.8 10*3/uL (ref 0.1–0.9)
Monocytes: 9 %
Neutrophils Absolute: 6 10*3/uL (ref 1.4–7.0)
Neutrophils: 67 %
Platelets: 316 10*3/uL (ref 150–450)
RBC: 5.71 x10E6/uL (ref 4.14–5.80)
RDW: 12.3 % (ref 11.6–15.4)
WBC: 8.9 10*3/uL (ref 3.4–10.8)

## 2021-10-06 LAB — THYROID PANEL WITH TSH
Free Thyroxine Index: 2.1 (ref 1.2–4.9)
T3 Uptake Ratio: 31 % (ref 24–39)
T4, Total: 6.7 ug/dL (ref 4.5–12.0)
TSH: 1.54 u[IU]/mL (ref 0.450–4.500)

## 2021-10-06 LAB — H PYLORI, IGM, IGG, IGA AB
H pylori, IgM Abs: <9 units (ref 0.0–8.9)
H. pylori, IgA Abs: <9 units (ref 0.0–8.9)
H. pylori, IgG AbS: 0.21 Index Value (ref 0.00–0.79)

## 2021-12-04 NOTE — Progress Notes (Unsigned)
[place] Phone: [phone] Subjective:   Charles [name], am serving as a scribe for Dr. Hulan Saas.  I'm seeing this patient by the request  of:  Chevis Pretty, FNP  CC: back and neck pain   [id number]  Charles [name] is a 31 y.o. male coming in with complaint of back and neck pain. OMT on 09/26/2021. Patient states that his back has been doing well since last visit.  Patient has had some mild tightness overall with stair training.      Medications patient has been prescribed: None  Taking:       Reviewed prior external information including notes and imaging from previsou exam, outside providers and external EMR if available.   As well as notes that were available from care everywhere and other healthcare systems. Seen for gout recently   Past medical history, social, surgical and family history all reviewed in electronic medical record.  [name] pertanent information unless stated regarding to the chief complaint.   Past Medical History:  Diagnosis Date   ADD (attention deficit disorder) f   Allergic rhinitis    Gout    Gynecomastia, male     Allergies  Allergen Reactions   Indomethacin     Brain fog, balance issues, cognitive dissonance.    Lamictal [Lamotrigine] Other (See Comments)    "Muscle Spasms in back"     Review of Systems:  [name] headache, visual changes, nausea, vomiting, diarrhea, constipation, dizziness, abdominal pain, skin rash, fevers, chills, night sweats, weight loss, swollen lymph nodes, body aches, joint swelling, chest pain, shortness of breath, mood changes. POSITIVE muscle aches  Objective  Blood pressure 110/78, pulse 66, height '5\' 8"'$  (1.727 m), weight 194 lb (88 kg), SpO2 92 %.   General: [name] apparent distress alert and oriented x3 mood and affect normal, dressed appropriately.  HEENT: Pupils equal, extraocular movements intact  Respiratory: Patient's  speak in full sentences and does not appear short of breath  Cardiovascular: [name] lower extremity edema, non tender, [name] erythema  Gait MSK:  Back does have some hypermobility noted.  Significant stiffness noted in the parascapular region bilaterally.  Neck exam does have the hypermobility of mild tightness with sidebending bilaterally.  Osteopathic findings  C3 flexed rotated and side bent right C7 flexed rotated and side bent left T4 extended rotated and side bent right inhaled rib T9 extended rotated and side bent left with inhaled rib L1 flexed rotated and side bent left Sacrum right on right     Assessment and Plan:  Hypermobility syndrome Still responding extremely well to osteopathic manipulation.  Patient is not taking any pain medications at the moment.  Discussed posture and ergonomics.  Discussed different changes the patient continue with home exercises.  Follow-up again in 12 weeks otherwise.    Nonallopathic problems  Decision today to treat with OMT was based on Physical Exam  After verbal consent patient was treated with HVLA, ME, FPR techniques in cervical, rib, thoracic, lumbar, and sacral  areas  Patient tolerated the procedure well with improvement in symptoms  Patient given exercises, stretches and lifestyle modifications  See medications in patient instructions if given  Patient will follow up in 8-12 weeks     The above documentation has been reviewed and is accurate and complete Charles Pulley, DO         Note: This dictation was prepared with Dragon dictation along with smaller phrase technology.  Any transcriptional errors that result from this process are unintentional.

## 2021-12-05 ENCOUNTER — Ambulatory Visit: Payer: 59 | Admitting: Family Medicine

## 2021-12-05 ENCOUNTER — Other Ambulatory Visit: Payer: Self-pay | Admitting: Physician Assistant

## 2021-12-05 ENCOUNTER — Encounter: Payer: Self-pay | Admitting: Family Medicine

## 2021-12-05 VITALS — BP 110/78 | HR 66 | Ht 68.0 in | Wt 194.0 lb

## 2021-12-05 DIAGNOSIS — M9901 Segmental and somatic dysfunction of cervical region: Secondary | ICD-10-CM

## 2021-12-05 DIAGNOSIS — M9904 Segmental and somatic dysfunction of sacral region: Secondary | ICD-10-CM

## 2021-12-05 DIAGNOSIS — M357 Hypermobility syndrome: Secondary | ICD-10-CM

## 2021-12-05 DIAGNOSIS — M9908 Segmental and somatic dysfunction of rib cage: Secondary | ICD-10-CM

## 2021-12-05 DIAGNOSIS — M9902 Segmental and somatic dysfunction of thoracic region: Secondary | ICD-10-CM | POA: Diagnosis not present

## 2021-12-05 DIAGNOSIS — M9903 Segmental and somatic dysfunction of lumbar region: Secondary | ICD-10-CM | POA: Diagnosis not present

## 2021-12-05 NOTE — Patient Instructions (Signed)
Good to see you Thanks for dog stories Keep working on posture See me 2-3 months

## 2021-12-05 NOTE — Telephone Encounter (Signed)
Next Visit: 12/31/2021  Last Visit: 09/26/2021  Last Fill: 06/27/2021  DX: Idiopathic chronic gout of right ankle without tophus   Current Dose per office note 09/26/2021: allopurinol 300 mg daily   Labs: 09/28/2021 CMP: Albumin/Globulin Ratio 2.3, AST 46, ALT 110,Uric Acid 7.1  09/14/2021 CBC WNL  Okay to refill Allopurinol?

## 2021-12-05 NOTE — Assessment & Plan Note (Signed)
Still responding extremely well to osteopathic manipulation.  Patient is not taking any pain medications at the moment.  Discussed posture and ergonomics.  Discussed different changes the patient continue with home exercises.  Follow-up again in 12 weeks otherwise.

## 2021-12-17 NOTE — Progress Notes (Deleted)
Office Visit Note  Patient: Charles [name]             Date of Birth: 11-Aug-1990           MRN: [id number]             PCP: Chevis Pretty, FNP Referring: Chevis Pretty, * Visit Date: 12/31/2021 Occupation: _0 @  Subjective:  No chief complaint on file.   History of Present Illness: Charles [name] is a 31 y.o. male ***   Activities of Daily Living:  Patient reports morning stiffness for *** {minute/hour:19697}.   Patient {ACTIONS;DENIES/REPORTS:21021675::"Denies"} nocturnal pain.  Difficulty dressing/grooming: {ACTIONS;DENIES/REPORTS:21021675::"Denies"} Difficulty climbing stairs: {ACTIONS;DENIES/REPORTS:21021675::"Denies"} Difficulty getting out of chair: {ACTIONS;DENIES/REPORTS:21021675::"Denies"} Difficulty using hands for taps, buttons, cutlery, and/or writing: {ACTIONS;DENIES/REPORTS:21021675::"Denies"}  No Rheumatology ROS completed.   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 04/17/2012    Past Medical History:  Diagnosis Date   ADD (attention deficit disorder) f   Allergic rhinitis    Gout    Gynecomastia, male     Family History  Problem Relation Age of Onset   Depression Mother    Heart Problems Father    Hypertension Father    Healthy Sister    Cancer Maternal Aunt        breast   Heart attack Maternal Grandfather    Diabetes Paternal Grandmother    Diabetes Paternal Grandfather    Hyperlipidemia Paternal Grandfather    Hypertension Paternal Grandfather    Heart disease Paternal Grandfather    Past Surgical History:  Procedure Laterality Date   KNEE ARTHROSCOPY W/  MENISCECTOMY  11/23/2005   left knee - extensive bucket handle tear of medial meniscus   SHOULDER ARTHROSCOPY W/ BANKART PROCEDURE Right 08/10/2006   TONSILLECTOMY  09/16/1995   WISDOM TOOTH EXTRACTION     Social History   Social History Narrative   Not on file   Immunization History  Administered Date(s) Administered   DTaP 02/21/1989, 05/02/1989, 07/11/1989, 06/30/1990, 07/25/1993   HIB (PRP-OMP) 02/21/1989, 05/02/1989, 07/11/1989, 06/30/1990   Hepatitis A 06/17/2005, 12/27/2005   Hepatitis B 07/28/1992, 09/21/1992, 02/07/1993   IPV 02/21/1989, 05/02/1989, 06/30/1990, 07/25/1993   Influenza,Quad,Nasal, Live 12/31/2012, 02/07/2014, 01/22/2018   Influenza,inj,Quad PF,6+ Mos 12/23/2014, 01/28/2017, 01/25/2019   MMR 04/09/1990, 07/25/1993   Meningococcal Conjugate 12/27/2005   Tdap 12/22/2006     Objective: Vital Signs: There were no vitals taken for this visit.   Physical Exam   Musculoskeletal Exam: ***  CDAI Exam: CDAI Score: -- Patient Global: --; Provider Global: -- Swollen: --; Tender: -- Joint Exam 12/31/2021   No joint exam has been documented for this visit   There is currently no information documented on the homunculus. Go to the Rheumatology activity and complete the homunculus joint exam.  Investigation: No additional findings.  Imaging: No results found.  Recent Labs: Lab Results  Component Value Date   WBC 8.9 09/14/2021   HGB 17.5 09/14/2021   PLT 316 09/14/2021   NA 138 09/28/2021   K 4.1 09/28/2021   CL 98 09/28/2021   CO2 23 09/28/2021   GLUCOSE 96 09/28/2021   BUN 13 09/28/2021   CREATININE 1.12 09/28/2021   BILITOT 0.8  09/28/2021   ALKPHOS 72 09/28/2021   AST 46 (H) 09/28/2021   ALT 110 (H) 09/28/2021   PROT 7.2 09/28/2021   ALBUMIN 5.0 09/28/2021   CALCIUM 10.0 09/28/2021    Speciality Comments: No specialty comments available.  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  No orders of the defined types were placed in this encounter.   Face-to-face time spent with patient was *** minutes. Greater than 50% of time was spent in counseling and coordination of care.  Follow-Up Instructions: No follow-ups on file.   Ofilia Neas, PA-C  Note - This record has been created using Dragon software.  Chart creation errors have been sought, but may not always  have been located. Such creation errors do not reflect on  the standard of medical care.

## 2021-12-31 ENCOUNTER — Ambulatory Visit: Payer: No Typology Code available for payment source | Admitting: Physician Assistant

## 2021-12-31 DIAGNOSIS — Z87898 Personal history of other specified conditions: Secondary | ICD-10-CM

## 2021-12-31 DIAGNOSIS — F902 Attention-deficit hyperactivity disorder, combined type: Secondary | ICD-10-CM

## 2021-12-31 DIAGNOSIS — N62 Hypertrophy of breast: Secondary | ICD-10-CM

## 2021-12-31 DIAGNOSIS — F3341 Major depressive disorder, recurrent, in partial remission: Secondary | ICD-10-CM

## 2021-12-31 DIAGNOSIS — Z8659 Personal history of other mental and behavioral disorders: Secondary | ICD-10-CM

## 2021-12-31 DIAGNOSIS — I7300 Raynaud's syndrome without gangrene: Secondary | ICD-10-CM

## 2021-12-31 DIAGNOSIS — Z720 Tobacco use: Secondary | ICD-10-CM

## 2021-12-31 DIAGNOSIS — M249 Joint derangement, unspecified: Secondary | ICD-10-CM

## 2021-12-31 DIAGNOSIS — Z789 Other specified health status: Secondary | ICD-10-CM

## 2021-12-31 DIAGNOSIS — R7989 Other specified abnormal findings of blood chemistry: Secondary | ICD-10-CM

## 2021-12-31 DIAGNOSIS — D582 Other hemoglobinopathies: Secondary | ICD-10-CM

## 2021-12-31 DIAGNOSIS — E291 Testicular hypofunction: Secondary | ICD-10-CM

## 2021-12-31 DIAGNOSIS — M1A0710 Idiopathic chronic gout, right ankle and foot, without tophus (tophi): Secondary | ICD-10-CM

## 2021-12-31 NOTE — Progress Notes (Unsigned)
Office Visit Note  Patient: Charles [name]             Date of Birth: [birth year]           MRN: [id number]             PCP: Chevis Pretty, FNP Referring: Chevis Pretty, * Visit Date: 01/07/2022 Occupation: _0 @  Subjective:  Medication monitoring   History of Present Illness: Charles [name] is a 31 y.o. male with history of gout.  He is taking allopurinol 300 mg daily and colchicine 0.6 mg 1 tablet daily.   He is tolerating both medications without any side effects.  He occasionally will miss doses of these medications but he has been trying to get better about taking his daily medications are prescribed.  He has been avoiding tylenol and NSAID use and has cut back to 4 beers per week.  He denies any new medical conditions.  He denies any signs or symptoms of a gout flare.  He denies any joint swelling at this time.  He states he recently pivoted his right knee at work and is having some instability and pressure in the right knee similar to when he tore the meniscus in his left knee.  He is planning on following up at [facility] for further evaluation.     Activities of Daily Living:  Patient reports morning stiffness for 15 minutes.   Patient Denies nocturnal pain.  Difficulty dressing/grooming: Denies Difficulty climbing stairs: Denies Difficulty getting out of chair: Denies Difficulty using hands for taps, buttons, cutlery, and/or writing: Denies  Review of Systems  Constitutional:  Negative for fatigue.  HENT:  Positive for mouth dryness. Negative for mouth sores.   Eyes:  Negative for dryness.  Respiratory:  Negative for shortness of breath.   Cardiovascular:  Negative for chest pain and palpitations.  Gastrointestinal:  Negative for blood in stool, constipation and diarrhea.  Endocrine: Negative for increased urination.  Genitourinary:  Negative for involuntary urination.  Musculoskeletal:  Positive for joint pain, joint pain and morning  stiffness. Negative for gait problem, joint swelling, myalgias, muscle weakness, muscle tenderness and myalgias.  Skin:  Positive for color change. Negative for rash, hair loss and sensitivity to sunlight.  Allergic/Immunologic: Negative for susceptible to infections.  Neurological:  Negative for dizziness and headaches.  Hematological:  Negative for swollen glands.  Psychiatric/Behavioral:  Positive for depressed mood and sleep disturbance. The patient is nervous/anxious.     PMFS History:  Patient Active Problem List   Diagnosis Date Noted   Primary hypertension 09/14/2021   Somatic dysfunction of spine, sacral 07/30/2021   Hypermobility syndrome 06/06/2021   Recurrent major depressive disorder, in partial remission ([place]) 01/28/2017   Hypogonadism in male 01/28/2017   Attention deficit hyperactivity disorder (ADHD), combined type 08/03/2014   Gynecomastia, male - bilateral 04/17/2012    Past Medical History:  Diagnosis Date   ADD (attention deficit disorder) f   Allergic rhinitis    Bipolar depression ([place])    Gout    Gynecomastia, male    Hypermobility of joint    Raynauds syndrome     Family History  Problem Relation Age of Onset   Depression Mother    Heart Problems Father    Hypertension Father    Healthy Sister    Cancer Maternal Aunt        breast   Heart attack Maternal Grandfather    Diabetes Paternal Grandmother    Diabetes Paternal Grandfather  Hyperlipidemia Paternal Grandfather    Hypertension Paternal Grandfather    Heart disease Paternal Grandfather    Past Surgical History:  Procedure Laterality Date   KNEE ARTHROSCOPY W/ MENISCECTOMY  11/23/2005   left knee - extensive bucket handle tear of medial meniscus   SHOULDER ARTHROSCOPY W/ BANKART PROCEDURE Right 08/10/2006   TONSILLECTOMY  09/16/1995   WISDOM TOOTH EXTRACTION     Social History   Social History Narrative   Not on file   Immunization History  Administered Date(s) Administered    DTaP 02/21/1989, 05/02/1989, 07/11/1989, 06/30/1990, 07/25/1993   HIB (PRP-OMP) 02/21/1989, 05/02/1989, 07/11/1989, 06/30/1990   Hepatitis A 06/17/2005, 12/27/2005   Hepatitis B 07/28/1992, 09/21/1992, 02/07/1993   IPV 02/21/1989, 05/02/1989, 06/30/1990, 07/25/1993   Influenza,Quad,Nasal, Live 12/31/2012, 02/07/2014, 01/22/2018   Influenza,inj,Quad PF,6+ Mos 12/23/2014, 01/28/2017, 01/25/2019   MMR 04/09/1990, 07/25/1993   Meningococcal Conjugate 12/27/2005   Tdap 12/22/2006     Objective: Vital Signs: BP (!) 138/93 (BP Location: Left Arm, Patient Position: Sitting, Cuff Size: Normal)   Pulse 80   Resp 14   Ht _0  (1.727 m)   Wt 190 lb (86.2 kg)   BMI 28.89 kg/m    Physical Exam Vitals and nursing note reviewed.  Constitutional:      Appearance: He is well-developed.  HENT:     Head: Normocephalic and atraumatic.  Eyes:     Conjunctiva/sclera: Conjunctivae normal.     Pupils: Pupils are equal, round, and reactive to light.  Cardiovascular:     Rate and Rhythm: Normal rate and regular rhythm.     Heart sounds: Normal heart sounds.  Pulmonary:     Effort: Pulmonary effort is normal.     Breath sounds: Normal breath sounds.  Abdominal:     General: Bowel sounds are normal.     Palpations: Abdomen is soft.  Musculoskeletal:     Cervical back: Normal range of motion and neck supple.  Skin:    General: Skin is warm and dry.     Capillary Refill: Capillary refill takes less than 2 seconds.  Neurological:     Mental Status: He is alert and oriented to person, place, and time.  Psychiatric:        Behavior: Behavior normal.      Musculoskeletal Exam: C-spine, thoracic spine, and lumbar spine good ROM.  Shoulder joints and elbow joints have good ROM.  Right wrist has slightly limited extension.  No tenderness or synovitis of wrist joints or MCPs. PIPs and DIPs good ROM with no discomfort.  Hip joints have good ROM.  Some discomfort with ROM of the right knee.  No baker's  cyst palpable. No warmth or effusion of knee joints. Ankle joints have good ROM with no tenderness or joint swelling.   CDAI Exam: CDAI Score: -- Patient Global: --; Provider Global: -- Swollen: --; Tender: -- Joint Exam 01/07/2022   No joint exam has been documented for this visit   There is currently no information documented on the homunculus. Go to the Rheumatology activity and complete the homunculus joint exam.  Investigation: No additional findings.  Imaging: No results found.  Recent Labs: Lab Results  Component Value Date   WBC 8.9 09/14/2021   HGB 17.5 09/14/2021   PLT 316 09/14/2021   NA 138 09/28/2021   K 4.1 09/28/2021   CL 98 09/28/2021   CO2 23 09/28/2021   GLUCOSE 96 09/28/2021   BUN 13 09/28/2021   CREATININE 1.12 09/28/2021   BILITOT 0.8  09/28/2021   ALKPHOS 72 09/28/2021   AST 46 (H) 09/28/2021   ALT 110 (H) 09/28/2021   PROT 7.2 09/28/2021   ALBUMIN 5.0 09/28/2021   CALCIUM 10.0 09/28/2021    Speciality Comments: No specialty comments available.  Procedures:  No procedures performed Allergies: Indomethacin and Lamictal [lamotrigine]     Assessment / Plan:     Visit Diagnoses: Idiopathic chronic gout of right ankle without tophus - He has not had any signs or symptoms of a gout flare.  He is clinically doing well taking allopurinol 300 mg daily and colchicine 0.6 mg 1 tablet daily.  He has been reducing his alcohol intake (4 beers per week) and avoiding a high purine diet.   Uric acid: 7.1 on 09/28/2021- recheck CBC, CMP, and uric acid level today.  Patient was given a handout of information about dietary recommendations for gout patients.  He was advised to notify us if he develops signs or symptoms of a gout flare.  He will remain on the current treatment regimen.  He will follow up in 6 months or sooner if needed.. - Plan: CBC with Differential/Platelet, COMPLETE METABOLIC PANEL WITH GFR, Uric acid  Medication monitoring encounter - Uric acid  was 7.1 on 09/28/21. CBC and CMP updated on 09/14/21.   CBC, CMP, and uric acid level will be rechecked today. Plan: CBC with Differential/Platelet, COMPLETE METABOLIC PANEL WITH GFR  Raynaud's disease without gangrene - All autoimmune work-up had been negative. Fingertips were cool to the touch but had good capillary refill <2 seconds.  No digital ulcerations or signs of gangrene.   Hypermobility of joint - Followed by Dr. Tamala Julian. Reviewed office visit note from 12/05/21.  Patient has been responding well to osteopathic manipulation.   Elevated LFTs - He has been avoiding tylenol and NSAID use.  He has reduced his alcohol use to 4 beers weekly. AST 46 and ALT 110 on 09/28/21.   CMP updated today. Plan: COMPLETE METABOLIC PANEL WITH GFR  Alcohol use: He has reduced his alcohol intake to usually 4 beers per week but he did drink at a halloween party this weekend.    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    Follow-Up Instructions: Return in about 6 months (around 07/09/2022) for Gout.   Ofilia Neas, PA-C  Note - This record has been created using Dragon software.  Chart creation errors have been sought, but may not always  have been located. Such creation errors do not reflect on  the standard of medical care.

## 2022-01-07 ENCOUNTER — Encounter: Payer: Self-pay | Admitting: Physician Assistant

## 2022-01-07 ENCOUNTER — Ambulatory Visit: Payer: 59 | Attending: Physician Assistant | Admitting: Physician Assistant

## 2022-01-07 VITALS — BP 138/93 | HR 80 | Resp 14 | Ht 68.0 in | Wt 190.0 lb

## 2022-01-07 DIAGNOSIS — F902 Attention-deficit hyperactivity disorder, combined type: Secondary | ICD-10-CM

## 2022-01-07 DIAGNOSIS — I7300 Raynaud's syndrome without gangrene: Secondary | ICD-10-CM

## 2022-01-07 DIAGNOSIS — D582 Other hemoglobinopathies: Secondary | ICD-10-CM

## 2022-01-07 DIAGNOSIS — Z789 Other specified health status: Secondary | ICD-10-CM

## 2022-01-07 DIAGNOSIS — N62 Hypertrophy of breast: Secondary | ICD-10-CM

## 2022-01-07 DIAGNOSIS — Z720 Tobacco use: Secondary | ICD-10-CM

## 2022-01-07 DIAGNOSIS — Z8659 Personal history of other mental and behavioral disorders: Secondary | ICD-10-CM

## 2022-01-07 DIAGNOSIS — R7989 Other specified abnormal findings of blood chemistry: Secondary | ICD-10-CM

## 2022-01-07 DIAGNOSIS — M1A0710 Idiopathic chronic gout, right ankle and foot, without tophus (tophi): Secondary | ICD-10-CM

## 2022-01-07 DIAGNOSIS — E291 Testicular hypofunction: Secondary | ICD-10-CM

## 2022-01-07 DIAGNOSIS — Z87898 Personal history of other specified conditions: Secondary | ICD-10-CM

## 2022-01-07 DIAGNOSIS — F3341 Major depressive disorder, recurrent, in partial remission: Secondary | ICD-10-CM

## 2022-01-07 DIAGNOSIS — F1090 Alcohol use, unspecified, uncomplicated: Secondary | ICD-10-CM

## 2022-01-07 DIAGNOSIS — M249 Joint derangement, unspecified: Secondary | ICD-10-CM | POA: Diagnosis not present

## 2022-01-07 DIAGNOSIS — Z5181 Encounter for therapeutic drug level monitoring: Secondary | ICD-10-CM

## 2022-01-07 NOTE — Patient Instructions (Signed)

## 2022-01-08 LAB — COMPLETE METABOLIC PANEL WITH GFR
AG Ratio: 2.1 (calc) (ref 1.0–2.5)
ALT: 50 U/L — ABNORMAL HIGH (ref 9–46)
AST: 29 U/L (ref 10–40)
Albumin: 4.4 g/dL (ref 3.6–5.1)
Alkaline phosphatase (APISO): 70 U/L (ref 36–130)
BUN: 10 mg/dL (ref 7–25)
CO2: 26 mmol/L (ref 20–32)
Calcium: 9.6 mg/dL (ref 8.6–10.3)
Chloride: 104 mmol/L (ref 98–110)
Creat: 1.11 mg/dL (ref 0.60–1.26)
Globulin: 2.1 g/dL (calc) (ref 1.9–3.7)
Glucose, Bld: 110 mg/dL — ABNORMAL HIGH (ref 65–99)
Potassium: 4 mmol/L (ref 3.5–5.3)
Sodium: 140 mmol/L (ref 135–146)
Total Bilirubin: 0.4 mg/dL (ref 0.2–1.2)
Total Protein: 6.5 g/dL (ref 6.1–8.1)
eGFR: 90 mL/min/{1.73_m2} (ref >=60)

## 2022-01-08 LAB — CBC WITH DIFFERENTIAL/PLATELET
Absolute Monocytes: 512 cells/uL (ref 200–950)
Basophils Absolute: 43 cells/uL (ref 0–200)
Basophils Relative: 0.7 %
Eosinophils Absolute: 159 cells/uL (ref 15–500)
Eosinophils Relative: 2.6 %
HCT: 50.3 % — ABNORMAL HIGH (ref 38.5–50.0)
Hemoglobin: 17.4 g/dL — ABNORMAL HIGH (ref 13.2–17.1)
Lymphs Abs: 1970 cells/uL (ref 850–3900)
MCH: 30.3 pg (ref 27.0–33.0)
MCHC: 34.6 g/dL (ref 32.0–36.0)
MCV: 87.6 fL (ref 80.0–100.0)
MPV: 10.3 fL (ref 7.5–12.5)
Monocytes Relative: 8.4 %
Neutro Abs: 3416 cells/uL (ref 1500–7800)
Neutrophils Relative %: 56 %
Platelets: 308 10*3/uL (ref 140–400)
RBC: 5.74 10*6/uL (ref 4.20–5.80)
RDW: 13.1 % (ref 11.0–15.0)
Total Lymphocyte: 32.3 %
WBC: 6.1 10*3/uL (ref 3.8–10.8)

## 2022-01-08 LAB — URIC ACID: Uric Acid, Serum: 7.1 mg/dL (ref 4.0–8.0)

## 2022-01-08 NOTE — Progress Notes (Signed)
Hgb and hct are borderline elevated. Rest of CBC WNL.  ALT is borderline elevated-50. AST WNL.  Glucose is 110. Rest of CMP WNL.  Uric acid is elevated at 7.1. please advise the patient to remain on allopurinol as prescribed. Avoid a high purine rich diet and alcohol use.

## 2022-01-22 ENCOUNTER — Telehealth: Payer: Self-pay | Admitting: Nurse Practitioner

## 2022-01-22 ENCOUNTER — Encounter: Payer: Self-pay | Admitting: Family Medicine

## 2022-01-22 NOTE — Telephone Encounter (Signed)
Patient received a bill from [place] for like $300 for the Hpylori that Sharyn Lull ordered on 07/07 when he came back from [place]. [employer] says it is not covered, this test is I think he said no longer done. Needs a letter of medical necessity from her for the appeal. The first line in the letter must say appeal.

## 2022-01-22 NOTE — Telephone Encounter (Signed)
Letter given to Charles [name].

## 2022-01-30 NOTE — Progress Notes (Deleted)
  [facility] [place] Phone: [phone] Subjective:    I'm seeing this patient by the request  of:  Chevis Pretty, FNP  CC:   [id number]  ARVELL PULSIFER is a 31 y.o. male coming in with complaint of back and neck pain. OMT 01/04/2022. Patient states   Medications patient has been prescribed: None  Taking:         Reviewed prior external information including notes and imaging from previsou exam, outside providers and external EMR if available.   As well as notes that were available from care everywhere and other healthcare systems.  Past medical history, social, surgical and family history all reviewed in electronic medical record.  No pertanent information unless stated regarding to the chief complaint.   Past Medical History:  Diagnosis Date   ADD (attention deficit disorder) f   Allergic rhinitis    Bipolar depression ([place])    Gout    Gynecomastia, male    Hypermobility of joint    Raynauds syndrome     Allergies  Allergen Reactions   Indomethacin     Brain fog, balance issues, cognitive dissonance.    Lamictal [Lamotrigine] Other (See Comments)    "Muscle Spasms in back"     Review of Systems:  No headache, visual changes, nausea, vomiting, diarrhea, constipation, dizziness, abdominal pain, skin rash, fevers, chills, night sweats, weight loss, swollen lymph nodes, body aches, joint swelling, chest pain, shortness of breath, mood changes. POSITIVE muscle aches  Objective  There were no vitals taken for this visit.   General: No apparent distress alert and oriented x3 mood and affect normal, dressed appropriately.  HEENT: Pupils equal, extraocular movements intact  Respiratory: Patient's speak in full sentences and does not appear short of breath  Cardiovascular: No lower extremity edema, non tender, no erythema  Gait MSK:  Back hypermobility noted.  Tender to palpation in the  paraspinal musculature.  Seems to be more around the right sacroiliac joint.  Osteopathic findings  C2 flexed rotated and side bent right C6 flexed rotated and side bent left T3 extended rotated and side bent right inhaled rib T9 extended rotated and side bent left L2 flexed rotated and side bent right Sacrum right on right       Assessment and Plan:  No problem-specific Assessment & Plan notes found for this encounter.    Nonallopathic problems  Decision today to treat with OMT was based on Physical Exam  After verbal consent patient was treated with HVLA, ME, FPR techniques in cervical, rib, thoracic, lumbar, and sacral  areas  Patient tolerated the procedure well with improvement in symptoms  Patient given exercises, stretches and lifestyle modifications  See medications in patient instructions if given  Patient will follow up in 4-8 weeks     The above documentation has been reviewed and is accurate and complete Lyndal Pulley, DO         Note: This dictation was prepared with Dragon dictation along with smaller phrase technology. Any transcriptional errors that result from this process are unintentional.

## 2022-02-04 ENCOUNTER — Encounter: Payer: Self-pay | Admitting: Nurse Practitioner

## 2022-02-04 ENCOUNTER — Ambulatory Visit: Payer: 59 | Admitting: Nurse Practitioner

## 2022-02-04 VITALS — BP 129/91 | HR 81 | Temp 97.9°F | Resp 20 | Ht 68.0 in | Wt 186.0 lb

## 2022-02-04 DIAGNOSIS — F3341 Major depressive disorder, recurrent, in partial remission: Secondary | ICD-10-CM | POA: Diagnosis not present

## 2022-02-04 DIAGNOSIS — E291 Testicular hypofunction: Secondary | ICD-10-CM

## 2022-02-04 DIAGNOSIS — I10 Essential (primary) hypertension: Secondary | ICD-10-CM

## 2022-02-04 DIAGNOSIS — F902 Attention-deficit hyperactivity disorder, combined type: Secondary | ICD-10-CM

## 2022-02-04 MED ORDER — TESTOSTERONE CYPIONATE 200 MG/ML IM SOLN: 100.0000 mg | INTRAMUSCULAR | 1 refills | Status: DC

## 2022-02-04 MED ORDER — AMPHETAMINE-DEXTROAMPHET ER 30 MG PO CP24: 30.0000 mg | ORAL_CAPSULE | ORAL | 0 refills | Status: DC

## 2022-02-04 MED ORDER — CITALOPRAM HYDROBROMIDE 40 MG PO TABS: 40.0000 mg | ORAL_TABLET | Freq: Every day | ORAL | 1 refills | Status: DC

## 2022-02-04 MED ORDER — ARIPIPRAZOLE 10 MG PO TABS: 10.0000 mg | ORAL_TABLET | Freq: Every day | ORAL | 1 refills | Status: DC

## 2022-02-04 MED ORDER — HYDROCHLOROTHIAZIDE 25 MG PO TABS: 25.0000 mg | ORAL_TABLET | Freq: Every day | ORAL | 1 refills | Status: DC

## 2022-02-04 NOTE — Progress Notes (Signed)
Subjective:    Patient ID: Charles [name], male    DOB: [birth year], 31 y.o.   MRN: [id number]    Chief Complaint: medical management of chronic issues     HPI:  Charles [name] is a 31 y.o. who identifies as a male who was assigned male at birth.   Social history: Lives with: his parents Work history: works at a [employer] account [employer] in today for follow up of the following chronic medical issues:  1. Attention deficit hyperactivity disorder (ADHD), combined type Is on adderall daily. He has been on adderall off and on since he was in high school. He is not able to concentrate at work without meds.  2. Primary hypertension No c/o chest pain, sob or headache doesnot check blood pressure at home. BP Readings from Last 3 Encounters:  01/07/22 (!) 138/93  12/05/21 110/78  09/28/21 132/87     3. Recurrent major depressive disorder, in partial remission ([place]) He os on a combination of abilify and celexa. Was doing well.    02/04/2022   12:26 PM 09/28/2021    3:36 PM 08/02/2021   12:23 PM  Depression screen PHQ 2/9  Decreased Interest '1 1 1  '$ Down, Depressed, Hopeless 1 1 0  PHQ - 2 Score '2 2 1  '$ Altered sleeping 0 1 1  Tired, decreased energy 1 1 0  Change in appetite 0 0 0  Feeling bad or failure about yourself  1 0 0  Trouble concentrating 0 1 0  Moving slowly or fidgety/restless 0 0 0  Suicidal thoughts 0 0 0  PHQ-9 Score '4 5 2  '$ Difficult doing work/chores Somewhat difficult Somewhat difficult Not difficult at all      02/04/2022   12:26 PM 09/28/2021    3:36 PM 08/02/2021   12:23 PM 04/04/2021   12:38 PM  GAD 7 : Generalized Anxiety Score  Nervous, Anxious, on Edge '1 1 1 2  '$ Control/stop worrying 1 0 1 1  Worry too much - different things 0 '1 1 1  '$ Trouble relaxing 0 0 0 0  Restless 0 0 0 0  Easily annoyed or irritable 1 0 0 1  Afraid - awful might happen 0 0 0 0  Total GAD 7 Score '3 2 3 5  '$ Anxiety Difficulty Somewhat difficult Not difficult at all Not  difficult at all Somewhat difficult       New complaints: None today  Allergies  Allergen Reactions   Indomethacin     Brain fog, balance issues, cognitive dissonance.    Lamictal [Lamotrigine] Other (See Comments)    "Muscle Spasms in back"   Outpatient Encounter Medications as of 02/04/2022  Medication Sig   allopurinol (ZYLOPRIM) 300 MG tablet Take 1 tablet by mouth once daily   amphetamine-dextroamphetamine (ADDERALL XR) 30 MG 24 hr capsule Take 1 capsule (30 mg total) by mouth every morning.   amphetamine-dextroamphetamine (ADDERALL XR) 30 MG 24 hr capsule Take 1 capsule (30 mg total) by mouth every morning.   amphetamine-dextroamphetamine (ADDERALL XR) 30 MG 24 hr capsule Take 1 capsule (30 mg total) by mouth every morning.   ARIPiprazole (ABILIFY) 10 MG tablet Take 1 tablet (10 mg total) by mouth daily.   citalopram (CELEXA) 40 MG tablet Take 1 tablet (40 mg total) by mouth daily.   colchicine 0.6 MG tablet Take 0.'6mg'$  po qd, increase to BID for flares.   diphenhydrAMINE (BENADRYL) 25 MG tablet Take 1 tablet (25 mg total) by  mouth every 6 (six) hours as needed for itching.   EPINEPHrine 0.3 mg/0.3 mL IJ SOAJ injection Inject 0.3 mLs (0.3 mg total) into the muscle once as needed for up to 1 dose (Tongue or throat swelling).   hydrochlorothiazide (HYDRODIURIL) 25 MG tablet Take 1 tablet (25 mg total) by mouth daily.   testosterone cypionate (DEPOTESTOSTERONE CYPIONATE) 200 MG/ML injection Inject 0.5 mLs (100 mg total) into the muscle every 14 (fourteen) days.   valACYclovir (VALTREX) 1000 MG tablet Take 1 tablet (1,000 mg total) by mouth 2 (two) times daily as needed. Prn as needed for fever blisters   No facility-administered encounter medications on file as of 02/04/2022.    Past Surgical History:  Procedure Laterality Date   KNEE ARTHROSCOPY W/ MENISCECTOMY  11/23/2005   left knee - extensive bucket handle tear of medial meniscus   SHOULDER ARTHROSCOPY W/ BANKART  PROCEDURE Right 08/10/2006   TONSILLECTOMY  09/16/1995   WISDOM TOOTH EXTRACTION      Family History  Problem Relation Age of Onset   Depression Mother    Heart Problems Father    Hypertension Father    Healthy Sister    Cancer Maternal Aunt        breast   Heart attack Maternal Grandfather    Diabetes Paternal Grandmother    Diabetes Paternal Grandfather    Hyperlipidemia Paternal Grandfather    Hypertension Paternal Grandfather    Heart disease Paternal Grandfather       Controlled substance contract: 02/04/22 drug screen today     Review of Systems  Constitutional:  Negative for diaphoresis.  Eyes:  Negative for pain.  Respiratory:  Negative for shortness of breath.   Cardiovascular:  Negative for chest pain, palpitations and leg swelling.  Gastrointestinal:  Negative for abdominal pain.  Endocrine: Negative for polydipsia.  Skin:  Negative for rash.  Neurological:  Negative for dizziness, weakness and headaches.  Hematological:  Does not bruise/bleed easily.  All other systems reviewed and are negative.      Objective:   Physical Exam Vitals and nursing note reviewed.  Constitutional:      Appearance: Normal appearance. He is well-developed.  Neck:     Thyroid: No thyroid mass or thyromegaly.     Vascular: No carotid bruit or JVD.     Trachea: Phonation normal.  Cardiovascular:     Rate and Rhythm: Normal rate and regular rhythm.  Pulmonary:     Effort: Pulmonary effort is normal. No respiratory distress.     Breath sounds: Normal breath sounds.  Abdominal:     General: Bowel sounds are normal.     Palpations: Abdomen is soft.     Tenderness: There is no abdominal tenderness.  Musculoskeletal:        General: Normal range of motion.     Cervical back: Normal range of motion and neck supple.  Lymphadenopathy:     Cervical: No cervical adenopathy.  Skin:    General: Skin is warm and dry.  Neurological:     Mental Status: He is alert and oriented  to person, place, and time.  Psychiatric:        Behavior: Behavior normal.        Thought Content: Thought content normal.        Judgment: Judgment normal.     BP (!) 129/91   Pulse 81   Temp 97.9 F (36.6 C) (Temporal)   Resp 20   Ht '5\' 8"'$  (1.727 m)   Wt  186 lb (84.4 kg)   SpO2 95%   BMI 28.28 kg/m        Assessment & Plan:  [name] in today with chief complaint of ADHD   1. Attention deficit hyperactivity disorder (ADHD), combined type - amphetamine-dextroamphetamine (ADDERALL XR) 30 MG 24 hr capsule; Take 1 capsule (30 mg total) by mouth every morning.  Dispense: 30 capsule; Refill: 0 - amphetamine-dextroamphetamine (ADDERALL XR) 30 MG 24 hr capsule; Take 1 capsule (30 mg total) by mouth every morning.  Dispense: 30 capsule; Refill: 0 - amphetamine-dextroamphetamine (ADDERALL XR) 30 MG 24 hr capsule; Take 1 capsule (30 mg total) by mouth every morning.  Dispense: 30 capsule; Refill: 0 - ToxASSURE Select 13 (MW), Urine  2. Primary hypertension Low sodium diet - hydrochlorothiazide (HYDRODIURIL) 25 MG tablet; Take 1 tablet (25 mg total) by mouth daily.  Dispense: 90 tablet; Refill: 1  3. Recurrent major depressive disorder, in partial remission (HCC) Stress management - ARIPiprazole (ABILIFY) 10 MG tablet; Take 1 tablet (10 mg total) by mouth daily.  Dispense: 90 tablet; Refill: 1 - citalopram (CELEXA) 40 MG tablet; Take 1 tablet (40 mg total) by mouth daily.  Dispense: 90 tablet; Refill: 1    The above assessment and management plan was discussed with the patient. The patient verbalized understanding of and has agreed to the management plan. Patient is aware to call the clinic if symptoms persist or worsen. Patient is aware when to return to the clinic for a follow-up visit. Patient educated on when it is appropriate to go to the emergency department.   Mary-Margaret Hassell Done, FNP

## 2022-02-06 ENCOUNTER — Ambulatory Visit: Payer: No Typology Code available for payment source | Admitting: Family Medicine

## 2022-04-04 IMAGING — DX DG FOOT COMPLETE 3+V*R*
3 series · 3 of 3 positions shown · non-contrast
Comparison: 11/27/2018

CLINICAL DATA: Right heel pain

EXAM:
RIGHT FOOT COMPLETE - 3+ VIEW

[foot ap]
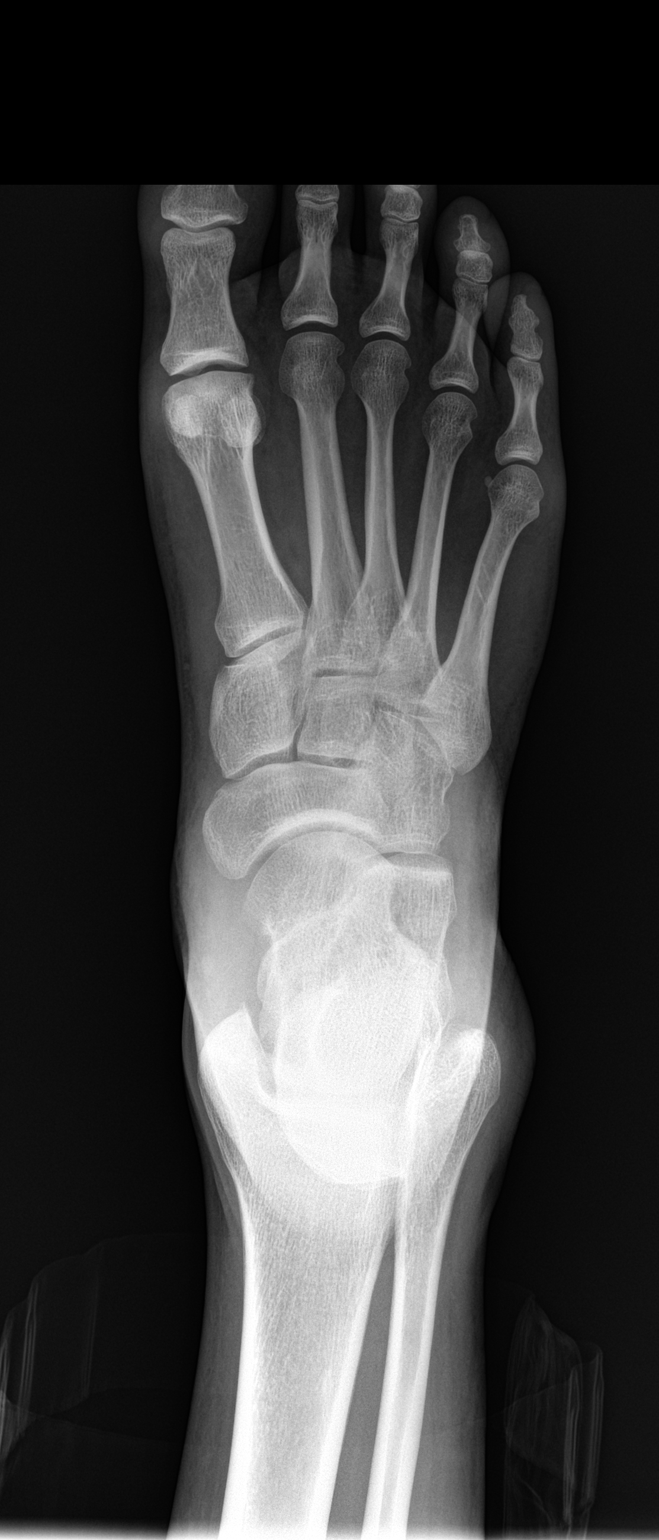

[foot obl]
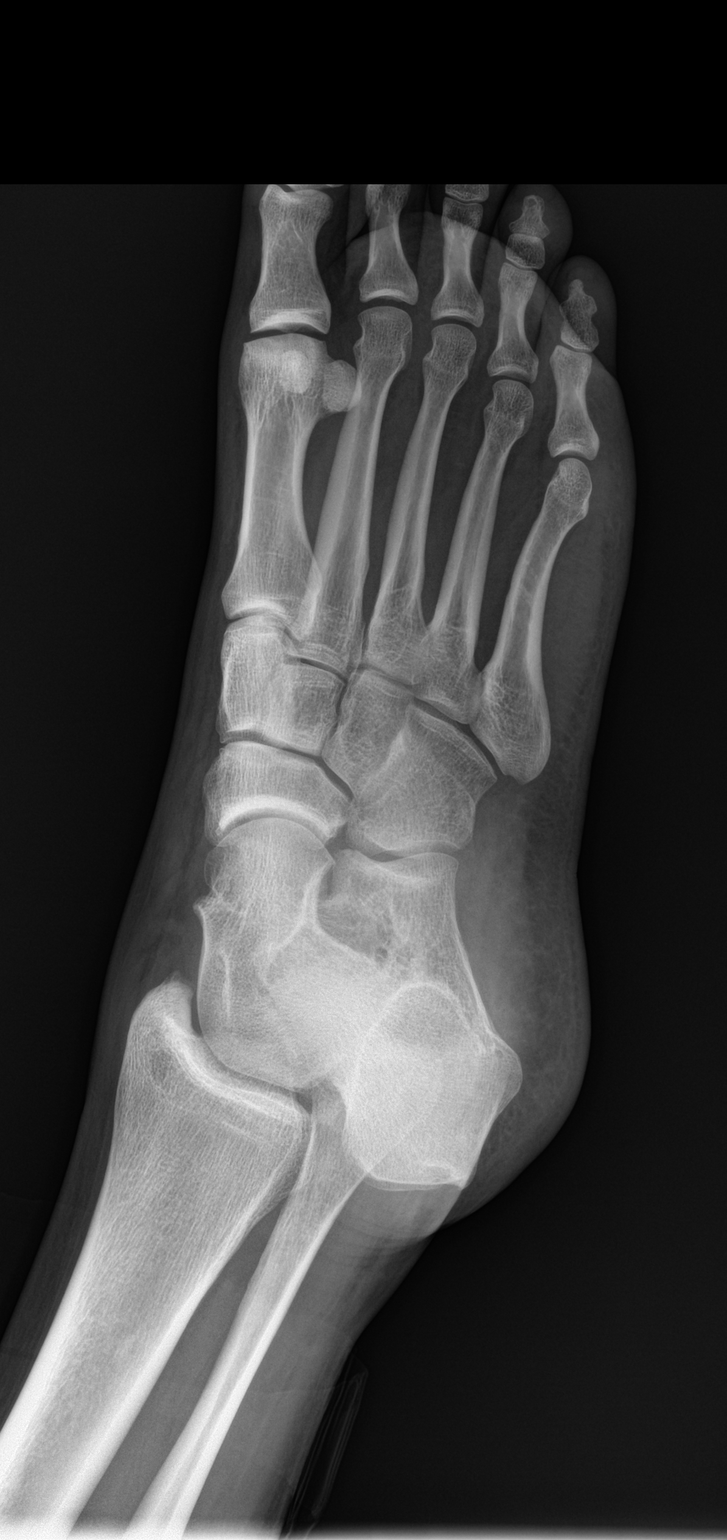

[foot lat]
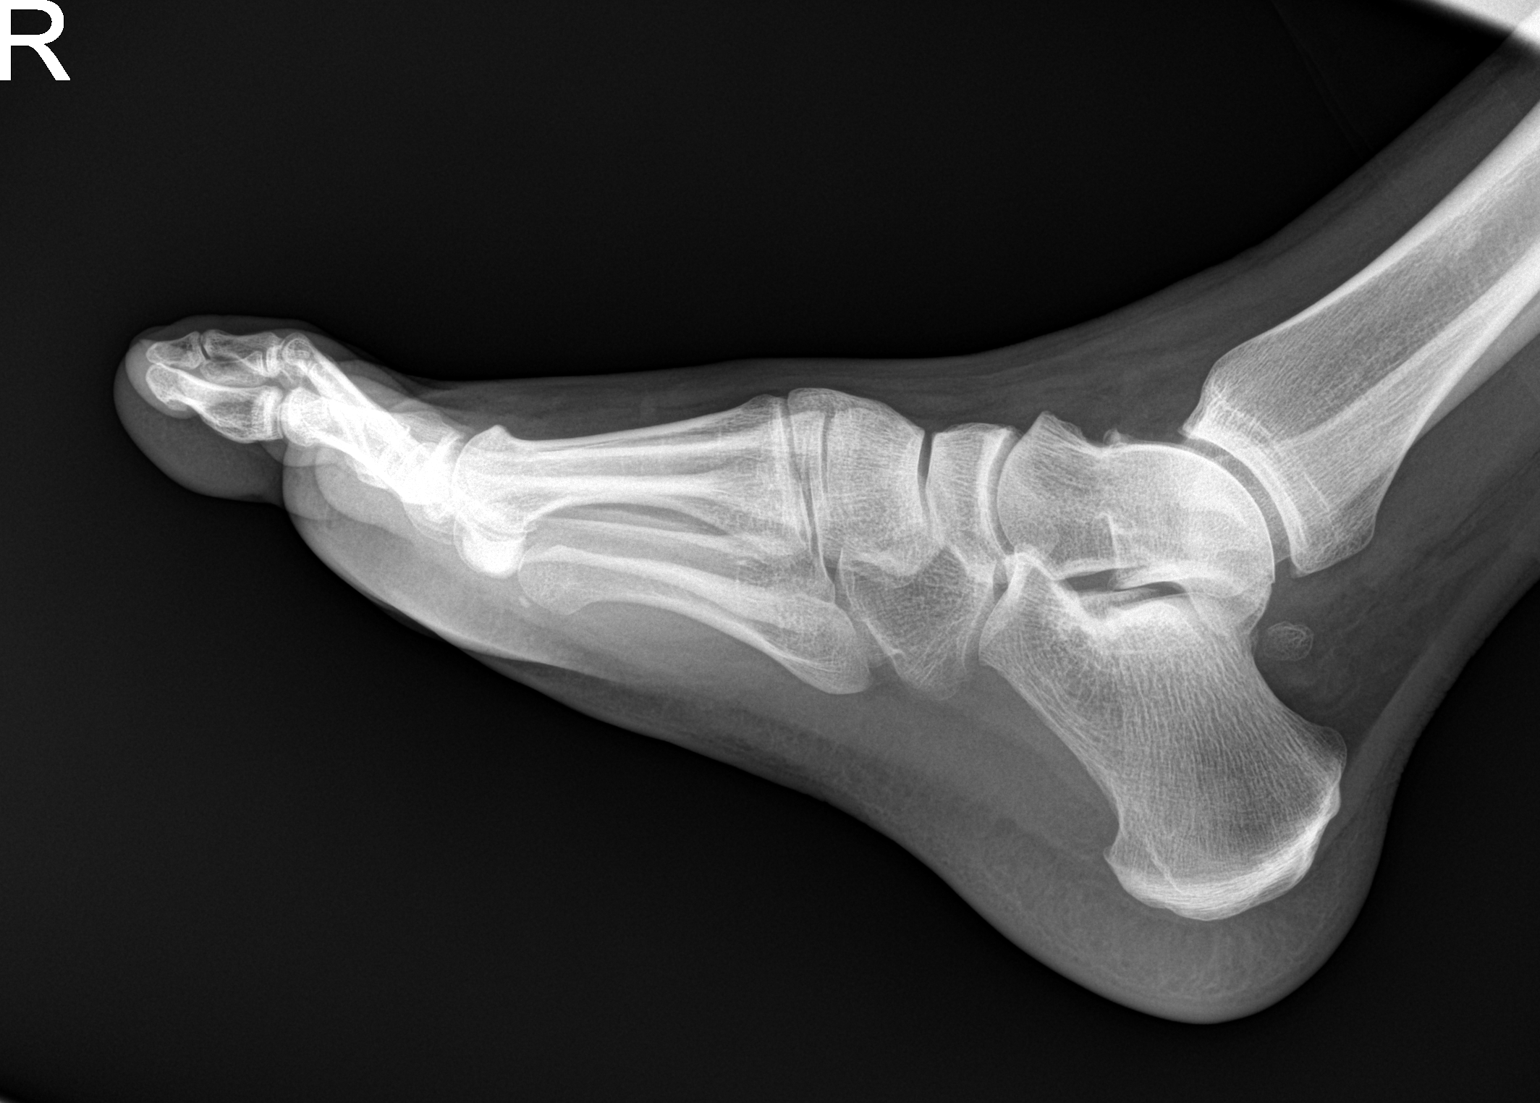

[3 of 3 positions shown; findings below may reference images not displayed]

FINDINGS: No fracture or dislocation is seen.

The joint spaces are preserved.

Visualized soft tissues are within normal limits.
IMPRESSION: Negative.

## 2022-05-07 ENCOUNTER — Ambulatory Visit: Payer: 59 | Admitting: Nurse Practitioner

## 2022-05-07 ENCOUNTER — Encounter: Payer: Self-pay | Admitting: Nurse Practitioner

## 2022-05-07 VITALS — BP 152/108 | HR 74 | Temp 97.8°F | Resp 20 | Ht 68.0 in | Wt 192.0 lb

## 2022-05-07 DIAGNOSIS — F902 Attention-deficit hyperactivity disorder, combined type: Secondary | ICD-10-CM | POA: Diagnosis not present

## 2022-05-07 DIAGNOSIS — F3341 Major depressive disorder, recurrent, in partial remission: Secondary | ICD-10-CM

## 2022-05-07 DIAGNOSIS — I10 Essential (primary) hypertension: Secondary | ICD-10-CM | POA: Diagnosis not present

## 2022-05-07 MED ORDER — AMPHETAMINE-DEXTROAMPHET ER 30 MG PO CP24: 30.0000 mg | ORAL_CAPSULE | ORAL | 0 refills | Status: DC

## 2022-05-07 NOTE — Patient Instructions (Signed)

## 2022-05-07 NOTE — Progress Notes (Signed)
Subjective:    Patient ID: Charles [name], male    DOB: [birth year], 32 y.o.   MRN: [id number]   Chief Complaint: medical management of chronic issues     HPI:  Charles [name] is a 32 y.o. who identifies as a male who was assigned male at birth.   Social history: Lives with: by himself Works: [employer] in today for follow up of the following chronic medical issues:  1. Primary hypertension No c/o chest pain, sob or headache. Does not check blood pressure at home. BP Readings from Last 3 Encounters:  05/07/22 (!) 148/103  02/04/22 (!) 129/91  01/07/22 (!) 138/93      2. Attention deficit hyperactivity disorder (ADHD), combined type Is on adderall '30mg'$  daily. He has no side effects from meds. Helps him to c incentrate at work  3. Recurrent major depressive disorder, in partial remission ([place]) Is currently on abilify and celexa. Says he is currently doing well    05/07/2022   12:06 PM 02/04/2022   12:26 PM 09/28/2021    3:36 PM 08/02/2021   12:23 PM  GAD 7 : Generalized Anxiety Score  Nervous, Anxious, on Edge 0 '1 1 1  '$ Control/stop worrying 0 1 0 1  Worry too much - different things 0 0 1 1  Trouble relaxing 0 0 0 0  Restless 0 0 0 0  Easily annoyed or irritable 0 1 0 0  Afraid - awful might happen 0 0 0 0  Total GAD 7 Score 0 '3 2 3  '$ Anxiety Difficulty Not difficult at all Somewhat difficult Not difficult at all Not difficult at all       05/07/2022   12:06 PM 02/04/2022   12:26 PM 09/28/2021    3:36 PM  Depression screen PHQ 2/9  Decreased Interest '1 1 1  '$ Down, Depressed, Hopeless '1 1 1  '$ PHQ - 2 Score '2 2 2  '$ Altered sleeping 0 0 1  Tired, decreased energy 0 1 1  Change in appetite 1 0 0  Feeling bad or failure about yourself  0 1 0  Trouble concentrating 0 0 1  Moving slowly or fidgety/restless 0 0 0  Suicidal thoughts 0 0 0  PHQ-9 Score '3 4 5  '$ Difficult doing work/chores Not difficult at all Somewhat difficult Somewhat  difficult      New complaints: None today  Allergies  Allergen Reactions   Indomethacin     Brain fog, balance issues, cognitive dissonance.    Lamictal [Lamotrigine] Other (See Comments)    "Muscle Spasms in back"   Outpatient Encounter Medications as of 05/07/2022  Medication Sig   allopurinol (ZYLOPRIM) 300 MG tablet Take 1 tablet by mouth once daily   amphetamine-dextroamphetamine (ADDERALL XR) 30 MG 24 hr capsule Take 1 capsule (30 mg total) by mouth every morning.   amphetamine-dextroamphetamine (ADDERALL XR) 30 MG 24 hr capsule Take 1 capsule (30 mg total) by mouth every morning.   amphetamine-dextroamphetamine (ADDERALL XR) 30 MG 24 hr capsule Take 1 capsule (30 mg total) by mouth every morning.   ARIPiprazole (ABILIFY) 10 MG tablet Take 1 tablet (10 mg total) by mouth daily.   citalopram (CELEXA) 40 MG tablet Take 1 tablet (40 mg total) by mouth daily.   colchicine 0.6 MG tablet Take 0.'6mg'$  po qd, increase to BID for flares.   diphenhydrAMINE (BENADRYL) 25 MG tablet Take 1 tablet (25 mg total) by mouth every 6 (six) hours as  needed for itching.   EPINEPHrine 0.3 mg/0.3 mL IJ SOAJ injection Inject 0.3 mLs (0.3 mg total) into the muscle once as needed for up to 1 dose (Tongue or throat swelling).   hydrochlorothiazide (HYDRODIURIL) 25 MG tablet Take 1 tablet (25 mg total) by mouth daily.   testosterone cypionate (DEPOTESTOSTERONE CYPIONATE) 200 MG/ML injection Inject 0.5 mLs (100 mg total) into the muscle every 14 (fourteen) days.   valACYclovir (VALTREX) 1000 MG tablet Take 1 tablet (1,000 mg total) by mouth 2 (two) times daily as needed. Prn as needed for fever blisters   No facility-administered encounter medications on file as of 05/07/2022.    Past Surgical History:  Procedure Laterality Date   KNEE ARTHROSCOPY W/ MENISCECTOMY  11/23/2005   left knee - extensive bucket handle tear of medial meniscus   SHOULDER ARTHROSCOPY W/ BANKART PROCEDURE Right 08/10/2006    TONSILLECTOMY  09/16/1995   WISDOM TOOTH EXTRACTION      Family History  Problem Relation Age of Onset   Depression Mother    Heart Problems Father    Hypertension Father    Healthy Sister    Cancer Maternal Aunt        breast   Heart attack Maternal Grandfather    Diabetes Paternal Grandmother    Diabetes Paternal Grandfather    Hyperlipidemia Paternal Grandfather    Hypertension Paternal Grandfather    Heart disease Paternal Grandfather       Controlled substance contract: 08/02/21 drug screen ordered today     Review of Systems  Constitutional:  Negative for diaphoresis.  Eyes:  Negative for pain.  Respiratory:  Negative for shortness of breath.   Cardiovascular:  Negative for chest pain, palpitations and leg swelling.  Gastrointestinal:  Negative for abdominal pain.  Endocrine: Negative for polydipsia.  Skin:  Negative for rash.  Neurological:  Negative for dizziness, weakness and headaches.  Hematological:  Does not bruise/bleed easily.  All other systems reviewed and are negative.      Objective:   Physical Exam Vitals and nursing note reviewed.  Constitutional:      Appearance: Normal appearance. He is well-developed.  Neck:     Thyroid: No thyroid mass or thyromegaly.     Vascular: No carotid bruit or JVD.     Trachea: Phonation normal.  Cardiovascular:     Rate and Rhythm: Normal rate and regular rhythm.  Pulmonary:     Effort: Pulmonary effort is normal. No respiratory distress.     Breath sounds: Normal breath sounds.  Abdominal:     General: Bowel sounds are normal.     Palpations: Abdomen is soft.     Tenderness: There is no abdominal tenderness.  Musculoskeletal:        General: Normal range of motion.     Cervical back: Normal range of motion and neck supple.  Lymphadenopathy:     Cervical: No cervical adenopathy.  Skin:    General: Skin is warm and dry.  Neurological:     Mental Status: He is alert and oriented to person, place, and  time.  Psychiatric:        Behavior: Behavior normal.        Thought Content: Thought content normal.        Judgment: Judgment normal.    BP 132/85   Pulse 74   Temp 97.8 F (36.6 C) (Temporal)   Resp 20   Ht '5\' 8"'$  (1.727 m)   Wt 192 lb (87.1 kg)   BMI  29.19 kg/m         Assessment & Plan:    JANELLE HATHORNE in today with chief complaint of ADHD   1. Primary hypertension Low sodium diet Keep diary of blood sugars at home. Let me know if staying above [id number] systolic or [id number] dialstolic  2. Attention deficit hyperactivity disorder (ADHD), combined type Stress management - ToxASSURE Select 13 (MW), Urine - amphetamine-dextroamphetamine (ADDERALL XR) 30 MG 24 hr capsule; Take 1 capsule (30 mg total) by mouth every morning.  Dispense: 30 capsule; Refill: 0 - amphetamine-dextroamphetamine (ADDERALL XR) 30 MG 24 hr capsule; Take 1 capsule (30 mg total) by mouth every morning.  Dispense: 30 capsule; Refill: 0 - amphetamine-dextroamphetamine (ADDERALL XR) 30 MG 24 hr capsule; Take 1 capsule (30 mg total) by mouth every morning.  Dispense: 30 capsule; Refill: 0  3. Recurrent major depressive disorder, in partial remission ([place]) Continue current meds    The above assessment and management plan was discussed with the patient. The patient verbalized understanding of and has agreed to the management plan. Patient is aware to call the clinic if symptoms persist or worsen. Patient is aware when to return to the clinic for a follow-up visit. Patient educated on when it is appropriate to go to the emergency department.   Mary-Margaret Hassell Done, FNP

## 2022-05-10 LAB — TOXASSURE SELECT 13 (MW), URINE

## 2022-06-10 ENCOUNTER — Other Ambulatory Visit: Payer: Self-pay | Admitting: *Deleted

## 2022-06-10 MED ORDER — ALLOPURINOL 300 MG PO TABS: 300.0000 mg | ORAL_TABLET | Freq: Every day | ORAL | 0 refills | Status: DC

## 2022-06-10 MED ORDER — COLCHICINE 0.6 MG PO TABS: ORAL_TABLET | ORAL | 3 refills | Status: DC

## 2022-06-10 NOTE — Telephone Encounter (Signed)
Patient called the office and requested a refill on Allopurinol and Colchicine to be sent to [facility].   Last Fill: 05/15/2021 (Colchicine), 12/05/2021 (Allopurinol)  Labs: 01/07/2022 Hgb and hct are borderline elevated. Rest of CBC WNL. ALT is borderline elevated-50. AST WNL. Glucose is 110. Rest of CMP WNL. Uric acid is elevated at 7.1.  Next Visit: 07/12/2022  Last Visit: 01/07/2022  DX:  Idiopathic chronic gout of right ankle without tophus   Current Dose per office note 01/07/2022: allopurinol 300 mg daily and colchicine 0.6 mg 1 tablet daily.   Okay to refill Colchicine and Allopurinol?

## 2022-07-01 NOTE — Progress Notes (Signed)
Office Visit Note  Patient: Charles [name]             Date of Birth: May 20, 1990           MRN: [id number]             PCP: Bennie Pierini, FNP Referring: Bennie Pierini, * Visit Date: 07/12/2022 Occupation: @GUAROCC @  Subjective:  Medication management  History of Present Illness: Charles [name] is a 32 y.o. male with history of gout.  He states he tries to take allopurinol and colchicine on a regular basis but sometimes he skips the medication.  He had a flare of gout in his right ankle around Easter.  Patient believes that he consumed more than regular amount of pork which triggered the flare.  He continues to drink 3 glasses of beer at night.  He is trying to cut back on the alcohol intake.  He has not had any other flares since the last visit.  None of the joints are painful today.    Activities of Daily Living:  Patient reports morning stiffness for 10-15 minutes.   Patient Denies nocturnal pain.  Difficulty dressing/grooming: Denies Difficulty climbing stairs: Denies Difficulty getting out of chair: Denies Difficulty using hands for taps, buttons, cutlery, and/or writing: Denies  Review of Systems  Constitutional:  Positive for fatigue.  HENT:  Negative for mouth sores and mouth dryness.   Eyes:  Negative for dryness.  Respiratory:  Negative for shortness of breath.   Cardiovascular:  Negative for chest pain and palpitations.  Gastrointestinal:  Negative for blood in stool, constipation and diarrhea.  Endocrine: Negative for increased urination.  Genitourinary:  Negative for involuntary urination.  Musculoskeletal:  Positive for morning stiffness. Negative for joint pain, gait problem, joint pain, joint swelling, myalgias, muscle weakness, muscle tenderness and myalgias.  Skin:  Negative for color change, rash, hair loss and sensitivity to sunlight.  Allergic/Immunologic: Negative for susceptible to infections.  Neurological:  Negative for dizziness and  headaches.  Hematological:  Negative for swollen glands.  Psychiatric/Behavioral:  Positive for sleep disturbance. Negative for depressed mood. The patient is not nervous/anxious.     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 04/17/2012    Past Medical History:  Diagnosis Date   ADD (attention deficit disorder) f   Allergic rhinitis    Bipolar depression (HCC)    Gout    Gynecomastia, male    Hypermobility of joint    Raynauds syndrome     Family History  Problem Relation Age of Onset   Depression Mother    Heart Problems Father    Hypertension Father    Healthy Sister    Cancer Maternal Aunt        breast   Heart attack Maternal Grandfather    Diabetes Paternal Grandmother    Diabetes Paternal Grandfather    Hyperlipidemia Paternal Grandfather    Hypertension Paternal Grandfather    Heart disease Paternal Grandfather    Past Surgical History:  Procedure Laterality Date   KNEE ARTHROSCOPY W/ MENISCECTOMY  11/23/2005   left knee - extensive bucket handle tear of medial meniscus   SHOULDER ARTHROSCOPY W/ BANKART PROCEDURE  Right 08/10/2006   TONSILLECTOMY  09/16/1995   WISDOM TOOTH EXTRACTION     Social History   Social History Narrative   Not on file   Immunization History  Administered Date(s) Administered   DTaP 02/21/1989, 05/02/1989, 07/11/1989, 06/30/1990, 07/25/1993   HIB (PRP-OMP) 02/21/1989, 05/02/1989, 07/11/1989, 06/30/1990   Hepatitis A 06/17/2005, 12/27/2005   Hepatitis B 07/28/1992, 09/21/1992, 02/07/1993   IPV 02/21/1989, 05/02/1989, 06/30/1990, 07/25/1993   Influenza,Quad,Nasal, Live  12/31/2012, 02/07/2014, 01/22/2018   Influenza,inj,Quad PF,6+ Mos 12/23/2014, 01/28/2017, 01/25/2019   MMR 04/09/1990, 07/25/1993   Meningococcal Conjugate 12/27/2005   Tdap 12/22/2006     Objective: Vital Signs: BP (!) 142/92 (BP Location: Left Arm, Patient Position: Sitting, Cuff Size: Normal)   Pulse 69   Resp 16   Ht 5\' 8"  (1.727 m)   Wt 191 lb 9.6 oz (86.9 kg)   BMI 29.13 kg/m    Physical Exam Vitals and nursing note reviewed.  Constitutional:      Appearance: He is well-developed.  HENT:     Head: Normocephalic and atraumatic.  Eyes:     Conjunctiva/sclera: Conjunctivae normal.     Pupils: Pupils are equal, round, and reactive to light.  Cardiovascular:     Rate and Rhythm: Normal rate and regular rhythm.     Heart sounds: Normal heart sounds.  Pulmonary:     Effort: Pulmonary effort is normal.     Breath sounds: Normal breath sounds.  Abdominal:     General: Bowel sounds are normal.     Palpations: Abdomen is soft.  Musculoskeletal:     Cervical back: Normal range of motion and neck supple.  Skin:    General: Skin is warm and dry.     Capillary Refill: Capillary refill takes less than 2 seconds.  Neurological:     Mental Status: He is alert and oriented to person, place, and time.  Psychiatric:        Behavior: Behavior normal.      Musculoskeletal Exam: Cervical, thoracic and lumbar spine were in good range of motion.  Shoulder joints, elbow joints, wrist joints, MCPs PIPs and DIPs were in good range of motion with no synovitis.  Hip joints, knee joints, ankles, MTPs and PIPs with good range of motion with no synovitis.  Hypermobility of the joints was noted.  CDAI Exam: CDAI Score: -- Patient Global: --; Provider Global: -- Swollen: --; Tender: -- Joint Exam 07/12/2022   No joint exam has been documented for this visit   There is currently no information documented on the homunculus. Go to the Rheumatology activity and complete the homunculus joint  exam.  Investigation: No additional findings.  Imaging: No results found.  Recent Labs: Lab Results  Component Value Date   WBC 6.1 01/07/2022   HGB 17.4 (H) 01/07/2022   PLT 308 01/07/2022   NA 140 01/07/2022   K 4.0 01/07/2022   CL 104 01/07/2022   CO2 26 01/07/2022   GLUCOSE 110 (H) 01/07/2022   BUN 10 01/07/2022   CREATININE 1.11 01/07/2022   BILITOT 0.4 01/07/2022   ALKPHOS 72 09/28/2021   AST 29 01/07/2022   ALT 50 (H) 01/07/2022   PROT 6.5 01/07/2022   ALBUMIN 5.0 09/28/2021   CALCIUM 9.6 01/07/2022    Speciality Comments: No specialty comments available.  Procedures:  No procedures performed Allergies: Indomethacin, Lamictal [lamotrigine], and Peanut-containing drug products   Assessment / Plan:     Visit Diagnoses: Idiopathic chronic gout of right ankle without tophus -patient  recalls having only 1 flare of gout since the last visit.  He states the flare was in his right ankle around [place] time.  He relates it to consuming extra amount of pork.  He continues to drink 3 glasses of beer every night.  He states he is trying to cut back on the alcohol intake.  He continues to take allopurinol 300 mg daily and colchicine 0.6 mg 1 tablet daily.  However he states he forgets to take medicine some days.  Last uric acid: 7.1 on 01/07/2022.  I will check uric acid level today.  Low purine diet and decreasing the alcohol intake was discussed at length.  Handout was placed in the AVS.  He was also advised to take allopurinol on a regular basis.  The goal is to achieve uric acid value between 4 and 5.  Medication monitoring encounter-will check CBC, CMP and uric acid today.  CBC obtained on January 07, 2022 showed hemoglobin 17.4 and ALT 50.  His liver functions have improved.  Will contact him after lab results are available.  Raynaud's disease without gangrene -he denies any renal symptoms today.  All autoimmune work-up had been negative.  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 Katrinka Blazing.  He is followed by Dr. Katrinka Blazing.  Elevated LFTs-He had elevated LFTs in July 2023 most likely due to alcohol use.  LFTs were improved in October.  Will check labs again today.  Alcohol use-patient states that he has been consuming 3 glasses of beer every night.  Decreasing alcohol intake and also switching to wine was discussed.  History of diarrhea-resolved.  Elevated hemoglobin (HCC)-will continue to monitor.  Elevated blood pressure reading-his blood pressure was 147/88.  Repeat blood pressure was 142/92.  Patient was advised to monitor blood pressure closely and follow-up with his PCP.  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  Recurrent major depressive disorder, in partial remission (HCC)-he is on Celexa and Abilify.  Patient states his depression is better.  Orders: Orders Placed This Encounter  Procedures   CBC with Differential/Platelet   COMPLETE METABOLIC PANEL WITH GFR   Uric acid   No orders of the defined types were placed in this encounter.    Follow-Up Instructions: Return in about 6 months (around 01/12/2023) for Gout.   Pollyann Savoy, MD  Note - This record has been created using Animal nutritionist.  Chart creation errors have been sought, but may not always  have been located. Such creation errors do not reflect on  the standard of medical care.

## 2022-07-12 ENCOUNTER — Ambulatory Visit: Payer: 59 | Attending: Rheumatology | Admitting: Rheumatology

## 2022-07-12 ENCOUNTER — Encounter: Payer: Self-pay | Admitting: Rheumatology

## 2022-07-12 VITALS — BP 142/92 | HR 69 | Resp 16 | Ht 68.0 in | Wt 191.6 lb

## 2022-07-12 DIAGNOSIS — E291 Testicular hypofunction: Secondary | ICD-10-CM

## 2022-07-12 DIAGNOSIS — Z8659 Personal history of other mental and behavioral disorders: Secondary | ICD-10-CM

## 2022-07-12 DIAGNOSIS — I7300 Raynaud's syndrome without gangrene: Secondary | ICD-10-CM | POA: Diagnosis not present

## 2022-07-12 DIAGNOSIS — Z87898 Personal history of other specified conditions: Secondary | ICD-10-CM

## 2022-07-12 DIAGNOSIS — M249 Joint derangement, unspecified: Secondary | ICD-10-CM

## 2022-07-12 DIAGNOSIS — Z720 Tobacco use: Secondary | ICD-10-CM

## 2022-07-12 DIAGNOSIS — F1090 Alcohol use, unspecified, uncomplicated: Secondary | ICD-10-CM

## 2022-07-12 DIAGNOSIS — R7989 Other specified abnormal findings of blood chemistry: Secondary | ICD-10-CM

## 2022-07-12 DIAGNOSIS — Z789 Other specified health status: Secondary | ICD-10-CM

## 2022-07-12 DIAGNOSIS — Z5181 Encounter for therapeutic drug level monitoring: Secondary | ICD-10-CM | POA: Diagnosis not present

## 2022-07-12 DIAGNOSIS — M1A0710 Idiopathic chronic gout, right ankle and foot, without tophus (tophi): Secondary | ICD-10-CM | POA: Diagnosis not present

## 2022-07-12 DIAGNOSIS — F902 Attention-deficit hyperactivity disorder, combined type: Secondary | ICD-10-CM

## 2022-07-12 DIAGNOSIS — D582 Other hemoglobinopathies: Secondary | ICD-10-CM

## 2022-07-12 DIAGNOSIS — R030 Elevated blood-pressure reading, without diagnosis of hypertension: Secondary | ICD-10-CM

## 2022-07-12 DIAGNOSIS — F3341 Major depressive disorder, recurrent, in partial remission: Secondary | ICD-10-CM

## 2022-07-12 DIAGNOSIS — N62 Hypertrophy of breast: Secondary | ICD-10-CM

## 2022-07-12 NOTE — Patient Instructions (Signed)

## 2022-07-14 NOTE — Progress Notes (Signed)
Uric acid is 6.3 which is mildly elevated.  Patient continue current treatment for gout.  Please advise low purine diet and abstinence from alcohol.  Liver function is elevated due to alcohol use.  CBC is normal except elevated hemoglobin.  Please forward results to his PCP.

## 2022-07-15 LAB — CBC WITH DIFFERENTIAL/PLATELET
Absolute Monocytes: 477 cells/uL (ref 200–950)
Basophils Absolute: 39 cells/uL (ref 0–200)
Basophils Relative: 0.5 %
Eosinophils Absolute: 200 cells/uL (ref 15–500)
Eosinophils Relative: 2.6 %
HCT: 51.8 % — ABNORMAL HIGH (ref 38.5–50.0)
Hemoglobin: 17.8 g/dL — ABNORMAL HIGH (ref 13.2–17.1)
Lymphs Abs: 2179 cells/uL (ref 850–3900)
MCH: 30.8 pg (ref 27.0–33.0)
MCHC: 34.4 g/dL (ref 32.0–36.0)
MCV: 89.8 fL (ref 80.0–100.0)
MPV: 9.8 fL (ref 7.5–12.5)
Monocytes Relative: 6.2 %
Neutro Abs: 4805 cells/uL (ref 1500–7800)
Neutrophils Relative %: 62.4 %
Platelets: 289 10*3/uL (ref 140–400)
RBC: 5.77 10*6/uL (ref 4.20–5.80)
RDW: 12.9 % (ref 11.0–15.0)
Total Lymphocyte: 28.3 %
WBC: 7.7 10*3/uL (ref 3.8–10.8)

## 2022-07-15 LAB — COMPLETE METABOLIC PANEL WITH GFR
AG Ratio: 2.1 (calc) (ref 1.0–2.5)
ALT: 77 U/L — ABNORMAL HIGH (ref 9–46)
AST: 33 U/L (ref 10–40)
Albumin: 4.5 g/dL (ref 3.6–5.1)
Alkaline phosphatase (APISO): 70 U/L (ref 36–130)
BUN: 14 mg/dL (ref 7–25)
CO2: 28 mmol/L (ref 20–32)
Calcium: 9.5 mg/dL (ref 8.6–10.3)
Chloride: 102 mmol/L (ref 98–110)
Creat: 1.04 mg/dL (ref 0.60–1.26)
Globulin: 2.1 g/dL (calc) (ref 1.9–3.7)
Glucose, Bld: 105 mg/dL — ABNORMAL HIGH (ref 65–99)
Potassium: 3.9 mmol/L (ref 3.5–5.3)
Sodium: 140 mmol/L (ref 135–146)
Total Bilirubin: 0.5 mg/dL (ref 0.2–1.2)
Total Protein: 6.6 g/dL (ref 6.1–8.1)
eGFR: 97 mL/min/{1.73_m2} (ref >=60)

## 2022-07-15 LAB — SPECIMEN COMPROMISED

## 2022-07-15 LAB — URIC ACID: Uric Acid, Serum: 6.3 mg/dL (ref 4.0–8.0)

## 2022-07-25 ENCOUNTER — Ambulatory Visit: Payer: 59 | Admitting: Nurse Practitioner

## 2022-07-25 ENCOUNTER — Encounter: Payer: Self-pay | Admitting: Nurse Practitioner

## 2022-07-25 VITALS — BP 132/84 | HR 73 | Temp 98.2°F | Resp 20 | Ht 68.0 in | Wt 195.0 lb

## 2022-07-25 DIAGNOSIS — F902 Attention-deficit hyperactivity disorder, combined type: Secondary | ICD-10-CM

## 2022-07-25 DIAGNOSIS — F3341 Major depressive disorder, recurrent, in partial remission: Secondary | ICD-10-CM

## 2022-07-25 DIAGNOSIS — I10 Essential (primary) hypertension: Secondary | ICD-10-CM

## 2022-07-25 MED ORDER — AMPHETAMINE-DEXTROAMPHET ER 30 MG PO CP24
30.0000 mg | ORAL_CAPSULE | ORAL | 0 refills | Status: DC
Start: 2022-10-04 — End: 2022-09-24

## 2022-07-25 MED ORDER — AMPHETAMINE-DEXTROAMPHET ER 30 MG PO CP24
30.0000 mg | ORAL_CAPSULE | ORAL | 0 refills | Status: DC
Start: 2022-09-04 — End: 2022-09-24

## 2022-07-25 MED ORDER — AMPHETAMINE-DEXTROAMPHET ER 30 MG PO CP24
30.0000 mg | ORAL_CAPSULE | ORAL | 0 refills | Status: DC
Start: 2022-08-05 — End: 2022-09-24

## 2022-07-25 NOTE — Progress Notes (Signed)
Subjective:    Patient ID: SUGAR LULGJURAJ, male    DOB: [birth year], 32 y.o.   MRN: [id number]   Chief Complaint: medical management of chronic issues     HPI:  Charles [name] is a 32 y.o. who identifies as a male who was assigned male at birth.   Social history: Lives with: by himself Work history: [employer]   Comes in today for follow up of the following chronic medical issues:  1. Primary hypertension No c/o chest pain, sob or headache. Doe snot check bloodpressure at home' BP Readings from Last 3 Encounters:  07/12/22 (!) 142/92  05/07/22 (!) 152/108  02/04/22 (!) 129/91     2. Recurrent major depressive disorder, in partial remission (HCC) Is on abilify and celexa and is doing well.    07/25/2022   12:18 PM 05/07/2022   12:06 PM 02/04/2022   12:26 PM  Depression screen PHQ 2/9  Decreased Interest 1 1 1   Down, Depressed, Hopeless 1 1 1   PHQ - 2 Score 2 2 2   Altered sleeping 1 0 0  Tired, decreased energy 0 0 1  Change in appetite 0 1 0  Feeling bad or failure about yourself  1 0 1  Trouble concentrating 0 0 0  Moving slowly or fidgety/restless 0 0 0  Suicidal thoughts 0 0 0  PHQ-9 Score 4 3 4   Difficult doing work/chores Not difficult at all Not difficult at all Somewhat difficult     3. Attention deficit hyperactivity disorder (ADHD), combined type Has been on adhd meds for several years. Is not able to concentrate at work without meds. He is currently on addera;; 30mg  daiy. Denes any medication side effects.   New complaints: None today  Allergies  Allergen Reactions   Indomethacin     Brain fog, balance issues, cognitive dissonance.    Lamictal [Lamotrigine] Other (See Comments)    "Muscle Spasms in back"   Peanut-Containing Drug Products     Specifically [place] nuts   Outpatient Encounter Medications as of 07/25/2022  Medication Sig   allopurinol (ZYLOPRIM) 300 MG tablet Take 1 tablet (300 mg total) by mouth daily.    amphetamine-dextroamphetamine (ADDERALL XR) 30 MG 24 hr capsule Take 1 capsule (30 mg total) by mouth every morning.   amphetamine-dextroamphetamine (ADDERALL XR) 30 MG 24 hr capsule Take 1 capsule (30 mg total) by mouth every morning.   amphetamine-dextroamphetamine (ADDERALL XR) 30 MG 24 hr capsule Take 1 capsule (30 mg total) by mouth every morning.   ARIPiprazole (ABILIFY) 10 MG tablet Take 1 tablet (10 mg total) by mouth daily.   citalopram (CELEXA) 40 MG tablet Take 1 tablet (40 mg total) by mouth daily.   colchicine 0.6 MG tablet Take 0.6mg  po qd, increase to BID for flares.   diphenhydrAMINE (BENADRYL) 25 MG tablet Take 1 tablet (25 mg total) by mouth every 6 (six) hours as needed for itching.   EPINEPHrine 0.3 mg/0.3 mL IJ SOAJ injection Inject 0.3 mLs (0.3 mg total) into the muscle once as needed for up to 1 dose (Tongue or throat swelling).   hydrochlorothiazide (HYDRODIURIL) 25 MG tablet Take 1 tablet (25 mg total) by mouth daily.   testosterone cypionate (DEPOTESTOSTERONE CYPIONATE) 200 MG/ML injection Inject 0.5 mLs (100 mg total) into the muscle every 14 (fourteen) days.   valACYclovir (VALTREX) 1000 MG tablet Take 1 tablet (1,000 mg total) by mouth 2 (two) times daily as needed. Prn as needed for fever blisters   No  facility-administered encounter medications on file as of 07/25/2022.    Past Surgical History:  Procedure Laterality Date   KNEE ARTHROSCOPY W/ MENISCECTOMY  11/23/2005   left knee - extensive bucket handle tear of medial meniscus   SHOULDER ARTHROSCOPY W/ BANKART PROCEDURE Right 08/10/2006   TONSILLECTOMY  09/16/1995   WISDOM TOOTH EXTRACTION      Family History  Problem Relation Age of Onset   Depression Mother    Heart Problems Father    Hypertension Father    Healthy Sister    Cancer Maternal Aunt        breast   Heart attack Maternal Grandfather    Diabetes Paternal Grandmother    Diabetes Paternal Grandfather    Hyperlipidemia Paternal Grandfather     Hypertension Paternal Grandfather    Heart disease Paternal Grandfather       Controlled substance contract: n/a     Review of Systems  Constitutional:  Negative for diaphoresis.  Eyes:  Negative for pain.  Respiratory:  Negative for shortness of breath.   Cardiovascular:  Negative for chest pain, palpitations and leg swelling.  Gastrointestinal:  Negative for abdominal pain.  Endocrine: Negative for polydipsia.  Skin:  Negative for rash.  Neurological:  Negative for dizziness, weakness and headaches.  Hematological:  Does not bruise/bleed easily.  All other systems reviewed and are negative.      Objective:   Physical Exam Vitals and nursing note reviewed.  Constitutional:      Appearance: Normal appearance. He is well-developed.  Neck:     Thyroid: No thyroid mass or thyromegaly.     Vascular: No carotid bruit or JVD.     Trachea: Phonation normal.  Cardiovascular:     Rate and Rhythm: Normal rate and regular rhythm.  Pulmonary:     Effort: Pulmonary effort is normal. No respiratory distress.     Breath sounds: Normal breath sounds.  Abdominal:     General: Bowel sounds are normal.     Palpations: Abdomen is soft.     Tenderness: There is no abdominal tenderness.  Musculoskeletal:        General: Normal range of motion.     Cervical back: Normal range of motion and neck supple.  Lymphadenopathy:     Cervical: No cervical adenopathy.  Skin:    General: Skin is warm and dry.  Neurological:     Mental Status: He is alert and oriented to person, place, and time.  Psychiatric:        Behavior: Behavior normal.        Thought Content: Thought content normal.        Judgment: Judgment normal.    BP 132/84   Pulse 73   Temp 98.2 F (36.8 C) (Temporal)   Resp 20   Ht 5\' 8"  (1.727 m)   Wt 195 lb (88.5 kg)   SpO2 95%   BMI 29.65 kg/m         Assessment & Plan:   Charles [name] comes in today with chief complaint of ADHD   Diagnosis and orders  addressed:  1. Primary hypertension Dash diet  2. Recurrent major depressive disorder, in partial remission (HCC) Stress management  3. Attention deficit hyperactivity disorder (ADHD), combined type - amphetamine-dextroamphetamine (ADDERALL XR) 30 MG 24 hr capsule; Take 1 capsule (30 mg total) by mouth every morning.  Dispense: 30 capsule; Refill: 0 - amphetamine-dextroamphetamine (ADDERALL XR) 30 MG 24 hr capsule; Take 1 capsule (30 mg total) by mouth  every morning.  Dispense: 30 capsule; Refill: 0 - amphetamine-dextroamphetamine (ADDERALL XR) 30 MG 24 hr capsule; Take 1 capsule (30 mg total) by mouth every morning.  Dispense: 30 capsule; Refill: 0   Labs pending Health Maintenance reviewed Diet and exercise encouraged  Follow up plan: 3 months   Mary-Margaret Daphine Deutscher, FNP

## 2022-09-24 ENCOUNTER — Ambulatory Visit: Payer: 59 | Admitting: Nurse Practitioner

## 2022-09-24 ENCOUNTER — Encounter: Payer: Self-pay | Admitting: Nurse Practitioner

## 2022-09-24 VITALS — BP 147/101 | HR 67 | Temp 98.0°F | Resp 20 | Ht 68.0 in | Wt 193.0 lb

## 2022-09-24 DIAGNOSIS — F902 Attention-deficit hyperactivity disorder, combined type: Secondary | ICD-10-CM

## 2022-09-24 DIAGNOSIS — N475 Adhesions of prepuce and glans penis: Secondary | ICD-10-CM

## 2022-09-24 DIAGNOSIS — Z113 Encounter for screening for infections with a predominantly sexual mode of transmission: Secondary | ICD-10-CM | POA: Diagnosis not present

## 2022-09-24 MED ORDER — AMPHETAMINE-DEXTROAMPHET ER 30 MG PO CP24
30.0000 mg | ORAL_CAPSULE | ORAL | 0 refills | Status: DC
Start: 2022-11-23 — End: 2022-12-13

## 2022-09-24 MED ORDER — AMPHETAMINE-DEXTROAMPHET ER 30 MG PO CP24
30.0000 mg | ORAL_CAPSULE | ORAL | 0 refills | Status: DC
Start: 2022-09-24 — End: 2022-12-13

## 2022-09-24 MED ORDER — AMPHETAMINE-DEXTROAMPHET ER 30 MG PO CP24
30.0000 mg | ORAL_CAPSULE | ORAL | 0 refills | Status: DC
Start: 2022-10-24 — End: 2022-12-13

## 2022-09-24 NOTE — Progress Notes (Signed)
Subjective:    Patient ID: Charles [name], male    DOB: August 26, 1990, 32 y.o.   MRN: [id number]   Chief Complaint: Discuss meds (Wants to increase adderrall/)   HPI  Patient comes in wanting to discuss adderall 30mg . He says it is not working as good as it use to. Says he is starting  to loose concentration. Meds are starting to wear off earlier. No medication side effects. He has been on adhd meds since he was in middle school. Wants to be tested for STD Got his foreskin caught in his zipper an now has a sore that wont heal. Patient Active Problem List   Diagnosis Date Noted   Primary hypertension 09/14/2021   Somatic dysfunction of spine, sacral 07/30/2021   Hypermobility syndrome 06/06/2021   Recurrent major depressive disorder, in partial remission (HCC) 01/28/2017   Hypogonadism in male 01/28/2017   Attention deficit hyperactivity disorder (ADHD), combined type 08/03/2014   Gynecomastia, male - bilateral 04/17/2012       Review of Systems  Constitutional:  Negative for diaphoresis.  Eyes:  Negative for pain.  Respiratory:  Negative for shortness of breath.   Cardiovascular:  Negative for chest pain, palpitations and leg swelling.  Gastrointestinal:  Negative for abdominal pain.  Endocrine: Negative for polydipsia.  Skin:  Negative for rash.  Neurological:  Negative for dizziness, weakness and headaches.  Hematological:  Does not bruise/bleed easily.  All other systems reviewed and are negative.      Objective:   Physical Exam Vitals and nursing note reviewed.  Constitutional:      Appearance: Normal appearance. He is well-developed.  HENT:     Head: Normocephalic.     Nose: Nose normal.     Mouth/Throat:     Mouth: Mucous membranes are moist.     Pharynx: Oropharynx is clear.  Eyes:     Pupils: Pupils are equal, round, and reactive to light.  Neck:     Thyroid: No thyroid mass or thyromegaly.     Vascular: No carotid bruit or JVD.     Trachea: Phonation  normal.  Cardiovascular:     Rate and Rhythm: Normal rate and regular rhythm.  Pulmonary:     Effort: Pulmonary effort is normal. No respiratory distress.     Breath sounds: Normal breath sounds.  Abdominal:     General: Bowel sounds are normal.     Palpations: Abdomen is soft.     Tenderness: There is no abdominal tenderness.  Genitourinary:    Comments: Foreskin adhesion Musculoskeletal:        General: Normal range of motion.     Cervical back: Normal range of motion and neck supple.  Lymphadenopathy:     Cervical: No cervical adenopathy.  Skin:    General: Skin is warm and dry.  Neurological:     Mental Status: He is alert and oriented to person, place, and time.  Psychiatric:        Behavior: Behavior normal.        Thought Content: Thought content normal.        Judgment: Judgment normal.     BP (!) 147/101   Pulse 67   Temp 98 F (36.7 C) (Temporal)   Resp 20   Ht 5\' 8"  (1.727 m)   Wt 193 lb (87.5 kg)   SpO2 96%   BMI 29.35 kg/m        Assessment & Plan:   [place] in today with chief  complaint of Discuss meds (Wants to increase adderrall/)   1. Screening for STD (sexually transmitted disease) Labs pending - HepB+HepC+HIV Panel - Ct Ng M genitalium NAA, Urine  2. Foreskin adhesions Avoid pressure on area - Ambulatory referral to Urology  3. Attention deficit hyperactivity disorder (ADHD), combined type Stress management On max dose of adderall so will remain on current dose - amphetamine-dextroamphetamine (ADDERALL XR) 30 MG 24 hr capsule; Take 1 capsule (30 mg total) by mouth every morning.  Dispense: 30 capsule; Refill: 0 - amphetamine-dextroamphetamine (ADDERALL XR) 30 MG 24 hr capsule; Take 1 capsule (30 mg total) by mouth every morning.  Dispense: 30 capsule; Refill: 0 - amphetamine-dextroamphetamine (ADDERALL XR) 30 MG 24 hr capsule; Take 1 capsule (30 mg total) by mouth every morning.  Dispense: 30 capsule; Refill: 0    The above  assessment and management plan was discussed with the patient. The patient verbalized understanding of and has agreed to the management plan. Patient is aware to call the clinic if symptoms persist or worsen. Patient is aware when to return to the clinic for a follow-up visit. Patient educated on when it is appropriate to go to the emergency department.   Mary-Margaret Daphine Deutscher, FNP

## 2022-09-25 ENCOUNTER — Other Ambulatory Visit: Payer: Self-pay

## 2022-09-25 DIAGNOSIS — R5381 Other malaise: Secondary | ICD-10-CM

## 2022-09-25 LAB — HEPB+HEPC+HIV PANEL
HIV Screen 4th Generation wRfx: NONREACTIVE
Hep B C IgM: NEGATIVE
Hep B Core Total Ab: NEGATIVE
Hep B E Ab: NONREACTIVE
Hep B E Ag: NEGATIVE
Hep B Surface Ab, Qual: REACTIVE
Hep C Virus Ab: NONREACTIVE
Hepatitis B Surface Ag: NEGATIVE

## 2022-09-26 LAB — CT NG M GENITALIUM NAA, URINE
Chlamydia trachomatis, NAA: NEGATIVE
Mycoplasma genitalium NAA: NEGATIVE
Neisseria gonorrhoeae, NAA: NEGATIVE

## 2022-10-14 ENCOUNTER — Telehealth: Payer: 59 | Admitting: Family Medicine

## 2022-10-14 ENCOUNTER — Encounter: Payer: Self-pay | Admitting: Family Medicine

## 2022-10-14 DIAGNOSIS — U071 COVID-19: Secondary | ICD-10-CM

## 2022-10-14 MED ORDER — PROMETHAZINE-DM 6.25-15 MG/5ML PO SYRP: 2.5000 mL | ORAL_SOLUTION | Freq: Four times a day (QID) | ORAL | 0 refills | Status: DC | PRN

## 2022-10-14 MED ORDER — BENZONATATE 100 MG PO CAPS: 100.0000 mg | ORAL_CAPSULE | Freq: Three times a day (TID) | ORAL | 0 refills | Status: DC | PRN

## 2022-10-14 MED ORDER — NIRMATRELVIR/RITONAVIR (PAXLOVID)TABLET
3.0000 | ORAL_TABLET | Freq: Two times a day (BID) | ORAL | 0 refills | Status: AC
Start: 2022-10-14 — End: 2022-10-19

## 2022-10-14 NOTE — Patient Instructions (Signed)

## 2022-10-14 NOTE — Progress Notes (Signed)
MyChart Video visit  Subjective: ON:GEXBM PCP: Bennie Pierini, FNP WUX:LKGMWN D Straughan is a 32 y.o. male. Patient provides verbal consent for consult held via video.  Due to COVID-19 pandemic this visit was conducted virtually. This visit type was conducted due to national recommendations for restrictions regarding the COVID-19 Pandemic (e.g. social distancing, sheltering in place) in an effort to limit this patient's exposure and mitigate transmission in our community. All issues noted in this document were discussed and addressed.  A physical exam was not performed with this format.   Location of patient: home Location of provider: [facility] Others present for call: none  1. COVID Patient reports onset of headache and arthralgia on Thursday.  He tested at home and it was negative.  He woke up with sore throat, nasal congestion/ drainage.  He took another test and was positive.  He reports no hemoptysis, fevers, shortness of breath or wheezing.  Not taking any medications yet for the symptoms   ROS: Per HPI  Allergies  Allergen Reactions   Indomethacin     Brain fog, balance issues, cognitive dissonance.    Lamictal [Lamotrigine] Other (See Comments)    "Muscle Spasms in back"   Peanut-Containing Drug Products     Specifically [place] nuts   Past Medical History:  Diagnosis Date   ADD (attention deficit disorder) f   Allergic rhinitis    Bipolar depression (HCC)    Gout    Gynecomastia, male    Hypermobility of joint    Raynauds syndrome     Current Outpatient Medications:    allopurinol (ZYLOPRIM) 300 MG tablet, Take 1 tablet (300 mg total) by mouth daily., Disp: 90 tablet, Rfl: 0   [START ON 11/23/2022] amphetamine-dextroamphetamine (ADDERALL XR) 30 MG 24 hr capsule, Take 1 capsule (30 mg total) by mouth every morning., Disp: 30 capsule, Rfl: 0   [START ON 10/24/2022] amphetamine-dextroamphetamine (ADDERALL XR) 30 MG 24 hr capsule, Take 1 capsule (30 mg total) by mouth  every morning., Disp: 30 capsule, Rfl: 0   amphetamine-dextroamphetamine (ADDERALL XR) 30 MG 24 hr capsule, Take 1 capsule (30 mg total) by mouth every morning., Disp: 30 capsule, Rfl: 0   ARIPiprazole (ABILIFY) 10 MG tablet, Take 1 tablet (10 mg total) by mouth daily., Disp: 90 tablet, Rfl: 1   citalopram (CELEXA) 40 MG tablet, Take 1 tablet (40 mg total) by mouth daily., Disp: 90 tablet, Rfl: 1   colchicine 0.6 MG tablet, Take 0.6mg  po qd, increase to BID for flares., Disp: 60 tablet, Rfl: 3   diphenhydrAMINE (BENADRYL) 25 MG tablet, Take 1 tablet (25 mg total) by mouth every 6 (six) hours as needed for itching., Disp: , Rfl:    EPINEPHrine 0.3 mg/0.3 mL IJ SOAJ injection, Inject 0.3 mLs (0.3 mg total) into the muscle once as needed for up to 1 dose (Tongue or throat swelling)., Disp: 1 Device, Rfl: 1   hydrochlorothiazide (HYDRODIURIL) 25 MG tablet, Take 1 tablet (25 mg total) by mouth daily., Disp: 90 tablet, Rfl: 1   testosterone cypionate (DEPOTESTOSTERONE CYPIONATE) 200 MG/ML injection, Inject 0.5 mLs (100 mg total) into the muscle every 14 (fourteen) days., Disp: 10 mL, Rfl: 1   valACYclovir (VALTREX) 1000 MG tablet, Take 1 tablet (1,000 mg total) by mouth 2 (two) times daily as needed. Prn as needed for fever blisters, Disp: 30 tablet, Rfl: 1  Gen: Nontoxic male. Pulm: Normal work of breathing on room air.  No appreciable wheezes.  No dyspnea with speech  Assessment/ Plan:  32 y.o. male   COVID - Plan: nirmatrelvir/ritonavir (PAXLOVID) 20 x 150 MG & 10 x 100MG  TABS, benzonatate (TESSALON PERLES) 100 MG capsule, promethazine-dextromethorphan (PROMETHAZINE-DM) 6.25-15 MG/5ML syrup  Home test positive for COVID.  Within the 5-day start so Paxlovid sent.  Discussed reducing Abilify.  Do not see any drug interactions with the remainder of his meds.  Tessalon Perles and promethazine sent for cough.  Caution sedation.  Hydrate.  We discussed red flag signs and symptoms warranting further  evaluation and he voiced good understanding.  Follow-up as needed  Start time: 11:53a End time: 11:59a  Total time spent on patient care (including video visit/ documentation): 8 minutes  Lesleyann Fichter Hulen Skains, DO [facility] [phone]

## 2022-10-28 ENCOUNTER — Ambulatory Visit: Payer: 59 | Admitting: Nurse Practitioner

## 2022-12-13 ENCOUNTER — Ambulatory Visit: Payer: 59 | Admitting: Nurse Practitioner

## 2022-12-13 ENCOUNTER — Encounter: Payer: Self-pay | Admitting: Nurse Practitioner

## 2022-12-13 VITALS — BP 130/85 | HR 93 | Temp 98.2°F | Resp 20 | Ht 68.0 in | Wt 191.0 lb

## 2022-12-13 DIAGNOSIS — F902 Attention-deficit hyperactivity disorder, combined type: Secondary | ICD-10-CM

## 2022-12-13 DIAGNOSIS — F3341 Major depressive disorder, recurrent, in partial remission: Secondary | ICD-10-CM | POA: Diagnosis not present

## 2022-12-13 DIAGNOSIS — I10 Essential (primary) hypertension: Secondary | ICD-10-CM

## 2022-12-13 MED ORDER — AMPHETAMINE-DEXTROAMPHET ER 30 MG PO CP24
30.0000 mg | ORAL_CAPSULE | ORAL | 0 refills | Status: DC
Start: 2023-01-21 — End: 2023-03-21

## 2022-12-13 MED ORDER — AMPHETAMINE-DEXTROAMPHET ER 30 MG PO CP24
30.0000 mg | ORAL_CAPSULE | ORAL | 0 refills | Status: DC
Start: 2023-02-20 — End: 2023-03-21

## 2022-12-13 MED ORDER — AMPHETAMINE-DEXTROAMPHET ER 30 MG PO CP24
30.0000 mg | ORAL_CAPSULE | ORAL | 0 refills | Status: DC
Start: 2022-12-22 — End: 2023-03-21

## 2022-12-13 NOTE — Progress Notes (Signed)
Subjective:    Patient ID: Charles [name], male    DOB: [birth year], 32 y.o.   MRN: [id number]   Chief Complaint: ADHD    HPI:  Charles [name] is a 32 y.o. who identifies as a male who was assigned male at birth.   Social history: Lives with: by himself Work history: [employer]   Comes in today for follow up of the following chronic medical issues:  1. Primary hypertension No c/o chest pain, sob or headache. Doe snot check blood pressure at home. He has not been taking his blood pressure meds the last week  or so no reason why, other than just forgot BP Readings from Last 3 Encounters:  12/13/22 130/85  09/24/22 (!) 147/101  07/25/22 132/84     2. Attention deficit hyperactivity disorder (ADHD), combined type Has been on adhd meds since middle school. Is not able to relax and calm down without meds. Denies any medication side effects.  3. Recurrent major depressive disorder, in partial remission (HCC) He has not taken his abilify or celexa the last week  or so.    12/13/2022    4:14 PM 09/24/2022    4:21 PM 07/25/2022   12:18 PM  Depression screen PHQ 2/9  Decreased Interest 1 1 1   Down, Depressed, Hopeless 1 0 1  PHQ - 2 Score 2 1 2   Altered sleeping 0 1 1  Tired, decreased energy 1 1 0  Change in appetite 1 0 0  Feeling bad or failure about yourself  0 0 1  Trouble concentrating 0 0 0  Moving slowly or fidgety/restless 0 0 0  Suicidal thoughts 0 0 0  PHQ-9 Score 4 3 4   Difficult doing work/chores Somewhat difficult Not difficult at all Not difficult at all      12/13/2022    4:15 PM 09/24/2022    4:21 PM 07/25/2022   12:18 PM 05/07/2022   12:06 PM  GAD 7 : Generalized Anxiety Score  Nervous, Anxious, on Edge 0 0 0 0  Control/stop worrying 0 0 0 0  Worry too much - different things 0 0 0 0  Trouble relaxing 1 1 0 0  Restless 0 0 0 0  Easily annoyed or irritable 1 0 0 0  Afraid - awful might happen 0 0 0 0  Total GAD 7 Score 2 1 0 0  Anxiety Difficulty Not  difficult at all Not difficult at all Not difficult at all Not difficult at all        New complaints: None today  Allergies  Allergen Reactions   Indomethacin     Brain fog, balance issues, cognitive dissonance.    Lamictal [Lamotrigine] Other (See Comments)    "Muscle Spasms in back"   Peanut-Containing Drug Products     Specifically [place] nuts   Outpatient Encounter Medications as of 12/13/2022  Medication Sig   amphetamine-dextroamphetamine (ADDERALL XR) 30 MG 24 hr capsule Take 1 capsule (30 mg total) by mouth every morning.   allopurinol (ZYLOPRIM) 300 MG tablet Take 1 tablet (300 mg total) by mouth daily. (Patient not taking: Reported on 12/13/2022)   amphetamine-dextroamphetamine (ADDERALL XR) 30 MG 24 hr capsule Take 1 capsule (30 mg total) by mouth every morning.   amphetamine-dextroamphetamine (ADDERALL XR) 30 MG 24 hr capsule Take 1 capsule (30 mg total) by mouth every morning.   ARIPiprazole (ABILIFY) 10 MG tablet Take 1 tablet (10 mg total) by mouth daily. (Patient not taking: Reported on 12/13/2022)  citalopram (CELEXA) 40 MG tablet Take 1 tablet (40 mg total) by mouth daily. (Patient not taking: Reported on 12/13/2022)   colchicine 0.6 MG tablet Take 0.6mg  po qd, increase to BID for flares. (Patient not taking: Reported on 12/13/2022)   diphenhydrAMINE (BENADRYL) 25 MG tablet Take 1 tablet (25 mg total) by mouth every 6 (six) hours as needed for itching. (Patient not taking: Reported on 12/13/2022)   EPINEPHrine 0.3 mg/0.3 mL IJ SOAJ injection Inject 0.3 mLs (0.3 mg total) into the muscle once as needed for up to 1 dose (Tongue or throat swelling). (Patient not taking: Reported on 12/13/2022)   hydrochlorothiazide (HYDRODIURIL) 25 MG tablet Take 1 tablet (25 mg total) by mouth daily. (Patient not taking: Reported on 12/13/2022)   testosterone cypionate (DEPOTESTOSTERONE CYPIONATE) 200 MG/ML injection Inject 0.5 mLs (100 mg total) into the muscle every 14 (fourteen) days.  (Patient not taking: Reported on 12/13/2022)   valACYclovir (VALTREX) 1000 MG tablet Take 1 tablet (1,000 mg total) by mouth 2 (two) times daily as needed. Prn as needed for fever blisters (Patient not taking: Reported on 12/13/2022)   [DISCONTINUED] benzonatate (TESSALON PERLES) 100 MG capsule Take 1 capsule (100 mg total) by mouth 3 (three) times daily as needed for cough.   [DISCONTINUED] promethazine-dextromethorphan (PROMETHAZINE-DM) 6.25-15 MG/5ML syrup Take 2.5 mLs by mouth 4 (four) times daily as needed.   No facility-administered encounter medications on file as of 12/13/2022.    Past Surgical History:  Procedure Laterality Date   KNEE ARTHROSCOPY W/ MENISCECTOMY  11/23/2005   left knee - extensive bucket handle tear of medial meniscus   SHOULDER ARTHROSCOPY W/ BANKART PROCEDURE Right 08/10/2006   TONSILLECTOMY  09/16/1995   WISDOM TOOTH EXTRACTION      Family History  Problem Relation Age of Onset   Depression Mother    Heart Problems Father    Hypertension Father    Healthy Sister    Cancer Maternal Aunt        breast   Heart attack Maternal Grandfather    Diabetes Paternal Grandmother    Diabetes Paternal Grandfather    Hyperlipidemia Paternal Grandfather    Hypertension Paternal Grandfather    Heart disease Paternal Grandfather       Controlled substance contract: n/a     Review of Systems  Constitutional:  Negative for diaphoresis.  Eyes:  Negative for pain.  Respiratory:  Negative for shortness of breath.   Cardiovascular:  Negative for chest pain, palpitations and leg swelling.  Gastrointestinal:  Negative for abdominal pain.  Endocrine: Negative for polydipsia.  Skin:  Negative for rash.  Neurological:  Negative for dizziness, weakness and headaches.  Hematological:  Does not bruise/bleed easily.  All other systems reviewed and are negative.      Objective:   Physical Exam Constitutional:      Appearance: Normal appearance.  Cardiovascular:      Rate and Rhythm: Normal rate and regular rhythm.     Heart sounds: Normal heart sounds.  Pulmonary:     Effort: Pulmonary effort is normal.     Breath sounds: Normal breath sounds.  Skin:    General: Skin is warm.  Neurological:     General: No focal deficit present.     Mental Status: He is alert and oriented to person, place, and time.  Psychiatric:        Mood and Affect: Mood normal.        Behavior: Behavior normal.     BP 130/85  Pulse 93   Temp 98.2 F (36.8 C) (Temporal)   Resp 20   Ht 5\' 8"  (1.727 m)   Wt 191 lb (86.6 kg)   SpO2 97%   BMI 29.04 kg/m        Assessment & Plan:  [facility] comes in today with chief complaint of ADHD   Diagnosis and orders addressed:  1. Primary hypertension Back on meds Low sodium diet  2. Attention deficit hyperactivity disorder (ADHD), combined type Stress management - amphetamine-dextroamphetamine (ADDERALL XR) 30 MG 24 hr capsule; Take 1 capsule (30 mg total) by mouth every morning.  Dispense: 30 capsule; Refill: 0 - amphetamine-dextroamphetamine (ADDERALL XR) 30 MG 24 hr capsule; Take 1 capsule (30 mg total) by mouth every morning.  Dispense: 30 capsule; Refill: 0 - amphetamine-dextroamphetamine (ADDERALL XR) 30 MG 24 hr capsule; Take 1 capsule (30 mg total) by mouth every morning.  Dispense: 30 capsule; Refill: 0  3. Recurrent major depressive disorder, in partial remission [facility]) Stress management-  Back on meds- may take a few days to get back in system May have some side effects when starting back   Labs pending Health Maintenance reviewed Diet and exercise encouraged  Follow up plan: 3 months   Mary-Margaret Daphine Deutscher, FNP

## 2022-12-16 MED ORDER — FLUOXETINE HCL 40 MG PO CAPS: 40.0000 mg | ORAL_CAPSULE | Freq: Every day | ORAL | 1 refills | Status: DC

## 2022-12-16 NOTE — Telephone Encounter (Signed)
Please ask him about suicidal thoughts before we start prozac

## 2022-12-16 NOTE — Telephone Encounter (Signed)
Will start on prozac- stop celexa- can continue the abilify as prescribed.  Meds ordered this encounter  Medications   FLUoxetine (PROZAC) 40 MG capsule    Sig: Take 1 capsule (40 mg total) by mouth daily.    Dispense:  90 capsule    Refill:  1    Order Specific Question:   Supervising Provider    Answer:   Nils Pyle [[id number]]   Mary-Margaret Daphine Deutscher, FNP

## 2022-12-16 NOTE — Telephone Encounter (Signed)
Please let patient know what my note says

## 2022-12-17 NOTE — Telephone Encounter (Signed)
Needs counseling for this

## 2023-01-03 NOTE — Progress Notes (Signed)
Office Visit Note  Patient: Charles [name]             Date of Birth: Jun 17, 1990           MRN: [id number]             PCP: Bennie Pierini, FNP Referring: Bennie Pierini, * Visit Date: 01/16/2023 Occupation: @GUAROCC @  Subjective:  Medication management  History of Present Illness: Charles [name] is a 31 y.o. male with gout.  Patient states he had a mild discomfort in his right knee few months back and he thought it was a gout flare.  He took colchicine and the symptoms resolved.  He states he has been taking allopurinol 300 mg most of the days.  He sometimes forgets to take the allopurinol.  He takes colchicine only on as needed basis.  He has been drinking beer 2 to 3 glasses every night.  He has been avoiding red meat and shrimp.  He also switched from chewing tobacco to nicotine pouches.  None of the joints are painful today.    Activities of Daily Living:  Patient reports morning stiffness for 10 minutes.   Patient Denies nocturnal pain.  Difficulty dressing/grooming: Denies Difficulty climbing stairs: Denies Difficulty getting out of chair: Denies Difficulty using hands for taps, buttons, cutlery, and/or writing: Denies  Review of Systems  Constitutional:  Negative for fatigue.  HENT:  Negative for mouth sores and mouth dryness.   Eyes:  Negative for dryness.  Respiratory:  Negative for shortness of breath.   Cardiovascular:  Negative for chest pain and palpitations.  Gastrointestinal:  Negative for blood in stool, constipation and diarrhea.  Endocrine: Negative for increased urination.  Genitourinary:  Negative for involuntary urination.  Musculoskeletal:  Positive for joint pain, joint pain and morning stiffness. Negative for gait problem, joint swelling, myalgias, muscle weakness, muscle tenderness and myalgias.  Skin:  Negative for color change, rash, hair loss and sensitivity to sunlight.  Allergic/Immunologic: Negative for susceptible to infections.   Neurological:  Negative for dizziness and headaches.  Hematological:  Negative for swollen glands.  Psychiatric/Behavioral:  Positive for depressed mood. Negative for sleep disturbance. The patient is nervous/anxious.     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 04/17/2012    Past Medical History:  Diagnosis Date   ADD (attention deficit disorder) f   Allergic rhinitis    Bipolar depression (HCC)    Gout    Gynecomastia, male    Hypermobility of joint    Raynauds syndrome     Family History  Problem Relation Age of Onset   Depression Mother    Heart Problems Father    Hypertension Father    Healthy Sister    Cancer Maternal Aunt        breast   Heart attack Maternal Grandfather    Diabetes Paternal Grandmother    Diabetes Paternal Grandfather    Hyperlipidemia Paternal Grandfather    Hypertension Paternal Grandfather    Heart disease Paternal Grandfather    Past Surgical History:  Procedure Laterality Date   KNEE ARTHROSCOPY W/ MENISCECTOMY  11/23/2005   left knee - extensive bucket handle tear of medial  meniscus   SHOULDER ARTHROSCOPY W/ BANKART PROCEDURE Right 08/10/2006   TONSILLECTOMY  09/16/1995   WISDOM TOOTH EXTRACTION     Social History   Social History Narrative   Not on file   Immunization History  Administered Date(s) Administered   DTaP 02/21/1989, 05/02/1989, 07/11/1989, 06/30/1990, 07/25/1993   HIB (PRP-OMP) 02/21/1989, 05/02/1989, 07/11/1989, 06/30/1990   Hepatitis A 06/17/2005, 12/27/2005   Hepatitis B 07/28/1992, 09/21/1992, 02/07/1993   IPV 02/21/1989, 05/02/1989, 06/30/1990,  07/25/1993   Influenza,Quad,Nasal, Live 12/31/2012, 02/07/2014, 01/22/2018   Influenza,inj,Quad PF,6+ Mos 12/23/2014, 01/28/2017, 01/25/2019   MMR 04/09/1990, 07/25/1993   Meningococcal Conjugate 12/27/2005   Tdap 12/22/2006, 01/14/2023     Objective: Vital Signs: BP (!) 156/93 (BP Location: Left Arm, Patient Position: Sitting, Cuff Size: Normal)   Pulse 99   Resp 14   Ht 5\' 8"  (1.727 m)   Wt 187 lb (84.8 kg)   BMI 28.43 kg/m    Physical Exam Vitals and nursing note reviewed.  Constitutional:      Appearance: He is well-developed.  HENT:     Head: Normocephalic and atraumatic.  Eyes:     Conjunctiva/sclera: Conjunctivae normal.     Pupils: Pupils are equal, round, and reactive to light.  Cardiovascular:     Rate and Rhythm: Normal rate and regular rhythm.     Heart sounds: Normal heart sounds.  Pulmonary:     Effort: Pulmonary effort is normal.     Breath sounds: Normal breath sounds.  Abdominal:     General: Bowel sounds are normal.     Palpations: Abdomen is soft.  Musculoskeletal:     Cervical back: Normal range of motion and neck supple.  Skin:    General: Skin is warm and dry.     Capillary Refill: Capillary refill takes less than 2 seconds.  Neurological:     Mental Status: He is alert and oriented to person, place, and time.  Psychiatric:        Behavior: Behavior normal.      Musculoskeletal Exam: Equal, thoracic and lumbar spine 1 good range of motion.  Shoulder joints, elbow joints, wrist joints, MCPs PIPs and DIPs with good range of motion with no synovitis.  Hip joints, knee joints in good range of motion.  No warmth swelling or effusion was noted.  There was no tenderness over ankles or MTPs.  CDAI Exam: CDAI Score: -- Patient Global: --; Provider Global: -- Swollen: --; Tender: -- Joint Exam 01/16/2023   No joint exam has been documented for this visit   There is currently no information documented on the homunculus. Go to the Rheumatology  activity and complete the homunculus joint exam.  Investigation: No additional findings.  Imaging: No results found.  Recent Labs: Lab Results  Component Value Date   WBC 7.7 07/12/2022   HGB 17.8 (H) 07/12/2022   PLT 289 07/12/2022   NA 140 07/12/2022   K 3.9 07/12/2022   CL 102 07/12/2022   CO2 28 07/12/2022   GLUCOSE 105 (H) 07/12/2022   BUN 14 07/12/2022   CREATININE 1.04 07/12/2022   BILITOT 0.5 07/12/2022   ALKPHOS 72 09/28/2021   AST 33 07/12/2022   ALT 77 (H) 07/12/2022   PROT 6.6 07/12/2022   ALBUMIN 5.0 09/28/2021   CALCIUM 9.5 07/12/2022    Speciality Comments: No specialty comments available.  Procedures:  No procedures performed Allergies: Indomethacin, Lamictal [lamotrigine], and Peanut-containing drug products   Assessment / Plan:     Visit Diagnoses: Idiopathic  chronic gout of right ankle without tophus -patient states he has not been taking allopurinol on a regular basis as he misses some doses.  He takes colchicine only on as needed basis.  He had 1 episode of discomfort in his knee joint which resolved after taking colchicine.  No joint swelling was noted on the examination today.  He is on allopurinol 300 mg daily and colchicine 0.6 mg 1 tablet daily as needed. uric acid: 6.3 on 07/12/2022.  Medication monitoring encounter-we will check CBC with differential and CMP with GFR today.  Raynaud's disease without gangrene -he denies any symptoms today.  All autoimmune work-up had been negative.  Hypermobility of joint - he has had osteopathic manipulation by Dr. Katrinka Blazing in the past.  Elevated LFTs -his LFTs remain elevated.  Patient states he has been drinking beer 2 to 3 glasses every night.  Alcohol use-2 to 3 glasses of beer nightly.  He was advised to cut back on alcohol intake due to elevated LFTs and also tendency to make gout worse.  Elevated hemoglobin (HCC)-his hemoglobin remains high.  Patient states is followed by his PCP.  Other medical  problems are listed as follows:  Elevated blood pressure reading-blood pressure was elevated at 150/90.  Repeat blood pressure was 156/93.  He was advised to monitor blood pressure closely and follow-up with his PCP.  History of bulimia  Chews tobacco-he switched to nicotine pouch.  Attention deficit hyperactivity disorder (ADHD), combined type-she takes Adderall.  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  Recurrent major depressive disorder, in partial remission (HCC)-he is on Prozac.  Orders: Orders Placed This Encounter  Procedures   CBC with Differential/Platelet   COMPLETE METABOLIC PANEL WITH GFR   Uric acid   No orders of the defined types were placed in this encounter.    Follow-Up Instructions: Return in about 6 months (around 07/16/2023) for Gout.   Pollyann Savoy, MD  Note - This record has been created using Animal nutritionist.  Chart creation errors have been sought, but may not always  have been located. Such creation errors do not reflect on  the standard of medical care.

## 2023-01-14 ENCOUNTER — Ambulatory Visit (INDEPENDENT_AMBULATORY_CARE_PROVIDER_SITE_OTHER): Payer: 59

## 2023-01-14 DIAGNOSIS — Z23 Encounter for immunization: Secondary | ICD-10-CM

## 2023-01-16 ENCOUNTER — Encounter: Payer: Self-pay | Admitting: Rheumatology

## 2023-01-16 ENCOUNTER — Ambulatory Visit: Payer: 59 | Attending: Rheumatology | Admitting: Rheumatology

## 2023-01-16 VITALS — BP 156/93 | HR 99 | Resp 14 | Ht 68.0 in | Wt 187.0 lb

## 2023-01-16 DIAGNOSIS — Z5181 Encounter for therapeutic drug level monitoring: Secondary | ICD-10-CM | POA: Diagnosis not present

## 2023-01-16 DIAGNOSIS — I7300 Raynaud's syndrome without gangrene: Secondary | ICD-10-CM | POA: Diagnosis not present

## 2023-01-16 DIAGNOSIS — M249 Joint derangement, unspecified: Secondary | ICD-10-CM

## 2023-01-16 DIAGNOSIS — E291 Testicular hypofunction: Secondary | ICD-10-CM

## 2023-01-16 DIAGNOSIS — N62 Hypertrophy of breast: Secondary | ICD-10-CM

## 2023-01-16 DIAGNOSIS — M1A0710 Idiopathic chronic gout, right ankle and foot, without tophus (tophi): Secondary | ICD-10-CM

## 2023-01-16 DIAGNOSIS — D582 Other hemoglobinopathies: Secondary | ICD-10-CM

## 2023-01-16 DIAGNOSIS — Z789 Other specified health status: Secondary | ICD-10-CM

## 2023-01-16 DIAGNOSIS — Z8659 Personal history of other mental and behavioral disorders: Secondary | ICD-10-CM

## 2023-01-16 DIAGNOSIS — R030 Elevated blood-pressure reading, without diagnosis of hypertension: Secondary | ICD-10-CM

## 2023-01-16 DIAGNOSIS — R7989 Other specified abnormal findings of blood chemistry: Secondary | ICD-10-CM

## 2023-01-16 DIAGNOSIS — Z87898 Personal history of other specified conditions: Secondary | ICD-10-CM

## 2023-01-16 DIAGNOSIS — F3341 Major depressive disorder, recurrent, in partial remission: Secondary | ICD-10-CM

## 2023-01-16 DIAGNOSIS — Z720 Tobacco use: Secondary | ICD-10-CM

## 2023-01-16 DIAGNOSIS — F902 Attention-deficit hyperactivity disorder, combined type: Secondary | ICD-10-CM

## 2023-01-16 NOTE — Progress Notes (Signed)
Hemoglobin is high at 20.0.  Please refer patient to hematology for the evaluation of polycythemia.

## 2023-01-17 ENCOUNTER — Telehealth: Payer: Self-pay | Admitting: *Deleted

## 2023-01-17 DIAGNOSIS — D751 Secondary polycythemia: Secondary | ICD-10-CM

## 2023-01-17 LAB — CBC WITH DIFFERENTIAL/PLATELET
Absolute Lymphocytes: 1986 {cells}/uL (ref 850–3900)
Absolute Monocytes: 569 {cells}/uL (ref 200–950)
Basophils Absolute: 51 {cells}/uL (ref 0–200)
Basophils Relative: 0.7 %
Eosinophils Absolute: 139 {cells}/uL (ref 15–500)
Eosinophils Relative: 1.9 %
HCT: 56.1 % — ABNORMAL HIGH (ref 38.5–50.0)
Hemoglobin: 20 g/dL — ABNORMAL HIGH (ref 13.2–17.1)
MCH: 31.7 pg (ref 27.0–33.0)
MCHC: 35.7 g/dL (ref 32.0–36.0)
MCV: 88.9 fL (ref 80.0–100.0)
MPV: 10.1 fL (ref 7.5–12.5)
Monocytes Relative: 7.8 %
Neutro Abs: 4555 {cells}/uL (ref 1500–7800)
Neutrophils Relative %: 62.4 %
Platelets: 360 10*3/uL (ref 140–400)
RBC: 6.31 10*6/uL — ABNORMAL HIGH (ref 4.20–5.80)
RDW: 12.4 % (ref 11.0–15.0)
Total Lymphocyte: 27.2 %
WBC: 7.3 10*3/uL (ref 3.8–10.8)

## 2023-01-17 LAB — COMPLETE METABOLIC PANEL WITH GFR
AG Ratio: 1.9 (calc) (ref 1.0–2.5)
ALT: 52 U/L — ABNORMAL HIGH (ref 9–46)
AST: 30 U/L (ref 10–40)
Albumin: 4.9 g/dL (ref 3.6–5.1)
Alkaline phosphatase (APISO): 83 U/L (ref 36–130)
BUN: 11 mg/dL (ref 7–25)
CO2: 29 mmol/L (ref 20–32)
Calcium: 10.1 mg/dL (ref 8.6–10.3)
Chloride: 96 mmol/L — ABNORMAL LOW (ref 98–110)
Creat: 1.09 mg/dL (ref 0.60–1.26)
Globulin: 2.6 g/dL (ref 1.9–3.7)
Glucose, Bld: 106 mg/dL — ABNORMAL HIGH (ref 65–99)
Potassium: 4 mmol/L (ref 3.5–5.3)
Sodium: 136 mmol/L (ref 135–146)
Total Bilirubin: 1.2 mg/dL (ref 0.2–1.2)
Total Protein: 7.5 g/dL (ref 6.1–8.1)
eGFR: 91 mL/min/{1.73_m2} (ref >=60)

## 2023-01-17 LAB — URIC ACID: Uric Acid, Serum: 8.6 mg/dL — ABNORMAL HIGH (ref 4.0–8.0)

## 2023-01-17 NOTE — Telephone Encounter (Signed)
-----   Message from [facility] sent at 01/17/2023  8:00 AM EST ----- Liver function are elevated.  Patient is consuming alcohol.  Please advise abstinence from alcohol.  He is also not taking allopurinol on a regular basis.  He should take allopurinol on a regular basis.  Uric acid is elevated at 8.6.

## 2023-01-17 NOTE — Progress Notes (Signed)
Liver function are elevated.  Patient is consuming alcohol.  Please advise abstinence from alcohol.  He is also not taking allopurinol on a regular basis.  He should take allopurinol on a regular basis.  Uric acid is elevated at 8.6.

## 2023-01-24 ENCOUNTER — Other Ambulatory Visit: Payer: Self-pay | Admitting: Medical Oncology

## 2023-01-24 ENCOUNTER — Telehealth: Payer: Self-pay | Admitting: Medical Oncology

## 2023-01-24 ENCOUNTER — Other Ambulatory Visit: Payer: Self-pay | Admitting: Internal Medicine

## 2023-01-24 DIAGNOSIS — D45 Polycythemia vera: Secondary | ICD-10-CM

## 2023-01-24 NOTE — Telephone Encounter (Signed)
Pt confirmed appt tomorrow

## 2023-01-25 ENCOUNTER — Inpatient Hospital Stay: Payer: 59 | Attending: Internal Medicine | Admitting: Internal Medicine

## 2023-01-25 ENCOUNTER — Inpatient Hospital Stay: Payer: 59

## 2023-01-25 VITALS — BP 136/90 | HR 78 | Temp 98.0°F | Resp 20 | Wt 192.1 lb

## 2023-01-25 DIAGNOSIS — D751 Secondary polycythemia: Secondary | ICD-10-CM | POA: Diagnosis not present

## 2023-01-25 DIAGNOSIS — F1090 Alcohol use, unspecified, uncomplicated: Secondary | ICD-10-CM

## 2023-01-25 DIAGNOSIS — M109 Gout, unspecified: Secondary | ICD-10-CM | POA: Diagnosis not present

## 2023-01-25 DIAGNOSIS — D45 Polycythemia vera: Secondary | ICD-10-CM

## 2023-01-25 DIAGNOSIS — I10 Essential (primary) hypertension: Secondary | ICD-10-CM | POA: Diagnosis not present

## 2023-01-25 DIAGNOSIS — Z803 Family history of malignant neoplasm of breast: Secondary | ICD-10-CM

## 2023-01-25 LAB — CBC WITH DIFFERENTIAL (CANCER CENTER ONLY)
Abs Immature Granulocytes: 0.01 10*3/uL (ref 0.00–0.07)
Basophils Absolute: 0 10*3/uL (ref 0.0–0.1)
Basophils Relative: 1 %
Eosinophils Absolute: 0.2 10*3/uL (ref 0.0–0.5)
Eosinophils Relative: 3 %
HCT: 51.1 % (ref 39.0–52.0)
Hemoglobin: 17.8 g/dL — ABNORMAL HIGH (ref 13.0–17.0)
Immature Granulocytes: 0 %
Lymphocytes Relative: 31 %
Lymphs Abs: 1.9 10*3/uL (ref 0.7–4.0)
MCH: 31.4 pg (ref 26.0–34.0)
MCHC: 34.8 g/dL (ref 30.0–36.0)
MCV: 90.3 fL (ref 80.0–100.0)
Monocytes Absolute: 0.6 10*3/uL (ref 0.1–1.0)
Monocytes Relative: 10 %
Neutro Abs: 3.5 10*3/uL (ref 1.7–7.7)
Neutrophils Relative %: 55 %
Platelet Count: 307 10*3/uL (ref 150–400)
RBC: 5.66 MIL/uL (ref 4.22–5.81)
RDW: 12 % (ref 11.5–15.5)
WBC Count: 6.3 10*3/uL (ref 4.0–10.5)
nRBC: 0 % (ref 0.0–0.2)

## 2023-01-25 LAB — CMP (CANCER CENTER ONLY)
ALT: 87 U/L — ABNORMAL HIGH (ref 0–44)
AST: 35 U/L (ref 15–41)
Albumin: 4.2 g/dL (ref 3.5–5.0)
Alkaline Phosphatase: 62 U/L (ref 38–126)
Anion gap: 3 — ABNORMAL LOW (ref 5–15)
BUN: 10 mg/dL (ref 6–20)
CO2: 29 mmol/L (ref 22–32)
Calcium: 9.1 mg/dL (ref 8.9–10.3)
Chloride: 108 mmol/L (ref 98–111)
Creatinine: 0.99 mg/dL (ref 0.61–1.24)
GFR, Estimated: >60 mL/min
Glucose, Bld: 100 mg/dL — ABNORMAL HIGH (ref 70–99)
Potassium: 3.8 mmol/L (ref 3.5–5.1)
Sodium: 140 mmol/L (ref 135–145)
Total Bilirubin: 0.6 mg/dL
Total Protein: 6.7 g/dL (ref 6.5–8.1)

## 2023-01-25 LAB — IRON AND IRON BINDING CAPACITY (CC-WL,HP ONLY)
Iron: 120 ug/dL (ref 45–182)
Saturation Ratios: 32 % (ref 17.9–39.5)
TIBC: 371 ug/dL (ref 250–450)
UIBC: 251 ug/dL (ref 117–376)

## 2023-01-25 LAB — LACTATE DEHYDROGENASE: LDH: 137 U/L (ref 98–192)

## 2023-01-25 LAB — FERRITIN: Ferritin: 61 ng/mL (ref 24–336)

## 2023-01-25 NOTE — Progress Notes (Signed)
[facility] Telephone:([phone]   [phone]  CONSULT NOTE  REFERRING PHYSICIAN: Mary-Margaret Daphine Deutscher, FNP  REASON FOR CONSULTATION:  32 years old white male with polycythemia  HPI Charles [name] is a 32 y.o. male with past medical history significant for attention deficit disorder, allergic rhinitis, bipolar disorder, gout, male gynecomastia, hypermobility of the joint as well as Raynaud's disease.  The patient was seen recently by his rheumatologist Dr. Corliss Skains for evaluation of his rheumatologic disorder and gout.  He has been on treatment with allopurinol and colchicine in the past.  During a recent evaluation he had repeat CBC on January 16, 2023 and that showed elevated hemoglobin of 20.0 with hematocrit of 56.1%.  He has normal white blood count of 7.3 and normal platelets count of 360,000.  Previous CBC on January 07, 2022 showed similar results with elevated hemoglobin of 17.4 and hematocrit 50.3.  CBC on 07/12/2022 also showed hemoglobin of 17.8 and hematocrit 51.8.  Because of the persistent polycythemia, the patient was referred to me today for evaluation of his condition.  He was accompanied by his mother today. Discussed the use of AI scribe software for clinical note transcription with the patient, who gave verbal consent to proceed.  History of Present Illness   The patient, a 32 year old individual, was referred due to elevated red blood cells, hemoglobin, and hematocrit levels. The patient has a history of gout, diagnosed two years ago following severe ankle pain. Initial misdiagnosis delayed treatment, but once confirmed, the patient was started on colchicine and allopurinol. The patient reported a decrease in gout attacks over the past year, attributing this to medication and dietary changes.  The patient also reported high blood pressure for the past two years, with readings sometimes reaching 150/100. He noted a peculiar symptom of developing  white patches on his hands when held down at his side, which disappeared upon raising his hands. The patient also reported a history of anxiety attacks, one of which was severe enough to be mistaken for a heart attack.  The patient has a history of alcohol consumption, drinking four to six beers a night, five days a week, for several years. However, he recently stopped drinking altogether due to concerns about its contribution to his gout. The patient also reported a history of using snus tobacco, but switched to nicotine pouches about two to three weeks ago in an attempt to quit.  The patient was previously on testosterone therapy due to low levels detected during his time in graduate school. He initially used gels but switched to injections about a year and a half ago. However, he stopped the injections about six months ago due to logistical issues.  The patient's diet is primarily composed of carbohydrates, chicken, and seafood, with red meat consumption limited to once every two weeks. He also reported a history of bulimia, which has improved significantly.  The patient denied any current complaints aside from usual joint stiffness upon waking up. He denied any recent changes in vision, fatigue, weakness, headaches, nausea, vomiting, diarrhea, abdominal pain, chest pain, shortness of breath, or coughing blood. He reported a history of nosebleeds, with the most recent episode occurring about three weeks ago.         Past Medical History:  Diagnosis Date   ADD (attention deficit disorder) f   Allergic rhinitis    Bipolar depression (HCC)    Gout    Gynecomastia, male    Hypermobility of joint  Raynauds syndrome     Past Surgical History:  Procedure Laterality Date   KNEE ARTHROSCOPY W/ MENISCECTOMY  11/23/2005   left knee - extensive bucket handle tear of medial meniscus   SHOULDER ARTHROSCOPY W/ BANKART PROCEDURE Right 08/10/2006   TONSILLECTOMY  09/16/1995   WISDOM TOOTH  EXTRACTION      Family History  Problem Relation Age of Onset   Depression Mother    Heart Problems Father    Hypertension Father    Healthy Sister    Cancer Maternal Aunt        breast   Heart attack Maternal Grandfather    Diabetes Paternal Grandmother    Diabetes Paternal Grandfather    Hyperlipidemia Paternal Grandfather    Hypertension Paternal Grandfather    Heart disease Paternal Grandfather     Social History Social History   Tobacco Use   Smoking status: Never    Passive exposure: Past (minimal)   Smokeless tobacco: Former    Types: Engineer, drilling   Vaping status: Never Used  Substance Use Topics   Alcohol use: Yes    Alcohol/week: 4.0 standard drinks of alcohol    Types: 4 Cans of beer per week   Drug use: No    Comment: CBC/THC gummy    Allergies  Allergen Reactions   Indomethacin     Brain fog, balance issues, cognitive dissonance.    Lamictal [Lamotrigine] Other (See Comments)    "Muscle Spasms in back"   Peanut-Containing Drug Products     Specifically [place] nuts    Current Outpatient Medications  Medication Sig Dispense Refill   allopurinol (ZYLOPRIM) 300 MG tablet Take 1 tablet (300 mg total) by mouth daily. 90 tablet 0   amphetamine-dextroamphetamine (ADDERALL XR) 30 MG 24 hr capsule Take 1 capsule (30 mg total) by mouth every morning. 30 capsule 0   amphetamine-dextroamphetamine (ADDERALL XR) 30 MG 24 hr capsule Take 1 capsule (30 mg total) by mouth every morning. 30 capsule 0   [START ON 02/20/2023] amphetamine-dextroamphetamine (ADDERALL XR) 30 MG 24 hr capsule Take 1 capsule (30 mg total) by mouth every morning. 30 capsule 0   ARIPiprazole (ABILIFY) 10 MG tablet Take 1 tablet (10 mg total) by mouth daily. 90 tablet 1   citalopram (CELEXA) 40 MG tablet Take 1 tablet (40 mg total) by mouth daily. (Patient not taking: Reported on 12/13/2022) 90 tablet 1   colchicine 0.6 MG tablet Take 0.6mg  po qd, increase to BID for flares. 60 tablet 3    diphenhydrAMINE (BENADRYL) 25 MG tablet Take 1 tablet (25 mg total) by mouth every 6 (six) hours as needed for itching.     EPINEPHrine 0.3 mg/0.3 mL IJ SOAJ injection Inject 0.3 mLs (0.3 mg total) into the muscle once as needed for up to 1 dose (Tongue or throat swelling). 1 Device 1   FLUoxetine (PROZAC) 40 MG capsule Take 1 capsule (40 mg total) by mouth daily. 90 capsule 1   hydrochlorothiazide (HYDRODIURIL) 25 MG tablet Take 1 tablet (25 mg total) by mouth daily. 90 tablet 1   naproxen (NAPROSYN) 500 MG tablet Take by mouth.     testosterone cypionate (DEPOTESTOSTERONE CYPIONATE) 200 MG/ML injection Inject 0.5 mLs (100 mg total) into the muscle every 14 (fourteen) days. (Patient not taking: Reported on 12/13/2022) 10 mL 1   valACYclovir (VALTREX) 1000 MG tablet Take 1 tablet (1,000 mg total) by mouth 2 (two) times daily as needed. Prn as needed for fever blisters 30 tablet 1  No current facility-administered medications for this visit.    Review of Systems  Constitutional: negative Eyes: negative Ears, nose, mouth, throat, and face: negative Respiratory: negative Cardiovascular: negative Gastrointestinal: negative Genitourinary:negative Integument/breast: negative Hematologic/lymphatic: negative Musculoskeletal:positive for arthralgias Neurological: negative Behavioral/Psych: negative Endocrine: negative Allergic/Immunologic: negative  Physical Exam  VOZ:DGUYQ, healthy, no distress, well nourished, well developed, and anxious SKIN: skin color, texture, turgor are normal, no rashes or significant lesions HEAD: Normocephalic, No masses, lesions, tenderness or abnormalities EYES: normal, PERRLA, Conjunctiva are pink and non-injected EARS: External ears normal, Canals clear OROPHARYNX:no exudate, no erythema, and lips, buccal mucosa, and tongue normal  NECK: supple, no adenopathy, no JVD LYMPH:  no palpable lymphadenopathy, no hepatosplenomegaly LUNGS: clear to auscultation ,  and palpation HEART: regular rate & rhythm, no murmurs, and no gallops ABDOMEN:abdomen soft, non-tender, normal bowel sounds, and no masses or organomegaly BACK: Back symmetric, no curvature., No CVA tenderness EXTREMITIES:no joint deformities, effusion, or inflammation, no edema  NEURO: alert & oriented x 3 with fluent speech, no focal motor/sensory deficits  PERFORMANCE STATUS: ECOG 0  LABORATORY DATA: Lab Results  Component Value Date   WBC 7.3 01/16/2023   HGB 20.0 (H) 01/16/2023   HCT 56.1 (H) 01/16/2023   MCV 88.9 01/16/2023   PLT 360 01/16/2023      Chemistry      Component Value Date/Time   NA 136 01/16/2023 0819   NA 138 09/28/2021 1608   K 4.0 01/16/2023 0819   CL 96 (L) 01/16/2023 0819   CO2 29 01/16/2023 0819   BUN 11 01/16/2023 0819   BUN 13 09/28/2021 1608   CREATININE 1.09 01/16/2023 0819      Component Value Date/Time   CALCIUM 10.1 01/16/2023 0819   ALKPHOS 72 09/28/2021 1608   AST 30 01/16/2023 0819   ALT 52 (H) 01/16/2023 0819   BILITOT 1.2 01/16/2023 0819   BILITOT 0.8 09/28/2021 1608       RADIOGRAPHIC STUDIES: No results found.  ASSESSMENT AND PLAN:    Polycythemia Elevated red blood cells, hemoglobin, and hematocrit noted on recent blood tests. Hemoglobin was 20.0 and hematocrit 56.1 on January 16, 2023. Current levels are lower but still elevated (hemoglobin 17.8, hematocrit 51.1). Differential diagnosis includes reactive polycythemia versus polycythemia vera. Possible contributing factors include testosterone use, high iron diet, and alcohol consumption. No family history of hemochromatosis. Awaiting further test results including JAK2 mutation and iron levels. Discussed potential need for phlebotomy if polycythemia vera is confirmed. Explained that phlebotomy involves removing blood to reduce red blood cell count and is not suitable for donation if polycythemia vera is present. - Order JAK2 mutation test - Hold hemochromatosis test pending  iron and ferritin levels - Advise reduction of iron-rich foods (red meat, liver) - Avoid iron supplements - Take baby aspirin daily - Schedule follow-up in two months  Gout Gout confirmed by high uric acid crystals. Managed with colchicine and allopurinol. Recent improvement with dietary modifications and reduced alcohol intake. Only one attack in the past year. Discussed continued avoidance of alcohol and maintaining dietary changes to prevent future attacks. - Continue current gout management - Maintain dietary modifications - Continue to avoid alcohol  Hypertension Hypertension for the past two years, with readings around 150/100 mmHg. No recent changes in symptoms or management discussed. - Monitor blood pressure regularly  Alcohol Use Significant alcohol consumption (4-6 beers, 5 days a week). Recently ceased alcohol intake for a week and a half. Discussed the benefits of continued abstinence from alcohol  to improve overall health and reduce risk factors for gout and polycythemia. - Continue to abstain from alcohol  General Health Maintenance None - Advise on reducing high protein and iron-rich foods - Avoid iron supplements  Follow-up - Schedule follow-up appointment in two months - Call with test results if concerning.   The patient was advised to call immediately if he has any concerning symptoms in the interval. The patient voices understanding of current disease status and treatment options and is in agreement with the current care plan.  All questions were answered. The patient knows to call the clinic with any problems, questions or concerns. We can certainly see the patient much sooner if necessary.  Thank you so much for allowing me to participate in the care of Charles [name]. I will continue to follow up the patient with you and assist in his care.  The total time spent in the appointment was 60 minutes.  Disclaimer: This note was dictated with voice recognition  software. Similar sounding words can inadvertently be transcribed and may not be corrected upon review.   Lajuana Matte January 25, 2023, 10:31 AM

## 2023-01-27 LAB — ERYTHROPOIETIN: Erythropoietin: 4.4 m[IU]/mL (ref 2.6–18.5)

## 2023-03-13 ENCOUNTER — Telehealth: Payer: Self-pay

## 2023-03-13 ENCOUNTER — Other Ambulatory Visit: Payer: Self-pay | Admitting: Nurse Practitioner

## 2023-03-13 ENCOUNTER — Other Ambulatory Visit: Payer: Self-pay | Admitting: Physician Assistant

## 2023-03-13 DIAGNOSIS — F3341 Major depressive disorder, recurrent, in partial remission: Secondary | ICD-10-CM

## 2023-03-13 DIAGNOSIS — I10 Essential (primary) hypertension: Secondary | ICD-10-CM

## 2023-03-13 MED ORDER — ARIPIPRAZOLE 10 MG PO TABS: 10.0000 mg | ORAL_TABLET | Freq: Every day | ORAL | 0 refills | Status: DC

## 2023-03-13 NOTE — Telephone Encounter (Signed)
 Received rx request for Abilify

## 2023-03-13 NOTE — Telephone Encounter (Signed)
 Last Fill: 06/10/2022  Labs: 01/25/2023 Hgb 17.8, Glucose 100, ALT 87, Anion gap 3 01/16/2023 Uric acid is elevated at 8.6.   Next Visit: 07/18/2023  Last Visit: 01/16/2023  DX:  Idiopathic chronic gout of right ankle without tophus   Current Dose per office note 01/16/2023: allopurinol  300 mg daily   Okay to refill Allopurinol ?

## 2023-03-21 ENCOUNTER — Encounter: Payer: Self-pay | Admitting: Nurse Practitioner

## 2023-03-21 ENCOUNTER — Telehealth: Payer: 59 | Admitting: Nurse Practitioner

## 2023-03-21 DIAGNOSIS — F902 Attention-deficit hyperactivity disorder, combined type: Secondary | ICD-10-CM

## 2023-03-21 MED ORDER — AMPHETAMINE-DEXTROAMPHET ER 30 MG PO CP24: 30.0000 mg | ORAL_CAPSULE | ORAL | 0 refills | Status: DC

## 2023-03-21 NOTE — Progress Notes (Signed)
 Virtual Visit Consent   Charles [name], you are scheduled for a virtual visit with Mary-Margaret Gladis, FNP, a [facility] provider, today.     Just as with appointments in the office, your consent must be obtained to participate.  Your consent will be active for this visit and any virtual visit you may have with one of our providers in the next 365 days.     If you have a MyChart account, a copy of this consent can be sent to you electronically.  All virtual visits are billed to your insurance company just like a traditional visit in the office.    As this is a virtual visit, video technology does not allow for your provider to perform a traditional examination.  This may limit your provider's ability to fully assess your condition.  If your provider identifies any concerns that need to be evaluated in person or the need to arrange testing (such as labs, EKG, etc.), we will make arrangements to do so.     Although advances in technology are sophisticated, we cannot ensure that it will always work on either your end or our end.  If the connection with a video visit is poor, the visit may have to be switched to a telephone visit.  With either a video or telephone visit, we are not always able to ensure that we have a secure connection.     I need to obtain your verbal consent now.   Are you willing to proceed with your visit today? YES   Charles [name] has provided verbal consent on 03/21/2023 for a virtual visit (video or telephone).   Mary-Margaret Gladis, FNP   Date: 03/21/2023 10:20 AM   Virtual Visit via Video Note   I, Mary-Margaret Gladis, connected with Charles [name] ([id number], 11-22-1990) on 03/21/23 at  4:15 PM EST by a video-enabled telemedicine application and verified that I am speaking with the correct person using two identifiers.  Location: Patient: Virtual Visit Location Patient: Home Provider: Virtual Visit Location Provider: Mobile   I discussed the limitations of  evaluation and management by telemedicine and the availability of in person appointments. The patient expressed understanding and agreed to proceed.    History of Present Illness: Charles [name] is a 33 y.o. who identifies as a male who was assigned male at birth, and is being seen today for ADHD.  HPI: Patient doing video visit for adhd follow up. He is currently on adderallXR 30mg  and is doing well. Denies any medication side effects.    Review of Systems  Constitutional:  Negative for diaphoresis and weight loss.  Eyes:  Negative for blurred vision, double vision and pain.  Respiratory:  Negative for shortness of breath.   Cardiovascular:  Negative for chest pain, palpitations, orthopnea and leg swelling.  Gastrointestinal:  Negative for abdominal pain.  Skin:  Negative for rash.  Neurological:  Negative for dizziness, sensory change, loss of consciousness, weakness and headaches.  Endo/Heme/Allergies:  Negative for polydipsia. Does not bruise/bleed easily.  Psychiatric/Behavioral:  Negative for memory loss. The patient does not have insomnia.   All other systems reviewed and are negative.   Problems:  Patient Active Problem List   Diagnosis Date Noted   Primary hypertension 09/14/2021   Somatic dysfunction of spine, sacral 07/30/2021   Hypermobility syndrome 06/06/2021   Recurrent major depressive disorder, in partial remission (HCC) 01/28/2017   Hypogonadism in male 01/28/2017   Attention deficit hyperactivity disorder (ADHD), combined type  08/03/2014   Gynecomastia, male - bilateral 04/17/2012    Allergies:  Allergies  Allergen Reactions   Indomethacin      Brain fog, balance issues, cognitive dissonance.    Lamictal [Lamotrigine] Other (See Comments)    Muscle Spasms in back   Peanut-Containing Drug Products     Specifically Brazilian nuts   Medications:  Current Outpatient Medications:    allopurinol  (ZYLOPRIM ) 300 MG tablet, Take 1 tablet by mouth once daily,  Disp: 90 tablet, Rfl: 0   amphetamine -dextroamphetamine  (ADDERALL XR) 30 MG 24 hr capsule, Take 1 capsule (30 mg total) by mouth every morning., Disp: 30 capsule, Rfl: 0   amphetamine -dextroamphetamine  (ADDERALL XR) 30 MG 24 hr capsule, Take 1 capsule (30 mg total) by mouth every morning., Disp: 30 capsule, Rfl: 0   amphetamine -dextroamphetamine  (ADDERALL XR) 30 MG 24 hr capsule, Take 1 capsule (30 mg total) by mouth every morning., Disp: 30 capsule, Rfl: 0   ARIPiprazole  (ABILIFY ) 10 MG tablet, Take 1 tablet (10 mg total) by mouth daily., Disp: 90 tablet, Rfl: 0   colchicine  0.6 MG tablet, Take 0.6mg  po qd, increase to BID for flares., Disp: 60 tablet, Rfl: 3   diphenhydrAMINE  (BENADRYL ) 25 MG tablet, Take 1 tablet (25 mg total) by mouth every 6 (six) hours as needed for itching., Disp: , Rfl:    EPINEPHrine  0.3 mg/0.3 mL IJ SOAJ injection, Inject 0.3 mLs (0.3 mg total) into the muscle once as needed for up to 1 dose (Tongue or throat swelling)., Disp: 1 Device, Rfl: 1   FLUoxetine  (PROZAC ) 40 MG capsule, Take 1 capsule (40 mg total) by mouth daily., Disp: 90 capsule, Rfl: 1   hydrochlorothiazide  (HYDRODIURIL ) 25 MG tablet, Take 1 tablet by mouth once daily, Disp: 30 tablet, Rfl: 2   naproxen  (NAPROSYN ) 500 MG tablet, Take by mouth., Disp: , Rfl:    testosterone  cypionate (DEPOTESTOSTERONE CYPIONATE) 200 MG/ML injection, Inject 0.5 mLs (100 mg total) into the muscle every 14 (fourteen) days., Disp: 10 mL, Rfl: 1   valACYclovir  (VALTREX ) 1000 MG tablet, Take 1 tablet (1,000 mg total) by mouth 2 (two) times daily as needed. Prn as needed for fever blisters, Disp: 30 tablet, Rfl: 1  Observations/Objective: Patient is well-developed, well-nourished in no acute distress.  Resting comfortably  at home.  Head is normocephalic, atraumatic.  No labored breathing.  Speech is clear and coherent with logical content.  Patient is alert and oriented at baseline.    Assessment and Plan:  Eva JONETTA [facility] in  today with chief complaint of No chief complaint on file.   1. Attention deficit hyperactivity disorder (ADHD), combined type Stress management - amphetamine -dextroamphetamine  (ADDERALL XR) 30 MG 24 hr capsule; Take 1 capsule (30 mg total) by mouth every morning.  Dispense: 30 capsule; Refill: 0 - amphetamine -dextroamphetamine  (ADDERALL XR) 30 MG 24 hr capsule; Take 1 capsule (30 mg total) by mouth every morning.  Dispense: 30 capsule; Refill: 0 - amphetamine -dextroamphetamine  (ADDERALL XR) 30 MG 24 hr capsule; Take 1 capsule (30 mg total) by mouth every morning.  Dispense: 30 capsule; Refill: 0    Follow Up Instructions: I discussed the assessment and treatment plan with the patient. The patient was provided an opportunity to ask questions and all were answered. The patient agreed with the plan and demonstrated an understanding of the instructions.  A copy of instructions were sent to the patient via MyChart.  The patient was advised to call back or seek an in-person evaluation if the symptoms worsen or if  the condition fails to improve as anticipated.  Time:  I spent 10 minutes with the patient via telehealth technology discussing the above problems/concerns.    Mary-Margaret Gladis, FNP

## 2023-03-27 ENCOUNTER — Inpatient Hospital Stay: Payer: 59 | Admitting: Internal Medicine

## 2023-03-27 ENCOUNTER — Inpatient Hospital Stay: Payer: 59 | Attending: Internal Medicine

## 2023-03-27 VITALS — BP 167/106 | HR 88 | Temp 98.2°F | Resp 18 | Wt 184.4 lb

## 2023-03-27 DIAGNOSIS — D751 Secondary polycythemia: Secondary | ICD-10-CM | POA: Diagnosis present

## 2023-03-27 DIAGNOSIS — D45 Polycythemia vera: Secondary | ICD-10-CM | POA: Diagnosis not present

## 2023-03-27 DIAGNOSIS — I10 Essential (primary) hypertension: Secondary | ICD-10-CM | POA: Diagnosis not present

## 2023-03-27 DIAGNOSIS — M109 Gout, unspecified: Secondary | ICD-10-CM | POA: Diagnosis not present

## 2023-03-27 LAB — CMP (CANCER CENTER ONLY)
ALT: 104 U/L — ABNORMAL HIGH (ref 0–44)
AST: 49 U/L — ABNORMAL HIGH (ref 15–41)
Albumin: 4.3 g/dL (ref 3.5–5.0)
Alkaline Phosphatase: 90 U/L (ref 38–126)
Anion gap: 8 (ref 5–15)
BUN: 12 mg/dL (ref 6–20)
CO2: 31 mmol/L (ref 22–32)
Calcium: 9.5 mg/dL (ref 8.9–10.3)
Chloride: 102 mmol/L (ref 98–111)
Creatinine: 1.02 mg/dL (ref 0.61–1.24)
GFR, Estimated: >60 mL/min (ref >60)
Glucose, Bld: 83 mg/dL (ref 70–99)
Potassium: 3.8 mmol/L (ref 3.5–5.1)
Sodium: 141 mmol/L (ref 135–145)
Total Bilirubin: 0.5 mg/dL (ref 0.0–1.2)
Total Protein: 7 g/dL (ref 6.5–8.1)

## 2023-03-27 LAB — CBC WITH DIFFERENTIAL (CANCER CENTER ONLY)
Abs Immature Granulocytes: 0.02 10*3/uL (ref 0.00–0.07)
Basophils Absolute: 0 10*3/uL (ref 0.0–0.1)
Basophils Relative: 1 %
Eosinophils Absolute: 0.2 10*3/uL (ref 0.0–0.5)
Eosinophils Relative: 3 %
HCT: 50.1 % (ref 39.0–52.0)
Hemoglobin: 17.8 g/dL — ABNORMAL HIGH (ref 13.0–17.0)
Immature Granulocytes: 0 %
Lymphocytes Relative: 27 %
Lymphs Abs: 1.9 10*3/uL (ref 0.7–4.0)
MCH: 31.1 pg (ref 26.0–34.0)
MCHC: 35.5 g/dL (ref 30.0–36.0)
MCV: 87.6 fL (ref 80.0–100.0)
Monocytes Absolute: 0.5 10*3/uL (ref 0.1–1.0)
Monocytes Relative: 8 %
Neutro Abs: 4.3 10*3/uL (ref 1.7–7.7)
Neutrophils Relative %: 61 %
Platelet Count: 328 10*3/uL (ref 150–400)
RBC: 5.72 MIL/uL (ref 4.22–5.81)
RDW: 12.2 % (ref 11.5–15.5)
WBC Count: 7 10*3/uL (ref 4.0–10.5)
nRBC: 0 % (ref 0.0–0.2)

## 2023-03-27 LAB — LACTATE DEHYDROGENASE: LDH: 157 U/L (ref 98–192)

## 2023-03-27 NOTE — Progress Notes (Signed)
[facility] [phone]   [phone]  OFFICE PROGRESS NOTE  Bennie Pierini, FNP [address] 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 Possible contributing factors include testosterone use, high iron diet, and alcohol consumption.   PRIOR THERAPY: None  CURRENT THERAPY: None  INTERVAL HISTORY: Charles [name] 33 y.o. male returns to the clinic today for follow-up visit.Discussed the use of AI scribe software for clinical note transcription with the patient, who gave verbal consent to proceed.  History of Present Illness   The patient, a 33 year old with a history of elevated hemoglobin, hematocrit, and red blood cells, was last seen approximately two months ago. At that time, it was suspected that the elevated levels were reactive in nature, possibly due to hormone treatment. However, the patient clarified that he was not on testosterone treatment at the time. The patient also has a history of alcohol consumption, but has significantly reduced his intake recently. He has also made dietary changes to reduce iron intake, including consuming less red meat and more chicken.  The patient's recent lab results show similar levels to the previous visit, with a hemoglobin of 17.8. The patient reports feeling generally well, with the exception of a recent gout attack. He has also been experiencing high blood pressure and has started using a one-month pill planner to ensure consistent medication intake.       MEDICAL HISTORY: Past Medical History:  Diagnosis Date   ADD (attention deficit disorder) f   Allergic rhinitis    Bipolar depression (HCC)    Gout    Gynecomastia, male    Hypermobility of joint    Raynauds syndrome     ALLERGIES:  is allergic to indomethacin, lamictal [lamotrigine], and peanut-containing drug products.  MEDICATIONS:  Current Outpatient Medications  Medication Sig Dispense  Refill   allopurinol (ZYLOPRIM) 300 MG tablet Take 1 tablet by mouth once daily 90 tablet 0   amphetamine-dextroamphetamine (ADDERALL XR) 30 MG 24 hr capsule Take 1 capsule (30 mg total) by mouth every morning. 30 capsule 0   [START ON 04/20/2023] amphetamine-dextroamphetamine (ADDERALL XR) 30 MG 24 hr capsule Take 1 capsule (30 mg total) by mouth every morning. 30 capsule 0   [START ON 05/20/2023] amphetamine-dextroamphetamine (ADDERALL XR) 30 MG 24 hr capsule Take 1 capsule (30 mg total) by mouth every morning. 30 capsule 0   ARIPiprazole (ABILIFY) 10 MG tablet Take 1 tablet (10 mg total) by mouth daily. 90 tablet 0   colchicine 0.6 MG tablet Take 0.6mg  po qd, increase to BID for flares. 60 tablet 3   diphenhydrAMINE (BENADRYL) 25 MG tablet Take 1 tablet (25 mg total) by mouth every 6 (six) hours as needed for itching.     EPINEPHrine 0.3 mg/0.3 mL IJ SOAJ injection Inject 0.3 mLs (0.3 mg total) into the muscle once as needed for up to 1 dose (Tongue or throat swelling). 1 Device 1   FLUoxetine (PROZAC) 40 MG capsule Take 1 capsule (40 mg total) by mouth daily. 90 capsule 1   hydrochlorothiazide (HYDRODIURIL) 25 MG tablet Take 1 tablet by mouth once daily 30 tablet 2   naproxen (NAPROSYN) 500 MG tablet Take by mouth.     testosterone cypionate (DEPOTESTOSTERONE CYPIONATE) 200 MG/ML injection Inject 0.5 mLs (100 mg total) into the muscle every 14 (fourteen) days. 10 mL 1   valACYclovir (VALTREX) 1000 MG tablet Take 1 tablet (1,000 mg total) by mouth 2 (two) times daily  as needed. Prn as needed for fever blisters 30 tablet 1   No current facility-administered medications for this visit.    SURGICAL HISTORY:  Past Surgical History:  Procedure Laterality Date   KNEE ARTHROSCOPY W/ MENISCECTOMY  11/23/2005   left knee - extensive bucket handle tear of medial meniscus   SHOULDER ARTHROSCOPY W/ BANKART PROCEDURE Right 08/10/2006   TONSILLECTOMY  09/16/1995   WISDOM TOOTH EXTRACTION      REVIEW OF  SYSTEMS:  A comprehensive review of systems was negative.   PHYSICAL EXAMINATION: General appearance: alert, cooperative, and no distress Head: Normocephalic, without obvious abnormality, atraumatic Neck: no adenopathy, no JVD, supple, symmetrical, trachea midline, and thyroid not enlarged, symmetric, no tenderness/mass/nodules Lymph nodes: Cervical, supraclavicular, and axillary nodes normal. Resp: clear to auscultation bilaterally Back: symmetric, no curvature. ROM normal. No CVA tenderness. Cardio: regular rate and rhythm, S1, S2 normal, no murmur, click, rub or gallop GI: soft, non-tender; bowel sounds normal; no masses,  no organomegaly Extremities: extremities normal, atraumatic, no cyanosis or edema  ECOG PERFORMANCE STATUS: 0 - Asymptomatic  Blood pressure (!) 167/106, pulse 88, temperature 98.2 F (36.8 C), temperature source Temporal, resp. rate 18, weight 184 lb 6.4 oz (83.6 kg), SpO2 100%.  LABORATORY DATA: Lab Results  Component Value Date   WBC 6.3 01/25/2023   HGB 17.8 (H) 01/25/2023   HCT 51.1 01/25/2023   MCV 90.3 01/25/2023   PLT 307 01/25/2023      Chemistry      Component Value Date/Time   NA 140 01/25/2023 1010   NA 138 09/28/2021 1608   K 3.8 01/25/2023 1010   CL 108 01/25/2023 1010   CO2 29 01/25/2023 1010   BUN 10 01/25/2023 1010   BUN 13 09/28/2021 1608   CREATININE 0.99 01/25/2023 1010   CREATININE 1.09 01/16/2023 0819      Component Value Date/Time   CALCIUM 9.1 01/25/2023 1010   ALKPHOS 62 01/25/2023 1010   AST 35 01/25/2023 1010   ALT 87 (H) 01/25/2023 1010   BILITOT 0.6 01/25/2023 1010       RADIOGRAPHIC STUDIES: No results found.  ASSESSMENT AND PLAN:      Elevated Hemoglobin and Hematocrit Persistent elevated hemoglobin (17.8) and hematocrit, unchanged from November. Differential diagnosis includes reactive erythrocytosis versus polycythemia vera. JAK2 mutation panel ordered to differentiate. Phlebotomy considered if  polycythemia vera is confirmed. - Order JAK2 mutation panel - If JAK2 mutation positive, schedule phlebotomy - If JAK2 mutation negative, discharge from hematology and oncology follow-up and refer to primary care  Gout Recent gout attack reported. He has been reducing red meat intake and alcohol consumption, known triggers for gout. Encouraged to continue dietary changes. - Continue to reduce red meat intake - Continue to limit alcohol consumption  Hypertension Elevated blood pressure noted. He reports adherence to medication regimen with a pill planner. Encouraged to continue this practice. - Continue current antihypertensive medications - Monitor blood pressure regularly  General Health Maintenance Lifestyle modifications include reducing alcohol intake and red meat consumption. Encouraged to maintain these changes. - Continue to limit alcohol intake - Continue to reduce red meat consumption  Follow-up - Call with JAK2 mutation panel results - If JAK2 mutation positive, schedule follow-up for phlebotomy - If JAK2 mutation negative, no further follow-up with hematology and oncology, refer to primary care.   The patient was advised to call immediately if he has any concerning symptoms in the interval. The patient voices understanding of current disease status and treatment options  and is in agreement with the current care plan.  All questions were answered. The patient knows to call the clinic with any problems, questions or concerns. We can certainly see the patient much sooner if necessary.  The total time spent in the appointment was 20 minutes.  Disclaimer: This note was dictated with voice recognition software. Similar sounding words can inadvertently be transcribed and may not be corrected upon review.

## 2023-03-28 ENCOUNTER — Encounter: Payer: Self-pay | Admitting: Nurse Practitioner

## 2023-03-28 ENCOUNTER — Ambulatory Visit (INDEPENDENT_AMBULATORY_CARE_PROVIDER_SITE_OTHER): Payer: 59 | Admitting: Nurse Practitioner

## 2023-03-28 VITALS — BP 149/99 | HR 91 | Temp 98.1°F | Ht 68.0 in | Wt 183.0 lb

## 2023-03-28 DIAGNOSIS — I10 Essential (primary) hypertension: Secondary | ICD-10-CM

## 2023-03-28 MED ORDER — LISINOPRIL-HYDROCHLOROTHIAZIDE 20-12.5 MG PO TABS: 1.0000 | ORAL_TABLET | Freq: Every day | ORAL | 1 refills | Status: DC

## 2023-03-28 NOTE — Progress Notes (Signed)
   Subjective:    Patient ID: Charles [name], male    DOB: [birth year], 33 y.o.   MRN: [id number]   Chief Complaint: blood pressure issues  HPI  Patient comes in stating that blood pressure has been running high. He seen hematologist yesterday and blood pressure was 160's systolic. He is currently on hydrochlorothiazide 25mg  daily. Patient Active Problem List   Diagnosis Date Noted   Primary hypertension 09/14/2021   Somatic dysfunction of spine, sacral 07/30/2021   Hypermobility syndrome 06/06/2021   Recurrent major depressive disorder, in partial remission (HCC) 01/28/2017   Hypogonadism in male 01/28/2017   Attention deficit hyperactivity disorder (ADHD), combined type 08/03/2014   Gynecomastia, male - bilateral 04/17/2012       Review of Systems  Constitutional:  Negative for diaphoresis.  Eyes:  Negative for pain.  Respiratory:  Negative for shortness of breath.   Cardiovascular:  Negative for chest pain, palpitations and leg swelling.  Gastrointestinal:  Negative for abdominal pain.  Endocrine: Negative for polydipsia.  Skin:  Negative for rash.  Neurological:  Negative for dizziness, weakness and headaches.  Hematological:  Does not bruise/bleed easily.  All other systems reviewed and are negative.      Objective:   Physical Exam Constitutional:      Appearance: Normal appearance.  Cardiovascular:     Rate and Rhythm: Normal rate and regular rhythm.     Heart sounds: Normal heart sounds.  Pulmonary:     Effort: Pulmonary effort is normal.     Breath sounds: Normal breath sounds.  Skin:    General: Skin is warm.  Neurological:     General: No focal deficit present.     Mental Status: He is alert and oriented to person, place, and time.  Psychiatric:        Mood and Affect: Mood normal.        Behavior: Behavior normal.     BP (!) 149/99   Pulse 91   Temp 98.1 F (36.7 C) (Temporal)   Ht 5\' 8"  (1.727 m)   Wt 183 lb (83 kg)   SpO2 98%   BMI 27.83  kg/m        Assessment & Plan:   [name] in today with chief complaint of BP has been running high   1. Primary hypertension (Primary) Dash diet Keep diary of blood pressure - lisinopril-hydrochlorothiazide (ZESTORETIC) 20-12.5 MG tablet; Take 1 tablet by mouth daily.  Dispense: 90 tablet; Refill: 1    The above assessment and management plan was discussed with the patient. The patient verbalized understanding of and has agreed to the management plan. Patient is aware to call the clinic if symptoms persist or worsen. Patient is aware when to return to the clinic for a follow-up visit. Patient educated on when it is appropriate to go to the emergency department.   Mary-Margaret Daphine Deutscher, FNP

## 2023-03-28 NOTE — Patient Instructions (Signed)

## 2023-04-02 LAB — JAK2 GENOTYPR

## 2023-04-04 ENCOUNTER — Telehealth: Payer: Self-pay | Admitting: Medical Oncology

## 2023-04-04 NOTE — Telephone Encounter (Signed)
Pt notified that per Dr Arbutus Ped his JAK 2 mutation was negative for polycythemia vera and to f/u with PCP. He does not need to come back to see Dr. Arbutus Ped. Pt voiced understanding.

## 2023-06-17 ENCOUNTER — Encounter: Payer: Self-pay | Admitting: Nurse Practitioner

## 2023-06-17 ENCOUNTER — Ambulatory Visit: Payer: 59 | Admitting: Nurse Practitioner

## 2023-06-17 DIAGNOSIS — F902 Attention-deficit hyperactivity disorder, combined type: Secondary | ICD-10-CM | POA: Diagnosis not present

## 2023-06-17 MED ORDER — AMPHETAMINE-DEXTROAMPHET ER 30 MG PO CP24: 30.0000 mg | ORAL_CAPSULE | ORAL | 0 refills | Status: DC

## 2023-06-17 NOTE — Progress Notes (Signed)
 Subjective:    Patient ID: Charles [name], male    DOB: [birth year], 33 y.o.   MRN: [id number]    Chief Complaint: medical management of chronic issues     HPI:  Charles [name] is a 33 y.o. who identifies as a male who was assigned male at birth.   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 Attention deficit hyperactivity disorder (ADHD), combined type Is on adderall daily. He has been on adderall off and on since he was in high school. He is not able to concentrate at work without meds.         New complaints: None today  Allergies  Allergen Reactions   Indomethacin     Brain fog, balance issues, cognitive dissonance.    Lamictal [Lamotrigine] Other (See Comments)    "Muscle Spasms in back"   Peanut-Containing Drug Products     Specifically [place] nuts   Outpatient Encounter Medications as of 06/17/2023  Medication Sig   allopurinol (ZYLOPRIM) 300 MG tablet Take 1 tablet by mouth once daily   ARIPiprazole (ABILIFY) 10 MG tablet Take 1 tablet (10 mg total) by mouth daily.   colchicine 0.6 MG tablet Take 0.6mg  po qd, increase to BID for flares.   diphenhydrAMINE (BENADRYL) 25 MG tablet Take 1 tablet (25 mg total) by mouth every 6 (six) hours as needed for itching.   EPINEPHrine 0.3 mg/0.3 mL IJ SOAJ injection Inject 0.3 mLs (0.3 mg total) into the muscle once as needed for up to 1 dose (Tongue or throat swelling).   FLUoxetine (PROZAC) 40 MG capsule Take 1 capsule (40 mg total) by mouth daily.   lisinopril-hydrochlorothiazide (ZESTORETIC) 20-12.5 MG tablet Take 1 tablet by mouth daily.   naproxen (NAPROSYN) 500 MG tablet Take by mouth.   valACYclovir (VALTREX) 1000 MG tablet Take 1 tablet (1,000 mg total) by mouth 2 (two) times daily as needed. Prn as needed for fever blisters   [DISCONTINUED] amphetamine-dextroamphetamine (ADDERALL XR) 30 MG 24 hr  capsule Take 1 capsule (30 mg total) by mouth every morning.   [START ON 06/19/2023] amphetamine-dextroamphetamine (ADDERALL XR) 30 MG 24 hr capsule Take 1 capsule (30 mg total) by mouth every morning.   [START ON 07/19/2023] amphetamine-dextroamphetamine (ADDERALL XR) 30 MG 24 hr capsule Take 1 capsule (30 mg total) by mouth every morning.   [START ON 08/18/2023] amphetamine-dextroamphetamine (ADDERALL XR) 30 MG 24 hr capsule Take 1 capsule (30 mg total) by mouth every morning.   testosterone cypionate (DEPOTESTOSTERONE CYPIONATE) 200 MG/ML injection Inject 0.5 mLs (100 mg total) into the muscle every 14 (fourteen) days. (Patient not taking: Reported on 06/17/2023)   [DISCONTINUED] amphetamine-dextroamphetamine (ADDERALL XR) 30 MG 24 hr capsule Take 1 capsule (30 mg total) by mouth every morning.   [DISCONTINUED] amphetamine-dextroamphetamine (ADDERALL XR) 30 MG 24 hr capsule Take 1 capsule (30 mg total) by mouth every morning.   No facility-administered encounter medications on file as of 06/17/2023.    Past Surgical History:  Procedure Laterality Date   KNEE ARTHROSCOPY W/ MENISCECTOMY  11/23/2005   left knee - extensive bucket handle tear of medial meniscus   SHOULDER ARTHROSCOPY W/ BANKART PROCEDURE Right 08/10/2006   TONSILLECTOMY  09/16/1995   WISDOM TOOTH EXTRACTION      Family History  Problem Relation Age of Onset   Depression Mother    Heart Problems Father  Hypertension Father    Healthy Sister    Cancer Maternal Aunt        breast   Heart attack Maternal Grandfather    Diabetes Paternal Grandmother    Diabetes Paternal Grandfather    Hyperlipidemia Paternal Grandfather    Hypertension Paternal Grandfather    Heart disease Paternal Grandfather       Controlled substance contract: 02/04/22 drug screen today     Review of Systems  Constitutional:  Negative for diaphoresis.  Eyes:  Negative for pain.  Respiratory:  Negative for shortness of breath.   Cardiovascular:   Negative for chest pain, palpitations and leg swelling.  Gastrointestinal:  Negative for abdominal pain.  Endocrine: Negative for polydipsia.  Skin:  Negative for rash.  Neurological:  Negative for dizziness, weakness and headaches.  Hematological:  Does not bruise/bleed easily.  All other systems reviewed and are negative.      Objective:   Physical Exam Vitals and nursing note reviewed.  Constitutional:      Appearance: Normal appearance. He is well-developed.  Neck:     Thyroid: No thyroid mass or thyromegaly.     Vascular: No carotid bruit or JVD.     Trachea: Phonation normal.  Cardiovascular:     Rate and Rhythm: Normal rate and regular rhythm.  Pulmonary:     Effort: Pulmonary effort is normal. No respiratory distress.     Breath sounds: Normal breath sounds.  Abdominal:     General: Bowel sounds are normal.     Palpations: Abdomen is soft.     Tenderness: There is no abdominal tenderness.  Musculoskeletal:        General: Normal range of motion.     Cervical back: Normal range of motion and neck supple.  Lymphadenopathy:     Cervical: No cervical adenopathy.  Skin:    General: Skin is warm and dry.  Neurological:     Mental Status: He is alert and oriented to person, place, and time.  Psychiatric:        Behavior: Behavior normal.        Thought Content: Thought content normal.        Judgment: Judgment normal.     BP (!) 131/90   Pulse 83   Temp 98.3 F (36.8 C) (Temporal)   Ht 5\' 8"  (1.727 m)   Wt 192 lb (87.1 kg)   SpO2 96%   BMI 29.19 kg/m        Assessment & Plan:  Charles [name] in today with chief complaint of ADHD   1. Attention deficit hyperactivity disorder (ADHD), combined type - amphetamine-dextroamphetamine (ADDERALL XR) 30 MG 24 hr capsule; Take 1 capsule (30 mg total) by mouth every morning.  Dispense: 30 capsule; Refill: 0 - amphetamine-dextroamphetamine (ADDERALL XR) 30 MG 24 hr capsule; Take 1 capsule (30 mg total) by mouth every  morning.  Dispense: 30 capsule; Refill: 0 - amphetamine-dextroamphetamine (ADDERALL XR) 30 MG 24 hr capsule; Take 1 capsule (30 mg total) by mouth every morning.  Dispense: 30 capsule; Refill: 0 - ToxASSURE Select 13 (MW), Urine    The above assessment and management plan was discussed with the patient. The patient verbalized understanding of and has agreed to the management plan. Patient is aware to call the clinic if symptoms persist or worsen. Patient is aware when to return to the clinic for a follow-up visit. Patient educated on when it is appropriate to go to the emergency department.   Mary-Margaret Daphine Deutscher, FNP

## 2023-06-26 ENCOUNTER — Other Ambulatory Visit: Payer: Self-pay | Admitting: Physician Assistant

## 2023-06-26 NOTE — Telephone Encounter (Signed)
 Last Fill: 06/10/2022  Labs: 03/27/2023 Hgb 17.8, AST 49, ALT 104  Next Visit: 07/18/2023  Last Visit: 01/16/2023  DX: Idiopathic chronic gout of right ankle without tophus   Current Dose per office note 01/16/2023: colchicine 0.6 mg 1 tablet daily as needed.    Okay to refill Colchicine?

## 2023-07-04 NOTE — Progress Notes (Signed)
 Office Visit Note  Patient: Charles [name]             Date of Birth: 05/26/1990           MRN: [id number]             PCP: Delfina Feller, FNP Referring: Delfina Feller, * Visit Date: 07/18/2023 Occupation: @GUAROCC @  Subjective:  Medication management  History of Present Illness: Charles [name] is a 33 y.o. male with gout.  He returns today after his last visit in November 2024.  He states that he restarted drinking alcohol and end of January.  He decided to quit drinking alcohol again yesterday.  He states he had a mild flare of gout in his right ankle about a month ago and the symptoms improved after taking colchicine .  He has been avoiding red meat and shellfish.  He is on tobacco free nicotine pouches now.  None of the joints are painful today.    Activities of Daily Living:  Patient reports morning stiffness for 1-2 minutes.   Patient Denies nocturnal pain.  Difficulty dressing/grooming: Denies Difficulty climbing stairs: Denies Difficulty getting out of chair: Denies Difficulty using hands for taps, buttons, cutlery, and/or writing: Denies  Review of Systems  Constitutional:  Negative for fatigue.  HENT:  Negative for mouth sores and mouth dryness.   Eyes:  Negative for dryness.  Respiratory:  Negative for shortness of breath.   Cardiovascular:  Negative for chest pain and palpitations.  Gastrointestinal:  Negative for blood in stool, constipation and diarrhea.  Endocrine: Negative for increased urination.  Genitourinary:  Negative for involuntary urination.  Musculoskeletal:  Positive for morning stiffness. Negative for joint pain, gait problem, joint pain, joint swelling, myalgias, muscle weakness, muscle tenderness and myalgias.  Skin:  Negative for color change, rash, hair loss and sensitivity to sunlight.  Allergic/Immunologic: Negative for susceptible to infections.  Neurological:  Negative for dizziness and headaches.  Hematological:  Negative for  swollen glands.  Psychiatric/Behavioral:  Positive for depressed mood and sleep disturbance. The patient is not nervous/anxious.     PMFS History:  Patient Active Problem List   Diagnosis Date Noted   Idiopathic chronic gout of right ankle without tophus 07/18/2023   Raynaud's disease without gangrene 07/18/2023   Primary hypertension 09/14/2021   Somatic dysfunction of spine, sacral 07/30/2021   Hypermobility syndrome 06/06/2021   Recurrent major depressive disorder, in partial remission (HCC) 01/28/2017   Hypogonadism in male 01/28/2017   Attention deficit hyperactivity disorder (ADHD), combined type 08/03/2014   Gynecomastia, male - bilateral 04/17/2012    Past Medical History:  Diagnosis Date   ADD (attention deficit disorder) f   Allergic rhinitis    Bipolar depression (HCC)    Gout    Gynecomastia, male    Hypermobility of joint    Raynauds syndrome     Family History  Problem Relation Age of Onset   Depression Mother    Heart Problems Father    Hypertension Father    Healthy Sister    Cancer Maternal Aunt        breast   Heart attack Maternal Grandfather    Diabetes Paternal Grandmother    Diabetes Paternal Grandfather    Hyperlipidemia Paternal Grandfather    Hypertension Paternal Grandfather    Heart disease Paternal Grandfather    Past Surgical History:  Procedure Laterality Date   KNEE ARTHROSCOPY W/ MENISCECTOMY  11/23/2005   left knee - extensive bucket handle tear of medial meniscus  SHOULDER ARTHROSCOPY W/ BANKART PROCEDURE Right 08/10/2006   TONSILLECTOMY  09/16/1995   WISDOM TOOTH EXTRACTION     Social History   Social History Narrative   Not on file   Immunization History  Administered Date(s) Administered   DTaP 02/21/1989, 05/02/1989, 07/11/1989, 06/30/1990, 07/25/1993   HIB (PRP-OMP) 02/21/1989, 05/02/1989, 07/11/1989, 06/30/1990   Hepatitis A 06/17/2005, 12/27/2005   Hepatitis B 07/28/1992, 09/21/1992, 02/07/1993   IPV 02/21/1989,  05/02/1989, 06/30/1990, 07/25/1993   Influenza,Quad,Nasal, Live 12/31/2012, 02/07/2014, 01/22/2018   Influenza,inj,Quad PF,6+ Mos 12/23/2014, 01/28/2017, 01/25/2019   MMR 04/09/1990, 07/25/1993   Meningococcal Conjugate 12/27/2005   Tdap 12/22/2006, 01/14/2023     Objective: Vital Signs: BP (!) 135/90 (BP Location: Left Arm, Patient Position: Sitting, Cuff Size: Normal)   Pulse 76   Resp 16   Ht 5\' 8"  (1.727 m)   Wt 190 lb 12.8 oz (86.5 kg)   BMI 29.01 kg/m    Physical Exam Vitals and nursing note reviewed.  Constitutional:      Appearance: He is well-developed.  HENT:     Head: Normocephalic and atraumatic.  Eyes:     Conjunctiva/sclera: Conjunctivae normal.     Pupils: Pupils are equal, round, and reactive to light.  Cardiovascular:     Rate and Rhythm: Normal rate and regular rhythm.     Heart sounds: Normal heart sounds.  Pulmonary:     Effort: Pulmonary effort is normal.     Breath sounds: Normal breath sounds.  Abdominal:     General: Bowel sounds are normal.     Palpations: Abdomen is soft.  Musculoskeletal:     Cervical back: Normal range of motion and neck supple.  Skin:    General: Skin is warm and dry.     Capillary Refill: Capillary refill takes less than 2 seconds.  Neurological:     Mental Status: He is alert and oriented to person, place, and time.  Psychiatric:        Behavior: Behavior normal.      Musculoskeletal Exam: Cervical, thoracic and lumbar spine were in good range of motion.  He was able to reach the floor with the palm of his hands.  Shoulders, elbows, wrists, MCPs PIPs and DIPs Juengel range of motion with no synovitis.  Hip joints, knee joints, ankles, MTPs and PIPs with good range of motion with no synovitis.  CDAI Exam: CDAI Score: -- Patient Global: --; Provider Global: -- Swollen: --; Tender: -- Joint Exam 07/18/2023   No joint exam has been documented for this visit   There is currently no information documented on the  homunculus. Go to the Rheumatology activity and complete the homunculus joint exam.  Investigation: No additional findings.  Imaging: No results found.  Recent Labs: Lab Results  Component Value Date   WBC 7.0 03/27/2023   HGB 17.8 (H) 03/27/2023   PLT 328 03/27/2023   NA 141 03/27/2023   K 3.8 03/27/2023   CL 102 03/27/2023   CO2 31 03/27/2023   GLUCOSE 83 03/27/2023   BUN 12 03/27/2023   CREATININE 1.02 03/27/2023   BILITOT 0.5 03/27/2023   ALKPHOS 90 03/27/2023   AST 49 (H) 03/27/2023   ALT 104 (H) 03/27/2023   PROT 7.0 03/27/2023   ALBUMIN 4.3 03/27/2023   CALCIUM 9.5 03/27/2023    Speciality Comments: No specialty comments available.  Procedures:  No procedures performed Allergies: Indomethacin , Lamictal [lamotrigine], and Peanut-containing drug products   Assessment / Plan:     Visit Diagnoses: Idiopathic  chronic gout of right ankle without tophus - uric acid: 8.6 on 01/16/2023. -Patient reports having 1 episode of gout flare in his right ankle about a month ago.  He states he has restarted drinking alcohol in January and had been drinking quite heavily in the last few months.  He decided to quit drinking alcohol yesterday.  He states he will drink only occasional wine.  He has been trying to avoid red meat and shellfish.  He has been taking allopurinol  300 mg daily without any interruption.  He takes colchicine  only on as needed basis.  Last uric acid was plan: Uric acid 8.6 on January 16, 2023 which was higher than previous values.  Prescription refill for allopurinol  was sent today.  Medication monitoring encounter - allopurinol  300 mg daily and colchicine  0.6 mg 1 tablet daily as needed. -March 27, 2023 hemoglobin was high at 17.8 and LFTs were elevated at AST 49 and ALT 104.  Will recheck labs today.  Plan: CBC with Differential/Platelet, Comprehensive metabolic panel with GFR  Raynaud's disease without gangrene -he continues to have bowel symptoms.  All  autoimmune work-up had been negative.  Hypermobility of joint - he has had osteopathic manipulation by Dr. Felipe Horton in the past.  Joint protection and isometric exercise were discussed.  Elevated LFTs-due to alcohol use.  Patient is trying to quit alcohol.  Alcohol use -patient states he has been drinking about a case of beer weekly recently.  He thinks that his last beer was yesterday.  Patient states he will cut back on alcohol.  And will try to switch to occasional wine.  Elevated hemoglobin (HCC)-patient was evaluated by hematology.  I reviewed Dr. Bing Buff note from March 27, 2023.  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  History of bulimia  Chews tobacco-patient states he has been chewing nicotine free tobacco now.  Attention deficit hyperactivity disorder (ADHD), combined type  Gynecomastia, male - bilateral  Hypogonadism in male  Recurrent major depressive disorder, in partial remission (HCC)  BMI 29.01-weight loss diet and exercise was emphasized.  Dietary modifications were discussed.  Increased risk of heart disease with Was discussed.  Patient plans on joining a gym.  Orders: Orders Placed This Encounter  Procedures   CBC with Differential/Platelet   Comprehensive metabolic panel with GFR   Uric acid   Meds ordered this encounter  Medications   allopurinol  (ZYLOPRIM ) 300 MG tablet    Sig: Take 1 tablet (300 mg total) by mouth daily.    Dispense:  90 tablet    Refill:  0     Follow-Up Instructions: Return in about 6 months (around 01/18/2024) for Gout.   Nicholas Bari, MD  Note - This record has been created using Animal nutritionist.  Chart creation errors have been sought, but may not always  have been located. Such creation errors do not reflect on  the standard of medical care.

## 2023-07-18 ENCOUNTER — Ambulatory Visit: Payer: 59 | Attending: Rheumatology | Admitting: Rheumatology

## 2023-07-18 ENCOUNTER — Encounter: Payer: Self-pay | Admitting: Rheumatology

## 2023-07-18 VITALS — BP 135/90 | HR 76 | Resp 16 | Ht 68.0 in | Wt 190.8 lb

## 2023-07-18 DIAGNOSIS — Z720 Tobacco use: Secondary | ICD-10-CM

## 2023-07-18 DIAGNOSIS — D582 Other hemoglobinopathies: Secondary | ICD-10-CM

## 2023-07-18 DIAGNOSIS — F3341 Major depressive disorder, recurrent, in partial remission: Secondary | ICD-10-CM

## 2023-07-18 DIAGNOSIS — F902 Attention-deficit hyperactivity disorder, combined type: Secondary | ICD-10-CM

## 2023-07-18 DIAGNOSIS — M249 Joint derangement, unspecified: Secondary | ICD-10-CM | POA: Diagnosis not present

## 2023-07-18 DIAGNOSIS — Z8659 Personal history of other mental and behavioral disorders: Secondary | ICD-10-CM

## 2023-07-18 DIAGNOSIS — R7989 Other specified abnormal findings of blood chemistry: Secondary | ICD-10-CM

## 2023-07-18 DIAGNOSIS — Z6829 Body mass index (BMI) 29.0-29.9, adult: Secondary | ICD-10-CM

## 2023-07-18 DIAGNOSIS — I7300 Raynaud's syndrome without gangrene: Secondary | ICD-10-CM

## 2023-07-18 DIAGNOSIS — E291 Testicular hypofunction: Secondary | ICD-10-CM

## 2023-07-18 DIAGNOSIS — M1A0710 Idiopathic chronic gout, right ankle and foot, without tophus (tophi): Secondary | ICD-10-CM | POA: Diagnosis not present

## 2023-07-18 DIAGNOSIS — N62 Hypertrophy of breast: Secondary | ICD-10-CM

## 2023-07-18 DIAGNOSIS — F1090 Alcohol use, unspecified, uncomplicated: Secondary | ICD-10-CM

## 2023-07-18 DIAGNOSIS — Z5181 Encounter for therapeutic drug level monitoring: Secondary | ICD-10-CM | POA: Diagnosis not present

## 2023-07-18 LAB — CBC WITH DIFFERENTIAL/PLATELET
Absolute Lymphocytes: 2087 {cells}/uL (ref 850–3900)
Absolute Monocytes: 655 {cells}/uL (ref 200–950)
Basophils Absolute: 31 {cells}/uL (ref 0–200)
Basophils Relative: 0.4 %
Eosinophils Absolute: 123 {cells}/uL (ref 15–500)
Eosinophils Relative: 1.6 %
HCT: 50.4 % — ABNORMAL HIGH (ref 38.5–50.0)
Hemoglobin: 17.6 g/dL — ABNORMAL HIGH (ref 13.2–17.1)
MCH: 31.4 pg (ref 27.0–33.0)
MCHC: 34.9 g/dL (ref 32.0–36.0)
MCV: 89.8 fL (ref 80.0–100.0)
MPV: 10.6 fL (ref 7.5–12.5)
Monocytes Relative: 8.5 %
Neutro Abs: 4805 {cells}/uL (ref 1500–7800)
Neutrophils Relative %: 62.4 %
Platelets: 307 10*3/uL (ref 140–400)
RBC: 5.61 10*6/uL (ref 4.20–5.80)
RDW: 12.5 % (ref 11.0–15.0)
Total Lymphocyte: 27.1 %
WBC: 7.7 10*3/uL (ref 3.8–10.8)

## 2023-07-18 LAB — COMPREHENSIVE METABOLIC PANEL WITH GFR
AG Ratio: 2 (calc) (ref 1.0–2.5)
ALT: 69 U/L — ABNORMAL HIGH (ref 9–46)
AST: 34 U/L (ref 10–40)
Albumin: 4.6 g/dL (ref 3.6–5.1)
Alkaline phosphatase (APISO): 67 U/L (ref 36–130)
BUN: 12 mg/dL (ref 7–25)
CO2: 27 mmol/L (ref 20–32)
Calcium: 9.9 mg/dL (ref 8.6–10.3)
Chloride: 100 mmol/L (ref 98–110)
Creat: 1.03 mg/dL (ref 0.60–1.26)
Globulin: 2.3 g/dL (ref 1.9–3.7)
Glucose, Bld: 87 mg/dL (ref 65–99)
Potassium: 4.1 mmol/L (ref 3.5–5.3)
Sodium: 137 mmol/L (ref 135–146)
Total Bilirubin: 0.7 mg/dL (ref 0.2–1.2)
Total Protein: 6.9 g/dL (ref 6.1–8.1)
eGFR: 98 mL/min/{1.73_m2} (ref >=60)

## 2023-07-18 LAB — URIC ACID: Uric Acid, Serum: 6.9 mg/dL (ref 4.0–8.0)

## 2023-07-18 MED ORDER — ALLOPURINOL 300 MG PO TABS: 300.0000 mg | ORAL_TABLET | Freq: Every day | ORAL | 0 refills | Status: DC

## 2023-07-21 NOTE — Progress Notes (Signed)
 CBC shows elevated hemoglobin which is stable.  CMP shows elevated liver function.  Patient stated that he will stop drinking alcohol.  Uric acid is better at 6.9.  He should continue current treatment.  Please forward results to his PCP.

## 2023-07-25 ENCOUNTER — Encounter: Payer: Self-pay | Admitting: Family

## 2023-07-25 ENCOUNTER — Telehealth: Admitting: Family

## 2023-07-25 DIAGNOSIS — L258 Unspecified contact dermatitis due to other agents: Secondary | ICD-10-CM | POA: Diagnosis not present

## 2023-07-25 MED ORDER — TRIAMCINOLONE ACETONIDE 0.5 % EX OINT: 1.0000 | TOPICAL_OINTMENT | Freq: Two times a day (BID) | CUTANEOUS | 0 refills | Status: DC

## 2023-07-25 MED ORDER — PREDNISONE 20 MG PO TABS: 40.0000 mg | ORAL_TABLET | Freq: Every day | ORAL | 0 refills | Status: AC

## 2023-07-25 NOTE — Progress Notes (Signed)
 Virtual Visit Consent   Charles [name], you are scheduled for a virtual visit with a [facility] provider today. Just as with appointments in the office, your consent must be obtained to participate. Your consent will be active for this visit and any virtual visit you may have with one of our providers in the next 365 days. If you have a MyChart account, a copy of this consent can be sent to you electronically.  As this is a virtual visit, video technology does not allow for your provider to perform a traditional examination. This may limit your provider's ability to fully assess your condition. If your provider identifies any concerns that need to be evaluated in person or the need to arrange testing (such as labs, EKG, etc.), we will make arrangements to do so. Although advances in technology are sophisticated, we cannot ensure that it will always work on either your end or our end. If the connection with a video visit is poor, the visit may have to be switched to a telephone visit. With either a video or telephone visit, we are not always able to ensure that we have a secure connection.  By engaging in this virtual visit, you consent to the provision of healthcare and authorize for your insurance to be billed (if applicable) for the services provided during this visit. Depending on your insurance coverage, you may receive a charge related to this service.  I need to obtain your verbal consent now. Are you willing to proceed with your visit today? Charles [name] has provided verbal consent on 07/25/2023 for a virtual visit (video or telephone). Tommas Fragmin, FNP  Date: 07/25/2023 3:31 PM   Virtual Visit via Video Note   I, Tommas Fragmin, connected with  Charles [name]  ([id number], November 22, 1990) on 07/25/23 at  6:00 PM EDT by a video-enabled telemedicine application and verified that I am speaking with the correct person using two identifiers.  Location: Patient: Virtual Visit Location Patient:  Mobile Provider: Virtual Visit Location Provider: Home Office   I discussed the limitations of evaluation and management by telemedicine and the availability of in person appointments. The patient expressed understanding and agreed to proceed.    History of Present Illness: Charles [name] is a 33 y.o. who identifies as a male who was assigned male at birth, and is being seen today for rash left forearm that he noticed this AM.  HPI: Rash This is a new problem. The current episode started today. The problem has been gradually worsening since onset. The affected locations include the left wrist. The rash is characterized by itchiness and redness. He was exposed to nothing. Pertinent negatives include no rhinorrhea or shortness of breath. Past treatments include topical steroids. The treatment provided mild relief.    Problems:  Patient Active Problem List   Diagnosis Date Noted   Idiopathic chronic gout of right ankle without tophus 07/18/2023   Raynaud's disease without gangrene 07/18/2023   Primary hypertension 09/14/2021   Somatic dysfunction of spine, sacral 07/30/2021   Hypermobility syndrome 06/06/2021   Recurrent major depressive disorder, in partial remission (HCC) 01/28/2017   Hypogonadism in male 01/28/2017   Attention deficit hyperactivity disorder (ADHD), combined type 08/03/2014   Gynecomastia, male - bilateral 04/17/2012    Allergies:  Allergies  Allergen Reactions   Indomethacin      Brain fog, balance issues, cognitive dissonance.    Lamictal [Lamotrigine] Other (See Comments)    "Muscle Spasms in back"   Peanut-Containing Drug  Products     Specifically [place] nuts   Medications:  Current Outpatient Medications:    predniSONE  (DELTASONE ) 20 MG tablet, Take 2 tablets (40 mg total) by mouth daily with breakfast for 5 days., Disp: 10 tablet, Rfl: 0   triamcinolone ointment (KENALOG) 0.5 %, Apply 1 Application topically 2 (two) times daily., Disp: 30 g, Rfl: 0    allopurinol  (ZYLOPRIM ) 300 MG tablet, Take 1 tablet (300 mg total) by mouth daily., Disp: 90 tablet, Rfl: 0   amphetamine -dextroamphetamine  (ADDERALL XR) 30 MG 24 hr capsule, Take 1 capsule (30 mg total) by mouth every morning. (Patient not taking: Reported on 07/18/2023), Disp: 30 capsule, Rfl: 0   amphetamine -dextroamphetamine  (ADDERALL XR) 30 MG 24 hr capsule, Take 1 capsule (30 mg total) by mouth every morning., Disp: 30 capsule, Rfl: 0   [START ON 08/18/2023] amphetamine -dextroamphetamine  (ADDERALL XR) 30 MG 24 hr capsule, Take 1 capsule (30 mg total) by mouth every morning. (Patient not taking: Reported on 07/18/2023), Disp: 30 capsule, Rfl: 0   ARIPiprazole  (ABILIFY ) 10 MG tablet, Take 1 tablet (10 mg total) by mouth daily., Disp: 90 tablet, Rfl: 0   colchicine  0.6 MG tablet, TAKE 1 TABLET BY MOUTH ONCE DAILY, INCREASE TO  TWICE DAILY FOR FLARES, Disp: 60 tablet, Rfl: 2   diphenhydrAMINE  (BENADRYL ) 25 MG tablet, Take 1 tablet (25 mg total) by mouth every 6 (six) hours as needed for itching. (Patient not taking: Reported on 07/18/2023), Disp: , Rfl:    EPINEPHrine  0.3 mg/0.3 mL IJ SOAJ injection, Inject 0.3 mLs (0.3 mg total) into the muscle once as needed for up to 1 dose (Tongue or throat swelling)., Disp: 1 Device, Rfl: 1   FLUoxetine  (PROZAC ) 40 MG capsule, Take 1 capsule (40 mg total) by mouth daily., Disp: 90 capsule, Rfl: 1   lisinopril -hydrochlorothiazide  (ZESTORETIC ) 20-12.5 MG tablet, Take 1 tablet by mouth daily., Disp: 90 tablet, Rfl: 1   naproxen  (NAPROSYN ) 500 MG tablet, Take by mouth as needed., Disp: , Rfl:    testosterone  cypionate (DEPOTESTOSTERONE CYPIONATE) 200 MG/ML injection, Inject 0.5 mLs (100 mg total) into the muscle every 14 (fourteen) days. (Patient not taking: Reported on 07/18/2023), Disp: 10 mL, Rfl: 1   valACYclovir  (VALTREX ) 1000 MG tablet, Take 1 tablet (1,000 mg total) by mouth 2 (two) times daily as needed. Prn as needed for fever blisters, Disp: 30 tablet, Rfl:  1  Observations/Objective: Patient is well-developed, well-nourished in no acute distress.  Resting comfortably  at home.  Head is normocephalic, atraumatic.  No labored breathing.  Speech is clear and coherent with logical content.  Patient is alert and oriented at baseline.  Left wrist erythemas rash  Assessment and Plan: 1. Contact dermatitis due to other agent, unspecified contact dermatitis type (Primary) - triamcinolone ointment (KENALOG) 0.5 %; Apply 1 Application topically 2 (two) times daily.  Dispense: 30 g; Refill: 0 - predniSONE  (DELTASONE ) 20 MG tablet; Take 2 tablets (40 mg total) by mouth daily with breakfast for 5 days.  Dispense: 10 tablet; Refill: 0  Pt will try kenalog cream BID If rash spreads or worsens over weekend, start oral prednisone   Cool compresses  Good hand hygiene  Follow up if symptoms worsen or do not improve   Follow Up Instructions: I discussed the assessment and treatment plan with the patient. The patient was provided an opportunity to ask questions and all were answered. The patient agreed with the plan and demonstrated an understanding of the instructions.  A copy of instructions were sent  to the patient via MyChart unless otherwise noted below.     The patient was advised to call back or seek an in-person evaluation if the symptoms worsen or if the condition fails to improve as anticipated.    Tommas Fragmin, FNP

## 2023-09-08 ENCOUNTER — Other Ambulatory Visit: Payer: Self-pay | Admitting: Nurse Practitioner

## 2023-09-08 DIAGNOSIS — F3341 Major depressive disorder, recurrent, in partial remission: Secondary | ICD-10-CM

## 2023-09-22 ENCOUNTER — Encounter: Payer: Self-pay | Admitting: Nurse Practitioner

## 2023-09-22 ENCOUNTER — Ambulatory Visit: Admitting: Nurse Practitioner

## 2023-09-22 VITALS — BP 132/86 | HR 102 | Temp 97.9°F | Ht 68.0 in | Wt 194.0 lb

## 2023-09-22 DIAGNOSIS — F902 Attention-deficit hyperactivity disorder, combined type: Secondary | ICD-10-CM | POA: Diagnosis not present

## 2023-09-22 DIAGNOSIS — F3341 Major depressive disorder, recurrent, in partial remission: Secondary | ICD-10-CM

## 2023-09-22 DIAGNOSIS — I10 Essential (primary) hypertension: Secondary | ICD-10-CM | POA: Diagnosis not present

## 2023-09-22 MED ORDER — ARIPIPRAZOLE 10 MG PO TABS: 10.0000 mg | ORAL_TABLET | Freq: Every day | ORAL | 1 refills | Status: DC

## 2023-09-22 MED ORDER — LISINOPRIL-HYDROCHLOROTHIAZIDE 20-12.5 MG PO TABS: 1.0000 | ORAL_TABLET | Freq: Every day | ORAL | 1 refills | Status: DC

## 2023-09-22 MED ORDER — AMPHETAMINE-DEXTROAMPHET ER 30 MG PO CP24: 30.0000 mg | ORAL_CAPSULE | ORAL | 0 refills | Status: DC

## 2023-09-22 MED ORDER — AMPHETAMINE-DEXTROAMPHET ER 30 MG PO CP24
30.0000 mg | ORAL_CAPSULE | ORAL | 0 refills | Status: DC
Start: 2023-10-22 — End: 2023-12-05

## 2023-09-22 NOTE — Progress Notes (Signed)
 Subjective:    Patient ID: Charles [name], male    DOB: 08/02/1990, 33 y.o.   MRN: [id number]    Chief Complaint: medical management of chronic issues     HPI:  Charles [name] is a 33 y.o. who identifies as a male who was assigned male at birth.   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 Attention deficit hyperactivity disorder (ADHD), combined type Is on adderall daily. He has been on adderall off and on since he was in high school. He is not able to concentrate at work without meds.  2. hypertension No c/o chest pain, sob or headache.  BP Readings from Last 3 Encounters:  09/22/23 (!) 139/94  07/18/23 (!) 135/90  06/17/23 (!) 131/90     3. depression Is currently on abilify  daily and is doing well    06/17/2023    4:05 PM 03/28/2023   12:26 PM 12/13/2022    4:14 PM  Depression screen PHQ 2/9  Decreased Interest 1 1 1   Down, Depressed, Hopeless 1 1 1   PHQ - 2 Score 2 2 2   Altered sleeping 1 0 0  Tired, decreased energy 0 0 1  Change in appetite 1 0 1  Feeling bad or failure about yourself  1 0 0  Trouble concentrating 0 1 0  Moving slowly or fidgety/restless 0 0 0  Suicidal thoughts 0 0 0  PHQ-9 Score 5 3 4   Difficult doing work/chores Somewhat difficult Not difficult at all Somewhat difficult      06/17/2023    4:05 PM 03/28/2023   12:26 PM 12/13/2022    4:15 PM 09/24/2022    4:21 PM  GAD 7 : Generalized Anxiety Score  Nervous, Anxious, on Edge 0 0 0 0  Control/stop worrying 0 0 0 0  Worry too much - different things 0 0 0 0  Trouble relaxing 0 0 1 1  Restless 0 0 0 0  Easily annoyed or irritable 0 0 1 0  Afraid - awful might happen 0 0 0 0  Total GAD 7 Score 0 0 2 1  Anxiety Difficulty Not difficult at all Not difficult at all Not difficult at all Not difficult at all      New complaints: None today  Allergies  Allergen Reactions    Indomethacin      Brain fog, balance issues, cognitive dissonance.    Lamictal [Lamotrigine] Other (See Comments)    Muscle Spasms in back   Peanut-Containing Drug Products     Specifically [place] nuts   Outpatient Encounter Medications as of 09/22/2023  Medication Sig   allopurinol  (ZYLOPRIM ) 300 MG tablet Take 1 tablet (300 mg total) by mouth daily.   amphetamine -dextroamphetamine  (ADDERALL XR) 30 MG 24 hr capsule Take 1 capsule (30 mg total) by mouth every morning. (Patient not taking: Reported on 07/18/2023)   amphetamine -dextroamphetamine  (ADDERALL XR) 30 MG 24 hr capsule Take 1 capsule (30 mg total) by mouth every morning.   amphetamine -dextroamphetamine  (ADDERALL XR) 30 MG 24 hr capsule Take 1 capsule (30 mg total) by mouth every morning. (Patient not taking: Reported on 07/18/2023)   ARIPiprazole  (ABILIFY ) 10 MG tablet Take 1 tablet by mouth once daily   colchicine  0.6 MG tablet TAKE 1 TABLET BY MOUTH ONCE DAILY, INCREASE TO  TWICE DAILY FOR FLARES   diphenhydrAMINE  (BENADRYL ) 25 MG tablet Take 1  tablet (25 mg total) by mouth every 6 (six) hours as needed for itching. (Patient not taking: Reported on 07/18/2023)   EPINEPHrine  0.3 mg/0.3 mL IJ SOAJ injection Inject 0.3 mLs (0.3 mg total) into the muscle once as needed for up to 1 dose (Tongue or throat swelling).   FLUoxetine  (PROZAC ) 40 MG capsule Take 1 capsule (40 mg total) by mouth daily.   lisinopril -hydrochlorothiazide  (ZESTORETIC ) 20-12.5 MG tablet Take 1 tablet by mouth daily.   naproxen  (NAPROSYN ) 500 MG tablet Take by mouth as needed.   testosterone  cypionate (DEPOTESTOSTERONE CYPIONATE) 200 MG/ML injection Inject 0.5 mLs (100 mg total) into the muscle every 14 (fourteen) days. (Patient not taking: Reported on 07/18/2023)   triamcinolone  ointment (KENALOG ) 0.5 % Apply 1 Application topically 2 (two) times daily.   valACYclovir  (VALTREX ) 1000 MG tablet Take 1 tablet (1,000 mg total) by mouth 2 (two) times daily as needed. Prn as  needed for fever blisters   No facility-administered encounter medications on file as of 09/22/2023.    Past Surgical History:  Procedure Laterality Date   KNEE ARTHROSCOPY W/ MENISCECTOMY  11/23/2005   left knee - extensive bucket handle tear of medial meniscus   SHOULDER ARTHROSCOPY W/ BANKART PROCEDURE Right 08/10/2006   TONSILLECTOMY  09/16/1995   WISDOM TOOTH EXTRACTION      Family History  Problem Relation Age of Onset   Depression Mother    Heart Problems Father    Hypertension Father    Healthy Sister    Cancer Maternal Aunt        breast   Heart attack Maternal Grandfather    Diabetes Paternal Grandmother    Diabetes Paternal Grandfather    Hyperlipidemia Paternal Grandfather    Hypertension Paternal Grandfather    Heart disease Paternal Grandfather       Controlled substance contract: 02/04/22 drug screen today     Review of Systems  Constitutional:  Negative for diaphoresis.  Eyes:  Negative for pain.  Respiratory:  Negative for shortness of breath.   Cardiovascular:  Negative for chest pain, palpitations and leg swelling.  Gastrointestinal:  Negative for abdominal pain.  Endocrine: Negative for polydipsia.  Skin:  Negative for rash.  Neurological:  Negative for dizziness, weakness and headaches.  Hematological:  Does not bruise/bleed easily.  All other systems reviewed and are negative.      Objective:   Physical Exam Vitals and nursing note reviewed.  Constitutional:      Appearance: Normal appearance. He is well-developed.  Neck:     Thyroid : No thyroid  mass or thyromegaly.     Vascular: No carotid bruit or JVD.     Trachea: Phonation normal.  Cardiovascular:     Rate and Rhythm: Normal rate and regular rhythm.  Pulmonary:     Effort: Pulmonary effort is normal. No respiratory distress.     Breath sounds: Normal breath sounds.  Abdominal:     General: Bowel sounds are normal.     Palpations: Abdomen is soft.     Tenderness: There is no  abdominal tenderness.  Musculoskeletal:        General: Normal range of motion.     Cervical back: Normal range of motion and neck supple.  Lymphadenopathy:     Cervical: No cervical adenopathy.  Skin:    General: Skin is warm and dry.  Neurological:     Mental Status: He is alert and oriented to person, place, and time.  Psychiatric:        Behavior:  Behavior normal.        Thought Content: Thought content normal.        Judgment: Judgment normal.     BP (!) 139/94   Pulse (!) 102   Temp 97.9 F (36.6 C) (Temporal)   Ht 5' 8 (1.727 m)   Wt 194 lb (88 kg)   SpO2 96%   BMI 29.50 kg/m         Assessment & Plan:  Eva JONETTA Baptist in today with chief complaint of Medical Management of Chronic Issues   1. Primary hypertension (Primary) Low sodium die Keep daily of blood pressure at home - lisinopril -hydrochlorothiazide  (ZESTORETIC ) 20-12.5 MG tablet; Take 1 tablet by mouth daily.  Dispense: 90 tablet; Refill: 1  2. Attention deficit hyperactivity disorder (ADHD), combined type Stress management - amphetamine -dextroamphetamine  (ADDERALL XR) 30 MG 24 hr capsule; Take 1 capsule (30 mg total) by mouth every morning.  Dispense: 30 capsule; Refill: 0 - amphetamine -dextroamphetamine  (ADDERALL XR) 30 MG 24 hr capsule; Take 1 capsule (30 mg total) by mouth every morning.  Dispense: 30 capsule; Refill: 0 - amphetamine -dextroamphetamine  (ADDERALL XR) 30 MG 24 hr capsule; Take 1 capsule (30 mg total) by mouth every morning.  Dispense: 30 capsule; Refill: 0  3. Recurrent major depressive disorder, in partial remission (HCC) - ARIPiprazole  (ABILIFY ) 10 MG tablet; Take 1 tablet (10 mg total) by mouth daily.  Dispense: 90 tablet; Refill: 1    The above assessment and management plan was discussed with the patient. The patient verbalized understanding of and has agreed to the management plan. Patient is aware to call the clinic if symptoms persist or worsen. Patient is aware when to return  to the clinic for a follow-up visit. Patient educated on when it is appropriate to go to the emergency department.   Mary-Margaret Gladis, FNP

## 2023-09-22 NOTE — Addendum Note (Signed)
 Addended by: GLADIS MUSTARD on: 09/22/2023 04:29 PM   Modules accepted: Orders

## 2023-09-22 NOTE — Patient Instructions (Signed)

## 2023-09-24 LAB — TOXASSURE SELECT 13 (MW), URINE

## 2023-10-23 ENCOUNTER — Telehealth (INDEPENDENT_AMBULATORY_CARE_PROVIDER_SITE_OTHER): Admitting: Family Medicine

## 2023-10-23 ENCOUNTER — Encounter: Payer: Self-pay | Admitting: Family Medicine

## 2023-10-23 DIAGNOSIS — J0140 Acute pansinusitis, unspecified: Secondary | ICD-10-CM

## 2023-10-23 MED ORDER — AMOXICILLIN-POT CLAVULANATE 875-125 MG PO TABS: 1.0000 | ORAL_TABLET | Freq: Two times a day (BID) | ORAL | 0 refills | Status: DC

## 2023-10-23 MED ORDER — FLUTICASONE PROPIONATE 50 MCG/ACT NA SUSP: 2.0000 | Freq: Every day | NASAL | 6 refills | Status: AC

## 2023-10-23 NOTE — Progress Notes (Signed)
 Virtual Visit via Video   I connected with patient on 10/23/23 at 1223 by a video enabled telemedicine application and verified that I am speaking with the correct person using two identifiers.  Location patient: Home Location provider: [facility] Persons participating in the virtual visit: Patient and Provider  I discussed the limitations of evaluation and management by telemedicine and the availability of in person appointments. The patient expressed understanding and agreed to proceed.  Subjective:   HPI:  Pt presents today for  Chief Complaint  Patient presents with   Sinusitis   Charles [name] is a 33 year old male who presents with sinus pressure, cramps, nausea, and headaches.  He has been experiencing sinus pressure, cramps, nausea, and headaches for approximately four days. He suspects a sinus infection as the cause. Over-the-counter medications, including cough medicine and a generic version of Aleve  cold and sinus, have been used without significant relief.  No fevers, cold chills, sweating, or feeling cold. He has not used Flonase  or any other nasal sprays to address his symptoms.      ROS per HPI   Patient Active Problem List   Diagnosis Date Noted   Idiopathic chronic gout of right ankle without tophus 07/18/2023   Raynaud's disease without gangrene 07/18/2023   Primary hypertension 09/14/2021   Somatic dysfunction of spine, sacral 07/30/2021   Hypermobility syndrome 06/06/2021   Recurrent major depressive disorder, in partial remission (HCC) 01/28/2017   Hypogonadism in male 01/28/2017   Attention deficit hyperactivity disorder (ADHD), combined type 08/03/2014   Gynecomastia, male - bilateral 04/17/2012    Social History   Tobacco Use   Smoking status: Never    Passive exposure: Past (minimal)   Smokeless tobacco: Former    Types: Chew  Substance Use Topics   Alcohol use: Yes    Alcohol/week: 4.0 standard  drinks of alcohol    Types: 4 Cans of beer per week    Comment: occ    Current Outpatient Medications:    amoxicillin -clavulanate (AUGMENTIN ) 875-125 MG tablet, Take 1 tablet by mouth 2 (two) times daily., Disp: 20 tablet, Rfl: 0   fluticasone  (FLONASE ) 50 MCG/ACT nasal spray, Place 2 sprays into both nostrils daily., Disp: 16 g, Rfl: 6   allopurinol  (ZYLOPRIM ) 300 MG tablet, Take 1 tablet (300 mg total) by mouth daily., Disp: 90 tablet, Rfl: 0   amphetamine -dextroamphetamine  (ADDERALL XR) 30 MG 24 hr capsule, Take 1 capsule (30 mg total) by mouth every morning., Disp: 30 capsule, Rfl: 0   amphetamine -dextroamphetamine  (ADDERALL XR) 30 MG 24 hr capsule, Take 1 capsule (30 mg total) by mouth every morning., Disp: 30 capsule, Rfl: 0   [START ON 11/21/2023] amphetamine -dextroamphetamine  (ADDERALL XR) 30 MG 24 hr capsule, Take 1 capsule (30 mg total) by mouth every morning., Disp: 30 capsule, Rfl: 0   ARIPiprazole  (ABILIFY ) 10 MG tablet, Take 1 tablet (10 mg total) by mouth daily., Disp: 90 tablet, Rfl: 1   colchicine  0.6 MG tablet, TAKE 1 TABLET BY MOUTH ONCE DAILY, INCREASE TO  TWICE DAILY FOR FLARES, Disp: 60 tablet, Rfl: 2   diphenhydrAMINE  (BENADRYL ) 25 MG tablet, Take 1 tablet (25 mg total) by mouth every 6 (six) hours as needed for itching., Disp: , Rfl:    EPINEPHrine  0.3 mg/0.3 mL IJ SOAJ injection, Inject 0.3 mLs (0.3 mg total) into the muscle once as needed for up to 1 dose (Tongue or throat swelling)., Disp: 1 Device, Rfl: 1   FLUoxetine  (PROZAC )  40 MG capsule, Take 1 capsule (40 mg total) by mouth daily., Disp: 90 capsule, Rfl: 1   lisinopril -hydrochlorothiazide  (ZESTORETIC ) 20-12.5 MG tablet, Take 1 tablet by mouth daily., Disp: 90 tablet, Rfl: 1   naproxen  (NAPROSYN ) 500 MG tablet, Take by mouth as needed., Disp: , Rfl:    testosterone  cypionate (DEPOTESTOSTERONE CYPIONATE) 200 MG/ML injection, Inject 0.5 mLs (100 mg total) into the muscle every 14 (fourteen) days. (Patient not taking:  Reported on 09/22/2023), Disp: 10 mL, Rfl: 1   triamcinolone  ointment (KENALOG ) 0.5 %, Apply 1 Application topically 2 (two) times daily., Disp: 30 g, Rfl: 0   valACYclovir  (VALTREX ) 1000 MG tablet, Take 1 tablet (1,000 mg total) by mouth 2 (two) times daily as needed. Prn as needed for fever blisters, Disp: 30 tablet, Rfl: 1  Allergies  Allergen Reactions   Indomethacin      Brain fog, balance issues, cognitive dissonance.    Lamictal [Lamotrigine] Other (See Comments)    Muscle Spasms in back   Peanut-Containing Drug Products     Specifically [place] nuts    Objective:   There were no vitals taken for this visit.  Patient is well-developed, well-nourished in no acute distress.  Resting comfortably at home.  Head is normocephalic, atraumatic.  No labored breathing.  Speech is clear and coherent with logical content.  Patient is alert and oriented at baseline.    Assessment and Plan:   Charles [name] was seen today for sinusitis.  Diagnoses and all orders for this visit:  Acute non-recurrent pansinusitis -     fluticasone  (FLONASE ) 50 MCG/ACT nasal spray; Place 2 sprays into both nostrils daily. -     amoxicillin -clavulanate (AUGMENTIN ) 875-125 MG tablet; Take 1 tablet by mouth 2 (two) times daily.     Acute sinusitis Acute sinusitis with sinus pressure, nausea, abdominal cramps, and headache since Sunday. Over-the-counter medications have not provided relief. No fever or chills. No known allergies to antibiotics. - Prescribe Augmentin  for antibiotic treatment. - Prescribe Flonase  to reduce sinus swelling and congestion. - Advise continued use of over-the-counter cold and sinus medication, such as Aleve  cold and sinus, for congestion and pressure relief. - Encourage increased water intake. - Instruct to use Flonase  every morning for at least 1.5 to 2 weeks. - Send prescriptions to [facility]. - Advise to report if symptoms worsen.        Return if symptoms  worsen or fail to improve.  Rosaline Bruns, FNP-C [facility] [address] [place], [place] [address] [phone]  10/23/2023  Time spent with the patient: 10 minutes, of which >50% was spent in obtaining information about symptoms, reviewing previous labs, evaluations, and treatments, counseling about condition (please see the discussed topics above), and developing a plan to further investigate it; had a number of questions which I addressed.

## 2023-12-05 ENCOUNTER — Ambulatory Visit (INDEPENDENT_AMBULATORY_CARE_PROVIDER_SITE_OTHER): Admitting: Nurse Practitioner

## 2023-12-05 ENCOUNTER — Encounter: Payer: Self-pay | Admitting: Nurse Practitioner

## 2023-12-05 DIAGNOSIS — F902 Attention-deficit hyperactivity disorder, combined type: Secondary | ICD-10-CM | POA: Diagnosis not present

## 2023-12-05 MED ORDER — AMPHETAMINE-DEXTROAMPHET ER 30 MG PO CP24: 30.0000 mg | ORAL_CAPSULE | ORAL | 0 refills | Status: DC

## 2023-12-05 NOTE — Progress Notes (Signed)
 Subjective:    Patient ID: Charles [name], male    DOB: 1990-03-18, 33 y.o.   MRN: [id number]    Chief Complaint: medical management of chronic issues     HPI:  Charles [name] is a 34 y.o. who identifies as a male who was assigned male at birth.   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 Attention deficit hyperactivity disorder (ADHD), combined type Is on adderall daily. He has been on adderall off and on since he was in high school. He is not able to concentrate at work without meds.  2. hypertension No c/o chest pain, sob or headache.  BP Readings from Last 3 Encounters:  12/05/23 139/85  09/22/23 132/86  07/18/23 (!) 135/90     3. depression Is currently on abilify  daily and is doing well    12/05/2023    4:04 PM 09/22/2023    4:29 PM 06/17/2023    4:05 PM  Depression screen PHQ 2/9  Decreased Interest 1 1 1   Down, Depressed, Hopeless 1 0 1  PHQ - 2 Score 2 1 2   Altered sleeping 0 1 1  Tired, decreased energy 1 0 0  Change in appetite 0 0 1  Feeling bad or failure about yourself  1 0 1  Trouble concentrating 0 0 0  Moving slowly or fidgety/restless 0 0 0  Suicidal thoughts 0 0 0  PHQ-9 Score 4 2 5   Difficult doing work/chores Somewhat difficult Not difficult at all Somewhat difficult      12/05/2023    4:04 PM 09/22/2023    4:30 PM 06/17/2023    4:05 PM 03/28/2023   12:26 PM  GAD 7 : Generalized Anxiety Score  Nervous, Anxious, on Edge 1 0 0 0  Control/stop worrying 0 0 0 0  Worry too much - different things 0 0 0 0  Trouble relaxing 0 1 0 0  Restless 0 0 0 0  Easily annoyed or irritable 0 0 0 0  Afraid - awful might happen 0 0 0 0  Total GAD 7 Score 1 1 0 0  Anxiety Difficulty Not difficult at all Not difficult at all Not difficult at all Not difficult at all      New complaints: None today  Allergies  Allergen Reactions    Indomethacin      Brain fog, balance issues, cognitive dissonance.    Lamictal [Lamotrigine] Other (See Comments)    Muscle Spasms in back   Peanut-Containing Drug Products     Specifically [place] nuts   Outpatient Encounter Medications as of 12/05/2023  Medication Sig   allopurinol  (ZYLOPRIM ) 300 MG tablet Take 1 tablet (300 mg total) by mouth daily.   amoxicillin -clavulanate (AUGMENTIN ) 875-125 MG tablet Take 1 tablet by mouth 2 (two) times daily.   amphetamine -dextroamphetamine  (ADDERALL XR) 30 MG 24 hr capsule Take 1 capsule (30 mg total) by mouth every morning.   ARIPiprazole  (ABILIFY ) 10 MG tablet Take 1 tablet (10 mg total) by mouth daily.   colchicine  0.6 MG tablet TAKE 1 TABLET BY MOUTH ONCE DAILY, INCREASE TO  TWICE DAILY FOR FLARES   diphenhydrAMINE  (BENADRYL ) 25 MG tablet Take 1 tablet (25 mg total) by mouth every 6 (six) hours as needed for itching.   EPINEPHrine  0.3 mg/0.3 mL IJ SOAJ injection Inject 0.3 mLs (0.3 mg total) into the muscle once as needed  for up to 1 dose (Tongue or throat swelling).   FLUoxetine  (PROZAC ) 40 MG capsule Take 1 capsule (40 mg total) by mouth daily.   fluticasone  (FLONASE ) 50 MCG/ACT nasal spray Place 2 sprays into both nostrils daily.   lisinopril -hydrochlorothiazide  (ZESTORETIC ) 20-12.5 MG tablet Take 1 tablet by mouth daily.   naproxen  (NAPROSYN ) 500 MG tablet Take by mouth as needed.   testosterone  cypionate (DEPOTESTOSTERONE CYPIONATE) 200 MG/ML injection Inject 0.5 mLs (100 mg total) into the muscle every 14 (fourteen) days.   triamcinolone  ointment (KENALOG ) 0.5 % Apply 1 Application topically 2 (two) times daily.   valACYclovir  (VALTREX ) 1000 MG tablet Take 1 tablet (1,000 mg total) by mouth 2 (two) times daily as needed. Prn as needed for fever blisters   amphetamine -dextroamphetamine  (ADDERALL XR) 30 MG 24 hr capsule Take 1 capsule (30 mg total) by mouth every morning.   amphetamine -dextroamphetamine  (ADDERALL XR) 30 MG 24 hr capsule  Take 1 capsule (30 mg total) by mouth every morning.   No facility-administered encounter medications on file as of 12/05/2023.    Past Surgical History:  Procedure Laterality Date   KNEE ARTHROSCOPY W/ MENISCECTOMY  11/23/2005   left knee - extensive bucket handle tear of medial meniscus   SHOULDER ARTHROSCOPY W/ BANKART PROCEDURE Right 08/10/2006   TONSILLECTOMY  09/16/1995   WISDOM TOOTH EXTRACTION      Family History  Problem Relation Age of Onset   Depression Mother    Heart Problems Father    Hypertension Father    Healthy Sister    Cancer Maternal Aunt        breast   Heart attack Maternal Grandfather    Diabetes Paternal Grandmother    Diabetes Paternal Grandfather    Hyperlipidemia Paternal Grandfather    Hypertension Paternal Grandfather    Heart disease Paternal Grandfather       Controlled substance contract: 02/04/22 drug screen today     Review of Systems  Constitutional:  Negative for diaphoresis.  Eyes:  Negative for pain.  Respiratory:  Negative for shortness of breath.   Cardiovascular:  Negative for chest pain, palpitations and leg swelling.  Gastrointestinal:  Negative for abdominal pain.  Endocrine: Negative for polydipsia.  Skin:  Negative for rash.  Neurological:  Negative for dizziness, weakness and headaches.  Hematological:  Does not bruise/bleed easily.  All other systems reviewed and are negative.      Objective:   Physical Exam Vitals and nursing note reviewed.  Constitutional:      Appearance: Normal appearance. He is well-developed.  Neck:     Thyroid : No thyroid  mass or thyromegaly.     Vascular: No carotid bruit or JVD.     Trachea: Phonation normal.  Cardiovascular:     Rate and Rhythm: Normal rate and regular rhythm.  Pulmonary:     Effort: Pulmonary effort is normal. No respiratory distress.     Breath sounds: Normal breath sounds.  Abdominal:     General: Bowel sounds are normal.     Palpations: Abdomen is soft.      Tenderness: There is no abdominal tenderness.  Musculoskeletal:        General: Normal range of motion.     Cervical back: Normal range of motion and neck supple.  Lymphadenopathy:     Cervical: No cervical adenopathy.  Skin:    General: Skin is warm and dry.  Neurological:     Mental Status: He is alert and oriented to person, place, and time.  Psychiatric:  Behavior: Behavior normal.        Thought Content: Thought content normal.        Judgment: Judgment normal.     BP 139/85   Pulse 81   Temp 97.9 F (36.6 C)   Ht 5' 8 (1.727 m)   Wt 187 lb 3.2 oz (84.9 kg)   SpO2 98%   BMI 28.46 kg/m         Assessment & Plan:  Eva JONETTA Baptist in today with chief complaint of Medication Refill   1. Primary hypertension (Primary) Low sodium die Keep daily of blood pressure at home - lisinopril -hydrochlorothiazide  (ZESTORETIC ) 20-12.5 MG tablet; Take 1 tablet by mouth daily.  Dispense: 90 tablet; Refill: 1  2. Attention deficit hyperactivity disorder (ADHD), combined type Stress management - amphetamine -dextroamphetamine  (ADDERALL XR) 30 MG 24 hr capsule; Take 1 capsule (30 mg total) by mouth every morning.  Dispense: 30 capsule; Refill: 0 - amphetamine -dextroamphetamine  (ADDERALL XR) 30 MG 24 hr capsule; Take 1 capsule (30 mg total) by mouth every morning.  Dispense: 30 capsule; Refill: 0 - amphetamine -dextroamphetamine  (ADDERALL XR) 30 MG 24 hr capsule; Take 1 capsule (30 mg total) by mouth every morning.  Dispense: 30 capsule; Refill: 0  3. Recurrent major depressive disorder, in partial remission (HCC) - ARIPiprazole  (ABILIFY ) 10 MG tablet; Take 1 tablet (10 mg total) by mouth daily.  Dispense: 90 tablet; Refill: 1    The above assessment and management plan was discussed with the patient. The patient verbalized understanding of and has agreed to the management plan. Patient is aware to call the clinic if symptoms persist or worsen. Patient is aware when to return to  the clinic for a follow-up visit. Patient educated on when it is appropriate to go to the emergency department.   Mary-Margaret Gladis, FNP

## 2023-12-15 ENCOUNTER — Other Ambulatory Visit: Payer: Self-pay | Admitting: Nurse Practitioner

## 2024-01-06 NOTE — Progress Notes (Deleted)
 Office Visit Note  Patient: Charles [name]             Date of Birth: 1990-03-26           MRN: [id number]             PCP: Gladis Mustard, FNP Referring: Gladis Mustard, * Visit Date: 01/20/2024 Occupation: Data Unavailable  Subjective:  No chief complaint on file.   History of Present Illness: Charles [name] is a 33 y.o. male ***     Activities of Daily Living:  Patient reports morning stiffness for *** {minute/hour:19697}.   Patient {ACTIONS;DENIES/REPORTS:21021675::Denies} nocturnal pain.  Difficulty dressing/grooming: {ACTIONS;DENIES/REPORTS:21021675::Denies} Difficulty climbing stairs: {ACTIONS;DENIES/REPORTS:21021675::Denies} Difficulty getting out of chair: {ACTIONS;DENIES/REPORTS:21021675::Denies} Difficulty using hands for taps, buttons, cutlery, and/or writing: {ACTIONS;DENIES/REPORTS:21021675::Denies}  No Rheumatology ROS completed.   PMFS History:  Patient Active Problem List   Diagnosis Date Noted   Idiopathic chronic gout of right ankle without tophus 07/18/2023   Raynaud's disease without gangrene 07/18/2023   Primary hypertension 09/14/2021   Somatic dysfunction of spine, sacral 07/30/2021   Hypermobility syndrome 06/06/2021   Recurrent major depressive disorder, in partial remission 01/28/2017   Hypogonadism in male 01/28/2017   Attention deficit hyperactivity disorder (ADHD), combined type 08/03/2014   Gynecomastia, male - bilateral 04/17/2012    Past Medical History:  Diagnosis Date   ADD (attention deficit disorder) f   Allergic rhinitis    Bipolar depression (HCC)    Gout    Gynecomastia, male    Hypermobility of joint    Raynauds syndrome     Family History  Problem Relation Age of Onset   Depression Mother    Heart Problems Father    Hypertension Father    Healthy Sister    Cancer Maternal Aunt        breast   Heart attack Maternal Grandfather    Diabetes Paternal Grandmother    Diabetes Paternal Grandfather     Hyperlipidemia Paternal Grandfather    Hypertension Paternal Grandfather    Heart disease Paternal Grandfather    Past Surgical History:  Procedure Laterality Date   KNEE ARTHROSCOPY W/ MENISCECTOMY  11/23/2005   left knee - extensive bucket handle tear of medial meniscus   SHOULDER ARTHROSCOPY W/ BANKART PROCEDURE Right 08/10/2006   TONSILLECTOMY  09/16/1995   WISDOM TOOTH EXTRACTION     Social History   Tobacco Use   Smoking status: Never    Passive exposure: Past (minimal)   Smokeless tobacco: Former    Types: Engineer, Drilling   Vaping status: Never Used  Substance Use Topics   Alcohol use: Yes    Alcohol/week: 4.0 standard drinks of alcohol    Types: 4 Cans of beer per week    Comment: occ   Drug use: No    Comment: CBC/THC gummy   Social History   Social History Narrative   Not on file     Immunization History  Administered Date(s) Administered   DTaP 02/21/1989, 05/02/1989, 07/11/1989, 06/30/1990, 07/25/1993   HIB (PRP-OMP) 02/21/1989, 05/02/1989, 07/11/1989, 06/30/1990   Hepatitis A 06/17/2005, 12/27/2005   Hepatitis B 07/28/1992, 09/21/1992, 02/07/1993   IPV 02/21/1989, 05/02/1989, 06/30/1990, 07/25/1993   Influenza,Quad,Nasal, Live 12/31/2012, 02/07/2014, 01/22/2018   Influenza,inj,Quad PF,6+ Mos 12/23/2014, 01/28/2017, 01/25/2019   MMR 04/09/1990, 07/25/1993   Meningococcal Conjugate 12/27/2005   Tdap 12/22/2006, 01/14/2023     Objective: Vital Signs: There were no vitals taken for this visit.   Physical Exam   Musculoskeletal Exam: ***  CDAI  Exam: CDAI Score: -- Patient Global: --; Provider Global: -- Swollen: --; Tender: -- Joint Exam 01/20/2024   No joint exam has been documented for this visit   There is currently no information documented on the homunculus. Go to the Rheumatology activity and complete the homunculus joint exam.  Investigation: No additional findings.  Imaging: No results found.  Recent Labs: Lab Results   Component Value Date   WBC 7.7 07/18/2023   HGB 17.6 (H) 07/18/2023   PLT 307 07/18/2023   NA 137 07/18/2023   K 4.1 07/18/2023   CL 100 07/18/2023   CO2 27 07/18/2023   GLUCOSE 87 07/18/2023   BUN 12 07/18/2023   CREATININE 1.03 07/18/2023   BILITOT 0.7 07/18/2023   ALKPHOS 90 03/27/2023   AST 34 07/18/2023   ALT 69 (H) 07/18/2023   PROT 6.9 07/18/2023   ALBUMIN 4.3 03/27/2023   CALCIUM 9.9 07/18/2023    Speciality Comments: No specialty comments available.  Procedures:  No procedures performed Allergies: Indomethacin , Lamictal [lamotrigine], and Peanut-containing drug products   Assessment / Plan:     Visit Diagnoses: No diagnosis found.  Orders: No orders of the defined types were placed in this encounter.  No orders of the defined types were placed in this encounter.   Face-to-face time spent with patient was *** minutes. Greater than 50% of time was spent in counseling and coordination of care.  Follow-Up Instructions: No follow-ups on file.   Daved JAYSON Gavel, CMA  Note - This record has been created using Animal nutritionist.  Chart creation errors have been sought, but may not always  have been located. Such creation errors do not reflect on  the standard of medical care.

## 2024-01-12 ENCOUNTER — Ambulatory Visit: Admitting: Nurse Practitioner

## 2024-01-12 ENCOUNTER — Encounter: Payer: Self-pay | Admitting: Nurse Practitioner

## 2024-01-12 VITALS — BP 117/75 | HR 77 | Temp 98.8°F | Ht 68.0 in | Wt 193.0 lb

## 2024-01-12 DIAGNOSIS — M545 Low back pain, unspecified: Secondary | ICD-10-CM | POA: Diagnosis not present

## 2024-01-12 MED ORDER — KETOROLAC TROMETHAMINE 60 MG/2ML IM SOLN
60.0000 mg | Freq: Once | INTRAMUSCULAR | Status: AC
  Administered 2024-01-12: 60 mg via INTRAMUSCULAR

## 2024-01-12 MED ORDER — CYCLOBENZAPRINE HCL 10 MG PO TABS: 10.0000 mg | ORAL_TABLET | Freq: Three times a day (TID) | ORAL | 1 refills | Status: DC | PRN

## 2024-01-12 MED ORDER — METHYLPREDNISOLONE ACETATE 80 MG/ML IJ SUSP
80.0000 mg | Freq: Once | INTRAMUSCULAR | Status: AC
  Administered 2024-01-12: 80 mg via INTRAMUSCULAR

## 2024-01-12 MED ORDER — PREDNISONE 20 MG PO TABS: 40.0000 mg | ORAL_TABLET | Freq: Every day | ORAL | 0 refills | Status: AC

## 2024-01-12 NOTE — Patient Instructions (Signed)
 Acute Back Pain, Adult Acute back pain is sudden and usually short-lived. It is often caused by an injury to the muscles and tissues in the back. The injury may result from: A muscle, tendon, or ligament getting overstretched or torn. Ligaments are tissues that connect bones to each other. Lifting something improperly can cause a back strain. Wear and tear (degeneration) of the spinal disks. Spinal disks are circular tissue that provide cushioning between the bones of the spine (vertebrae). Twisting motions, such as while playing sports or doing yard work. A hit to the back. Arthritis. You may have a physical exam, lab tests, and imaging tests to find the cause of your pain. Acute back pain usually goes away with rest and home care. Follow these instructions at home: Managing pain, stiffness, and swelling Take over-the-counter and prescription medicines only as told by your health care provider. Treatment may include medicines for pain and inflammation that are taken by mouth or applied to the skin, or muscle relaxants. Your health care provider may recommend applying ice during the first 24-48 hours after your pain starts. To do this: Put ice in a plastic bag. Place a towel between your skin and the bag. Leave the ice on for 20 minutes, 2-3 times a day. Remove the ice if your skin turns bright red. This is very important. If you cannot feel pain, heat, or cold, you have a greater risk of damage to the area. If directed, apply heat to the affected area as often as told by your health care provider. Use the heat source that your health care provider recommends, such as a moist heat pack or a heating pad. Place a towel between your skin and the heat source. Leave the heat on for 20-30 minutes. Remove the heat if your skin turns bright red. This is especially important if you are unable to feel pain, heat, or cold. You have a greater risk of getting burned. Activity  Do not stay in bed. Staying in  bed for more than 1-2 days can delay your recovery. Sit up and stand up straight. Avoid leaning forward when you sit or hunching over when you stand. If you work at a desk, sit close to it so you do not need to lean over. Keep your chin tucked in. Keep your neck drawn back, and keep your elbows bent at a 90-degree angle (right angle). Sit high and close to the steering wheel when you drive. Add lower back (lumbar) support to your car seat, if needed. Take short walks on even surfaces as soon as you are able. Try to increase the length of time you walk each day. Do not sit, drive, or stand in one place for more than 30 minutes at a time. Sitting or standing for long periods of time can put stress on your back. Do not drive or use heavy machinery while taking prescription pain medicine. Use proper lifting techniques. When you bend and lift, use positions that put less stress on your back: [place] your knees. Keep the load close to your body. Avoid twisting. Exercise regularly as told by your health care provider. Exercising helps your back heal faster and helps prevent back injuries by keeping muscles strong and flexible. Work with a physical therapist to make a safe exercise program, as recommended by your health care provider. Do any exercises as told by your physical therapist. Lifestyle Maintain a healthy weight. Extra weight puts stress on your back and makes it difficult to have good  posture. Avoid activities or situations that make you feel anxious or stressed. Stress and anxiety increase muscle tension and can make back pain worse. Learn ways to manage anxiety and stress, such as through exercise. General instructions Sleep on a firm mattress in a comfortable position. Try lying on your side with your knees slightly bent. If you lie on your back, put a pillow under your knees. Keep your head and neck in a straight line with your spine (neutral position) when using electronic equipment like  smartphones or pads. To do this: Raise your smartphone or pad to look at it instead of bending your head or neck to look down. Put the smartphone or pad at the level of your face while looking at the screen. Follow your treatment plan as told by your health care provider. This may include: Cognitive or behavioral therapy. Acupuncture or massage therapy. Meditation or yoga. Contact a health care provider if: You have pain that is not relieved with rest or medicine. You have increasing pain going down into your legs or buttocks. Your pain does not improve after 2 weeks. You have pain at night. You lose weight without trying. You have a fever or chills. You develop nausea or vomiting. You develop abdominal pain. Get help right away if: You develop new bowel or bladder control problems. You have unusual weakness or numbness in your arms or legs. You feel faint. These symptoms may represent a serious problem that is an emergency. Do not wait to see if the symptoms will go away. Get medical help right away. Call your local emergency services (911 in the [place] Do not drive yourself to the hospital. Summary Acute back pain is sudden and usually short-lived. Use proper lifting techniques. When you bend and lift, use positions that put less stress on your back. Take over-the-counter and prescription medicines only as told by your health care provider, and apply heat or ice as told. This information is not intended to replace advice given to you by your health care provider. Make sure you discuss any questions you have with your health care provider. Document Revised: 05/19/2020 Document Reviewed: 05/19/2020 Elsevier Patient Education  2024 [employer].

## 2024-01-12 NOTE — Progress Notes (Signed)
   Subjective:    Patient ID: Charles [name], male    DOB: 08-Jun-1990, 33 y.o.   MRN: [id number]   Chief Complaint: Back Pain   Back Pain This is a new problem. The problem occurs constantly. The problem has been waxing and waning since onset. The pain is present in the lumbar spine. The quality of the pain is described as aching, burning and stabbing. The pain is at a severity of 7/10. The pain is moderate. The pain is The same all the time. The symptoms are aggravated by sitting. Pertinent negatives include no numbness, pelvic pain or perianal numbness. He has tried chiropractic manipulation and heat for the symptoms. The treatment provided mild relief.    Patient Active Problem List   Diagnosis Date Noted   Idiopathic chronic gout of right ankle without tophus 07/18/2023   Raynaud's disease without gangrene 07/18/2023   Primary hypertension 09/14/2021   Somatic dysfunction of spine, sacral 07/30/2021   Hypermobility syndrome 06/06/2021   Recurrent major depressive disorder, in partial remission 01/28/2017   Hypogonadism in male 01/28/2017   Attention deficit hyperactivity disorder (ADHD), combined type 08/03/2014   Gynecomastia, male - bilateral 04/17/2012       Review of Systems  Genitourinary:  Negative for pelvic pain.  Musculoskeletal:  Positive for back pain.  Neurological:  Negative for numbness.       Objective:   Physical Exam Cardiovascular:     Rate and Rhythm: Normal rate and regular rhythm.     Heart sounds: Normal heart sounds.  Pulmonary:     Breath sounds: Normal breath sounds.  Musculoskeletal:     Comments: Straining to go from sitting to standing Limited ROM of lumbar with pain on flexion and extension Motor strength and sensation distally intact.  Skin:    General: Skin is warm.  Neurological:     General: No focal deficit present.     Mental Status: He is alert and oriented to person, place, and time.  Psychiatric:        Mood and Affect: Mood  normal.        Behavior: Behavior normal.     BP 117/75   Pulse 77   Temp 98.8 F (37.1 C)   Ht 5' 8 (1.727 m)   Wt 193 lb (87.5 kg)   SpO2 98%   BMI 29.35 kg/m        Assessment & Plan:   LEONA PRESSLY in today with chief complaint of Back Pain   1. Acute midline low back pain without sciatica (Primary) Moist heat Rest Back stretches Dont start oral steroids until tomorrow RTO prn - predniSONE  (DELTASONE ) 20 MG tablet; Take 2 tablets (40 mg total) by mouth daily with breakfast for 5 days. 2 po daily for 5 days  Dispense: 10 tablet; Refill: 0 - cyclobenzaprine (FLEXERIL) 10 MG tablet; Take 1 tablet (10 mg total) by mouth 3 (three) times daily as needed for muscle spasms.  Dispense: 30 tablet; Refill: 1 - methylPREDNISolone  acetate (DEPO-MEDROL ) injection 80 mg - ketorolac (TORADOL) injection 60 mg    The above assessment and management plan was discussed with the patient. The patient verbalized understanding of and has agreed to the management plan. Patient is aware to call the clinic if symptoms persist or worsen. Patient is aware when to return to the clinic for a follow-up visit. Patient educated on when it is appropriate to go to the emergency department.   Mary-Margaret Gladis, FNP

## 2024-01-20 ENCOUNTER — Ambulatory Visit: Admitting: Rheumatology

## 2024-01-20 DIAGNOSIS — M1A0710 Idiopathic chronic gout, right ankle and foot, without tophus (tophi): Secondary | ICD-10-CM

## 2024-01-20 DIAGNOSIS — N62 Hypertrophy of breast: Secondary | ICD-10-CM

## 2024-01-20 DIAGNOSIS — Z5181 Encounter for therapeutic drug level monitoring: Secondary | ICD-10-CM

## 2024-01-20 DIAGNOSIS — Z720 Tobacco use: Secondary | ICD-10-CM

## 2024-01-20 DIAGNOSIS — F902 Attention-deficit hyperactivity disorder, combined type: Secondary | ICD-10-CM

## 2024-01-20 DIAGNOSIS — Z8659 Personal history of other mental and behavioral disorders: Secondary | ICD-10-CM

## 2024-01-20 DIAGNOSIS — I7300 Raynaud's syndrome without gangrene: Secondary | ICD-10-CM

## 2024-01-20 DIAGNOSIS — Z6829 Body mass index (BMI) 29.0-29.9, adult: Secondary | ICD-10-CM

## 2024-01-20 DIAGNOSIS — D582 Other hemoglobinopathies: Secondary | ICD-10-CM

## 2024-01-20 DIAGNOSIS — R7989 Other specified abnormal findings of blood chemistry: Secondary | ICD-10-CM

## 2024-01-20 DIAGNOSIS — M249 Joint derangement, unspecified: Secondary | ICD-10-CM

## 2024-01-20 DIAGNOSIS — F3341 Major depressive disorder, recurrent, in partial remission: Secondary | ICD-10-CM

## 2024-01-20 DIAGNOSIS — F1090 Alcohol use, unspecified, uncomplicated: Secondary | ICD-10-CM

## 2024-01-20 DIAGNOSIS — E291 Testicular hypofunction: Secondary | ICD-10-CM

## 2024-01-20 NOTE — Progress Notes (Deleted)
 Office Visit Note  Patient: Charles [name]             Date of Birth: 07/22/1990           MRN: [id number]             PCP: Gladis Mustard, FNP Referring: Gladis Mustard, * Visit Date: 01/27/2024 Occupation: Data Unavailable  Subjective:    History of Present Illness: Charles [name] is a 33 y.o. male with history of gout and raynaud's.  Patient remains on allopurinol  300 mg 1 tablet by mouth daily.   Uric acid 6.9 on 07/18/23.   Activities of Daily Living:  Patient reports morning stiffness for *** {minute/hour:19697}.   Patient {ACTIONS;DENIES/REPORTS:21021675::Denies} nocturnal pain.  Difficulty dressing/grooming: {ACTIONS;DENIES/REPORTS:21021675::Denies} Difficulty climbing stairs: {ACTIONS;DENIES/REPORTS:21021675::Denies} Difficulty getting out of chair: {ACTIONS;DENIES/REPORTS:21021675::Denies} Difficulty using hands for taps, buttons, cutlery, and/or writing: {ACTIONS;DENIES/REPORTS:21021675::Denies}  No Rheumatology ROS completed.   PMFS History:  Patient Active Problem List   Diagnosis Date Noted   Idiopathic chronic gout of right ankle without tophus 07/18/2023   Raynaud's disease without gangrene 07/18/2023   Primary hypertension 09/14/2021   Somatic dysfunction of spine, sacral 07/30/2021   Hypermobility syndrome 06/06/2021   Recurrent major depressive disorder, in partial remission 01/28/2017   Hypogonadism in male 01/28/2017   Attention deficit hyperactivity disorder (ADHD), combined type 08/03/2014   Gynecomastia, male - bilateral 04/17/2012    Past Medical History:  Diagnosis Date   ADD (attention deficit disorder) f   Allergic rhinitis    Bipolar depression (HCC)    Gout    Gynecomastia, male    Hypermobility of joint    Raynauds syndrome     Family History  Problem Relation Age of Onset   Depression Mother    Heart Problems Father    Hypertension Father    Healthy Sister    Cancer Maternal Aunt        breast   Heart  attack Maternal Grandfather    Diabetes Paternal Grandmother    Diabetes Paternal Grandfather    Hyperlipidemia Paternal Grandfather    Hypertension Paternal Grandfather    Heart disease Paternal Grandfather    Past Surgical History:  Procedure Laterality Date   KNEE ARTHROSCOPY W/ MENISCECTOMY  11/23/2005   left knee - extensive bucket handle tear of medial meniscus   SHOULDER ARTHROSCOPY W/ BANKART PROCEDURE Right 08/10/2006   TONSILLECTOMY  09/16/1995   WISDOM TOOTH EXTRACTION     Social History   Tobacco Use   Smoking status: Never    Passive exposure: Past (minimal)   Smokeless tobacco: Former    Types: Engineer, Drilling   Vaping status: Never Used  Substance Use Topics   Alcohol use: Yes    Alcohol/week: 4.0 standard drinks of alcohol    Types: 4 Cans of beer per week    Comment: occ   Drug use: No    Comment: CBC/THC gummy   Social History   Social History Narrative   Not on file     Immunization History  Administered Date(s) Administered   DTaP 02/21/1989, 05/02/1989, 07/11/1989, 06/30/1990, 07/25/1993   HIB (PRP-OMP) 02/21/1989, 05/02/1989, 07/11/1989, 06/30/1990   Hepatitis A 06/17/2005, 12/27/2005   Hepatitis B 07/28/1992, 09/21/1992, 02/07/1993   IPV 02/21/1989, 05/02/1989, 06/30/1990, 07/25/1993   Influenza,Quad,Nasal, Live 12/31/2012, 02/07/2014, 01/22/2018   Influenza,inj,Quad PF,6+ Mos 12/23/2014, 01/28/2017, 01/25/2019   MMR 04/09/1990, 07/25/1993   Meningococcal Conjugate 12/27/2005   Tdap 12/22/2006, 01/14/2023     Objective: Vital Signs: There were  no vitals taken for this visit.   Physical Exam Vitals and nursing note reviewed.  Constitutional:      Appearance: He is well-developed.  HENT:     Head: Normocephalic and atraumatic.  Eyes:     Conjunctiva/sclera: Conjunctivae normal.     Pupils: Pupils are equal, round, and reactive to light.  Cardiovascular:     Rate and Rhythm: Normal rate and regular rhythm.     Heart sounds:  Normal heart sounds.  Pulmonary:     Effort: Pulmonary effort is normal.     Breath sounds: Normal breath sounds.  Abdominal:     General: Bowel sounds are normal.     Palpations: Abdomen is soft.  Musculoskeletal:     Cervical back: Normal range of motion and neck supple.  Skin:    General: Skin is warm and dry.     Capillary Refill: Capillary refill takes less than 2 seconds.  Neurological:     Mental Status: He is alert and oriented to person, place, and time.  Psychiatric:        Behavior: Behavior normal.      Musculoskeletal Exam: ***  CDAI Exam: CDAI Score: -- Patient Global: --; Provider Global: -- Swollen: --; Tender: -- Joint Exam 01/27/2024   No joint exam has been documented for this visit   There is currently no information documented on the homunculus. Go to the Rheumatology activity and complete the homunculus joint exam.  Investigation: No additional findings.  Imaging: No results found.  Recent Labs: Lab Results  Component Value Date   WBC 7.7 07/18/2023   HGB 17.6 (H) 07/18/2023   PLT 307 07/18/2023   NA 137 07/18/2023   K 4.1 07/18/2023   CL 100 07/18/2023   CO2 27 07/18/2023   GLUCOSE 87 07/18/2023   BUN 12 07/18/2023   CREATININE 1.03 07/18/2023   BILITOT 0.7 07/18/2023   ALKPHOS 90 03/27/2023   AST 34 07/18/2023   ALT 69 (H) 07/18/2023   PROT 6.9 07/18/2023   ALBUMIN 4.3 03/27/2023   CALCIUM 9.9 07/18/2023    Speciality Comments: No specialty comments available.  Procedures:  No procedures performed Allergies: Indomethacin , Lamictal [lamotrigine], and Peanut-containing drug products   Assessment / Plan:     Visit Diagnoses: Idiopathic chronic gout of right ankle without tophus  Medication monitoring encounter  Raynaud's disease without gangrene  Hypermobility of joint  Elevated LFTs  Alcohol use  Elevated hemoglobin  History of bulimia  Chews tobacco  Attention deficit hyperactivity disorder (ADHD), combined  type  Gynecomastia, male - bilateral  Hypogonadism in male  Recurrent major depressive disorder, in partial remission  Orders: No orders of the defined types were placed in this encounter.  No orders of the defined types were placed in this encounter.   Face-to-face time spent with patient was *** minutes. Greater than 50% of time was spent in counseling and coordination of care.  Follow-Up Instructions: No follow-ups on file.   Waddell CHRISTELLA Craze, PA-C  Note - This record has been created using Dragon software.  Chart creation errors have been sought, but may not always  have been located. Such creation errors do not reflect on  the standard of medical care.

## 2024-01-27 ENCOUNTER — Ambulatory Visit: Admitting: Physician Assistant

## 2024-01-27 DIAGNOSIS — F1090 Alcohol use, unspecified, uncomplicated: Secondary | ICD-10-CM

## 2024-01-27 DIAGNOSIS — M249 Joint derangement, unspecified: Secondary | ICD-10-CM

## 2024-01-27 DIAGNOSIS — Z5181 Encounter for therapeutic drug level monitoring: Secondary | ICD-10-CM

## 2024-01-27 DIAGNOSIS — R7989 Other specified abnormal findings of blood chemistry: Secondary | ICD-10-CM

## 2024-01-27 DIAGNOSIS — Z720 Tobacco use: Secondary | ICD-10-CM

## 2024-01-27 DIAGNOSIS — N62 Hypertrophy of breast: Secondary | ICD-10-CM

## 2024-01-27 DIAGNOSIS — E291 Testicular hypofunction: Secondary | ICD-10-CM

## 2024-01-27 DIAGNOSIS — Z8659 Personal history of other mental and behavioral disorders: Secondary | ICD-10-CM

## 2024-01-27 DIAGNOSIS — D582 Other hemoglobinopathies: Secondary | ICD-10-CM

## 2024-01-27 DIAGNOSIS — F3341 Major depressive disorder, recurrent, in partial remission: Secondary | ICD-10-CM

## 2024-01-27 DIAGNOSIS — F902 Attention-deficit hyperactivity disorder, combined type: Secondary | ICD-10-CM

## 2024-01-27 DIAGNOSIS — M1A0710 Idiopathic chronic gout, right ankle and foot, without tophus (tophi): Secondary | ICD-10-CM

## 2024-01-27 DIAGNOSIS — I7300 Raynaud's syndrome without gangrene: Secondary | ICD-10-CM

## 2024-02-04 ENCOUNTER — Other Ambulatory Visit: Payer: Self-pay | Admitting: Rheumatology

## 2024-02-04 NOTE — Telephone Encounter (Signed)
 Last Fill: 07/18/2023  Labs: 07/18/2023 CBC shows elevated hemoglobin which is stable.  CMP shows elevated liver function.  Patient stated that he will stop drinking alcohol.  Uric acid is better at 6.9.  He should continue current treatment.   Next Visit: 02/10/2024  Last Visit: 07/18/2023  DX: Idiopathic chronic gout of right ankle without tophus   Current Dose per office note on 07/18/2023: allopurinol  300 mg daily   Okay to refill Allopurinol ?

## 2024-02-10 ENCOUNTER — Encounter: Payer: Self-pay | Admitting: Rheumatology

## 2024-02-10 ENCOUNTER — Ambulatory Visit: Attending: Rheumatology | Admitting: Rheumatology

## 2024-02-10 VITALS — BP 127/81 | HR 85 | Temp 98.2°F | Resp 17 | Ht 68.0 in | Wt 194.0 lb

## 2024-02-10 DIAGNOSIS — M1A0710 Idiopathic chronic gout, right ankle and foot, without tophus (tophi): Secondary | ICD-10-CM | POA: Diagnosis not present

## 2024-02-10 DIAGNOSIS — I7300 Raynaud's syndrome without gangrene: Secondary | ICD-10-CM

## 2024-02-10 DIAGNOSIS — F902 Attention-deficit hyperactivity disorder, combined type: Secondary | ICD-10-CM

## 2024-02-10 DIAGNOSIS — Z8659 Personal history of other mental and behavioral disorders: Secondary | ICD-10-CM

## 2024-02-10 DIAGNOSIS — Z720 Tobacco use: Secondary | ICD-10-CM

## 2024-02-10 DIAGNOSIS — N62 Hypertrophy of breast: Secondary | ICD-10-CM

## 2024-02-10 DIAGNOSIS — Z5181 Encounter for therapeutic drug level monitoring: Secondary | ICD-10-CM

## 2024-02-10 DIAGNOSIS — M249 Joint derangement, unspecified: Secondary | ICD-10-CM

## 2024-02-10 DIAGNOSIS — F3341 Major depressive disorder, recurrent, in partial remission: Secondary | ICD-10-CM

## 2024-02-10 DIAGNOSIS — R7989 Other specified abnormal findings of blood chemistry: Secondary | ICD-10-CM

## 2024-02-10 DIAGNOSIS — D582 Other hemoglobinopathies: Secondary | ICD-10-CM

## 2024-02-10 DIAGNOSIS — E291 Testicular hypofunction: Secondary | ICD-10-CM

## 2024-02-10 DIAGNOSIS — Z6829 Body mass index (BMI) 29.0-29.9, adult: Secondary | ICD-10-CM

## 2024-02-10 DIAGNOSIS — F1090 Alcohol use, unspecified, uncomplicated: Secondary | ICD-10-CM

## 2024-02-10 LAB — COMPREHENSIVE METABOLIC PANEL WITH GFR
AG Ratio: 2.2 (calc) (ref 1.0–2.5)
ALT: 67 U/L — ABNORMAL HIGH (ref 9–46)
AST: 31 U/L (ref 10–40)
Albumin: 4.8 g/dL (ref 3.6–5.1)
Alkaline phosphatase (APISO): 62 U/L (ref 36–130)
BUN: 14 mg/dL (ref 7–25)
CO2: 26 mmol/L (ref 20–32)
Calcium: 9.4 mg/dL (ref 8.6–10.3)
Chloride: 100 mmol/L (ref 98–110)
Creat: 1.05 mg/dL (ref 0.60–1.26)
Globulin: 2.2 g/dL (ref 1.9–3.7)
Glucose, Bld: 87 mg/dL (ref 65–99)
Potassium: 4.3 mmol/L (ref 3.5–5.3)
Sodium: 136 mmol/L (ref 135–146)
Total Bilirubin: 0.6 mg/dL (ref 0.2–1.2)
Total Protein: 7 g/dL (ref 6.1–8.1)
eGFR: 95 mL/min/1.73m2 (ref >=60)

## 2024-02-10 LAB — CBC WITH DIFFERENTIAL/PLATELET
Absolute Lymphocytes: 1881 {cells}/uL (ref 850–3900)
Absolute Monocytes: 594 {cells}/uL (ref 200–950)
Basophils Absolute: 36 {cells}/uL (ref 0–200)
Basophils Relative: 0.4 %
Eosinophils Absolute: 90 {cells}/uL (ref 15–500)
Eosinophils Relative: 1 %
HCT: 48.6 % (ref 39.4–51.1)
Hemoglobin: 16.8 g/dL (ref 13.2–17.1)
MCH: 31.5 pg (ref 27.0–33.0)
MCHC: 34.6 g/dL (ref 31.6–35.4)
MCV: 91 fL (ref 81.4–101.7)
MPV: 10.2 fL (ref 7.5–12.5)
Monocytes Relative: 6.6 %
Neutro Abs: 6399 {cells}/uL (ref 1500–7800)
Neutrophils Relative %: 71.1 %
Platelets: 287 Thousand/uL (ref 140–400)
RBC: 5.34 Million/uL (ref 4.20–5.80)
RDW: 12.9 % (ref 11.0–15.0)
Total Lymphocyte: 20.9 %
WBC: 9 Thousand/uL (ref 3.8–10.8)

## 2024-02-10 LAB — URIC ACID: Uric Acid, Serum: 8 mg/dL (ref 4.0–8.0)

## 2024-02-10 NOTE — Progress Notes (Signed)
 Office Visit Note  Patient: Charles [name]             Date of Birth: [birth year]           MRN: [id number]             PCP: Gladis Mustard, FNP Referring: Gladis Mustard, * Visit Date: 02/10/2024 Occupation: Data Unavailable  Subjective:  Medication management  History of Present Illness: Charles [name] is a 33 y.o. male with history of gout and raynaud's.  Patient remains on allopurinol  300 mg 1 tablet by mouth daily.  He denies having a gout flare since last visit.  He has been taking allopurinol  on a regular basis.  He states he has not had so much red meat in his diet.  However he still continues to drink 2-3 beers per night.  He also quit pouch nicotine use.  He has not had much symptoms of Raynauds so far.  Uric acid 6.9 on 07/18/23.   Activities of Daily Living:  Patient reports morning stiffness for 1-2 minutes.   Patient Denies nocturnal pain.  Difficulty dressing/grooming: Denies Difficulty climbing stairs: Denies Difficulty getting out of chair: Denies Difficulty using hands for taps, buttons, cutlery, and/or writing: Denies  Review of Systems  Constitutional:  Negative for fatigue.  HENT:  Positive for mouth dryness. Negative for mouth sores.   Eyes:  Negative for dryness.  Respiratory:  Negative for shortness of breath.   Cardiovascular:  Negative for chest pain and palpitations.  Gastrointestinal:  Negative for blood in stool, constipation and diarrhea.  Endocrine: Negative for increased urination.  Genitourinary:  Negative for involuntary urination.  Musculoskeletal:  Positive for morning stiffness. Negative for joint pain, gait problem, joint pain, joint swelling, myalgias, muscle weakness, muscle tenderness and myalgias.  Skin:  Positive for rash. Negative for color change, hair loss and sensitivity to sunlight.  Allergic/Immunologic: Negative for susceptible to infections.  Neurological:  Negative for dizziness and headaches.  Hematological:   Negative for swollen glands.  Psychiatric/Behavioral:  Negative for depressed mood and sleep disturbance. The patient is not nervous/anxious.     PMFS History:  Patient Active Problem List   Diagnosis Date Noted   Idiopathic chronic gout of right ankle without tophus 07/18/2023   Raynaud's disease without gangrene 07/18/2023   Primary hypertension 09/14/2021   Somatic dysfunction of spine, sacral 07/30/2021   Hypermobility syndrome 06/06/2021   Recurrent major depressive disorder, in partial remission 01/28/2017   Hypogonadism in male 01/28/2017   Attention deficit hyperactivity disorder (ADHD), combined type 08/03/2014   Gynecomastia, male - bilateral 04/17/2012    Past Medical History:  Diagnosis Date   ADD (attention deficit disorder) f   Allergic rhinitis    Bipolar depression (HCC)    Gout    Gynecomastia, male    Hypermobility of joint    Raynauds syndrome     Family History  Problem Relation Age of Onset   Depression Mother    Heart Problems Father    Hypertension Father    Healthy Sister    Cancer Maternal Aunt        breast   Heart attack Maternal Grandfather    Diabetes Paternal Grandmother    Diabetes Paternal Grandfather    Hyperlipidemia Paternal Grandfather    Hypertension Paternal Grandfather    Heart disease Paternal Grandfather    Past Surgical History:  Procedure Laterality Date   KNEE ARTHROSCOPY W/ MENISCECTOMY  11/23/2005   left knee - extensive bucket handle  tear of medial meniscus   SHOULDER ARTHROSCOPY W/ BANKART PROCEDURE Right 08/10/2006   TONSILLECTOMY  09/16/1995   WISDOM TOOTH EXTRACTION     Social History   Tobacco Use   Smoking status: Never    Passive exposure: Past (minimal)   Smokeless tobacco: Former    Types: Engineer, Drilling   Vaping status: Never Used  Substance Use Topics   Alcohol use: Yes    Alcohol/week: 4.0 standard drinks of alcohol    Types: 4 Cans of beer per week    Comment: occ   Drug use: No    Comment:  CBC/THC gummy   Social History   Social History Narrative   Not on file     Immunization History  Administered Date(s) Administered   DTaP 02/21/1989, 05/02/1989, 07/11/1989, 06/30/1990, 07/25/1993   HIB (PRP-OMP) 02/21/1989, 05/02/1989, 07/11/1989, 06/30/1990   Hepatitis A 06/17/2005, 12/27/2005   Hepatitis B 07/28/1992, 09/21/1992, 02/07/1993   IPV 02/21/1989, 05/02/1989, 06/30/1990, 07/25/1993   Influenza,Quad,Nasal, Live 12/31/2012, 02/07/2014, 01/22/2018   Influenza,inj,Quad PF,6+ Mos 12/23/2014, 01/28/2017, 01/25/2019   MMR 04/09/1990, 07/25/1993   Meningococcal Conjugate 12/27/2005   Tdap 12/22/2006, 01/14/2023     Objective: Vital Signs: BP (!) 143/99   Pulse 83   Temp 98.2 F (36.8 C)   Resp 17   Ht 5' 8 (1.727 m)   Wt 194 lb (88 kg)   BMI 29.50 kg/m    Physical Exam Vitals and nursing note reviewed.  Constitutional:      Appearance: He is well-developed.  HENT:     Head: Normocephalic and atraumatic.  Eyes:     Conjunctiva/sclera: Conjunctivae normal.     Pupils: Pupils are equal, round, and reactive to light.  Cardiovascular:     Rate and Rhythm: Normal rate and regular rhythm.     Heart sounds: Normal heart sounds.  Pulmonary:     Effort: Pulmonary effort is normal.     Breath sounds: Normal breath sounds.  Abdominal:     General: Bowel sounds are normal.     Palpations: Abdomen is soft.  Musculoskeletal:     Cervical back: Normal range of motion and neck supple.  Skin:    General: Skin is warm and dry.     Capillary Refill: Capillary refill takes less than 2 seconds.  Neurological:     Mental Status: He is alert and oriented to person, place, and time.  Psychiatric:        Behavior: Behavior normal.      Musculoskeletal Exam: Cervical, thoracic and lumbar spine were in good range of motion.  There was no SI joint tenderness.  Shoulder joints, elbow joints, wrist joints, MCPs, PIPs and DIPs were in good range of motion with no synovitis.   Hip joints and knee joints were in good range of motion without any warmth swelling or effusion.  There was no tenderness over ankles or MTPs.   CDAI Exam: CDAI Score: -- Patient Global: --; Provider Global: -- Swollen: --; Tender: -- Joint Exam 02/10/2024   No joint exam has been documented for this visit   There is currently no information documented on the homunculus. Go to the Rheumatology activity and complete the homunculus joint exam.  Investigation: No additional findings.  Imaging: No results found.  Recent Labs: Lab Results  Component Value Date   WBC 7.7 07/18/2023   HGB 17.6 (H) 07/18/2023   PLT 307 07/18/2023   NA 137 07/18/2023   K 4.1 07/18/2023  CL 100 07/18/2023   CO2 27 07/18/2023   GLUCOSE 87 07/18/2023   BUN 12 07/18/2023   CREATININE 1.03 07/18/2023   BILITOT 0.7 07/18/2023   ALKPHOS 90 03/27/2023   AST 34 07/18/2023   ALT 69 (H) 07/18/2023   PROT 6.9 07/18/2023   ALBUMIN 4.3 03/27/2023   CALCIUM 9.9 07/18/2023   Jul 18, 2023 uric acid 6.9  Speciality Comments: No specialty comments available.  Procedures:  No procedures performed Allergies: Indomethacin , Lamictal [lamotrigine], and Peanut-containing drug products   Assessment / Plan:     Visit Diagnoses: Idiopathic chronic gout of right ankle without tophus-patient denies having gout flare.  He has been taking allopurinol  300 mg daily without any interruption.  He states he is still more dietary modifications and he also has cut back on alcohol intake.  He has been drinking only 2 to 3 glasses of beer at night.  Abstinence from alcohol was discussed.  Medication monitoring encounter - allopurinol  300 mg daily and colchicine  0.6 mg 1 tablet daily as needed.  Will check labs today.  He has elevated LFTs in the past due to alcohol use.  Uric acid was 6.9 on November 18, 2023.  The goal is to keep uric acid below 6.  Raynaud's disease without gangrene - All autoimmune work-up had been negative.   Patient states the Raynaud's symptoms are not prominent at this time.  He also quit chewing tobacco.  Hypermobility of joint - he has had osteopathic manipulation by Dr. Claudene in the past.  Elevated LFTs - due to alcohol use.  He has decreased alcohol use.  Alcohol use-he drinks only 2 to 3 glass of beer per day now.  Elevated hemoglobin - patient was evaluated by hematology.  I reviewed the note from March 27, 2023.  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  Hypogonadism in male  Recurrent major depressive disorder, in partial remission  BMI 29.0-29.9,adult  Orders: Orders Placed This Encounter  Procedures   CBC with Differential/Platelet   Comprehensive metabolic panel with GFR   Uric acid   No orders of the defined types were placed in this encounter.    Follow-Up Instructions: Return in about 6 months (around 08/10/2024) for Osteoarthritis, Gout.   Maya Nash, MD  Note - This record has been created using Animal nutritionist.  Chart creation errors have been sought, but may not always  have been located. Such creation errors do not reflect on  the standard of medical care.

## 2024-02-10 NOTE — Patient Instructions (Signed)

## 2024-02-11 ENCOUNTER — Ambulatory Visit: Payer: Self-pay | Admitting: Rheumatology

## 2024-02-11 DIAGNOSIS — M1A0710 Idiopathic chronic gout, right ankle and foot, without tophus (tophi): Secondary | ICD-10-CM

## 2024-02-11 DIAGNOSIS — Z5181 Encounter for therapeutic drug level monitoring: Secondary | ICD-10-CM

## 2024-02-11 NOTE — Progress Notes (Signed)
 Liver function remains elevated.  Patient should discontinue alcohol use.  CBC is normal.  Uric acid is elevated at 8.0.  He should take allopurinol  on a regular basis.  Repeat labs in 3 months.  Which should include  CMP, uric acid.  Please forward results to his PCP.

## 2024-03-19 ENCOUNTER — Encounter: Payer: Self-pay | Admitting: Nurse Practitioner

## 2024-03-19 ENCOUNTER — Ambulatory Visit: Payer: Self-pay | Admitting: Nurse Practitioner

## 2024-03-19 VITALS — BP 129/79 | HR 74 | Temp 98.1°F | Ht 68.0 in | Wt 191.0 lb

## 2024-03-19 DIAGNOSIS — I10 Essential (primary) hypertension: Secondary | ICD-10-CM

## 2024-03-19 DIAGNOSIS — F902 Attention-deficit hyperactivity disorder, combined type: Secondary | ICD-10-CM | POA: Diagnosis not present

## 2024-03-19 DIAGNOSIS — F3341 Major depressive disorder, recurrent, in partial remission: Secondary | ICD-10-CM | POA: Diagnosis not present

## 2024-03-19 MED ORDER — AMPHETAMINE-DEXTROAMPHET ER 30 MG PO CP24: 30.0000 mg | ORAL_CAPSULE | ORAL | 0 refills | Status: AC

## 2024-03-19 MED ORDER — FLUOXETINE HCL 40 MG PO CAPS: 40.0000 mg | ORAL_CAPSULE | Freq: Every day | ORAL | 0 refills | Status: AC

## 2024-03-19 MED ORDER — ARIPIPRAZOLE 10 MG PO TABS: 10.0000 mg | ORAL_TABLET | Freq: Every day | ORAL | 1 refills | Status: AC

## 2024-03-19 MED ORDER — LISINOPRIL-HYDROCHLOROTHIAZIDE 20-12.5 MG PO TABS: 1.0000 | ORAL_TABLET | Freq: Every day | ORAL | 1 refills | Status: AC

## 2024-03-19 NOTE — Progress Notes (Signed)
 "  Subjective:    Patient ID: Charles [name], male    DOB: [birth year], 34 y.o.   MRN: [id number]    Chief Complaint: medical management of chronic issues     HPI:  Charles [name] is a 34 y.o. who identifies as a male who was assigned male at birth.   Social history: Lives with: his parents Work history: works at a [employer] in today for follow up of the following chronic medical issues:  1. Attention deficit hyperactivity disorder (ADHD), combined type Is on adderall daily. He has been on adderall off and on since he was in high school. He is not able to concentrate at work without meds.  2. hypertension No c/o chest pain, sob or headache.  BP Readings from Last 3 Encounters:  02/10/24 127/81  01/12/24 117/75  12/05/23 139/85     3. depression Is currently on abilify  daily and is doing well      03/19/2024    4:08 PM 01/12/2024   11:40 AM 12/05/2023    4:04 PM 09/22/2023    4:30 PM  GAD 7 : Generalized Anxiety Score  Nervous, Anxious, on Edge 0 0 1 0  Control/stop worrying 0 0 0 0  Worry too much - different things 0 0 0 0  Trouble relaxing 0 1 0 1  Restless 0 0 0 0  Easily annoyed or irritable 0 0 0 0  Afraid - awful might happen 0 0 0 0  Total GAD 7 Score 0 1 1 1   Anxiety Difficulty Not difficult at all Not difficult at all Not difficult at all Not difficult at all       03/19/2024    4:08 PM 01/12/2024   11:37 AM 12/05/2023    4:04 PM  Depression screen PHQ 2/9  Decreased Interest 1 1 1   Down, Depressed, Hopeless 1 0 1  PHQ - 2 Score 2 1 2   Altered sleeping 1 1 0  Tired, decreased energy 0 1 1  Change in appetite 0 0 0  Feeling bad or failure about yourself  0 1 1  Trouble concentrating 0 0 0  Moving slowly or fidgety/restless 0 0 0  Suicidal thoughts 0 0 0  PHQ-9 Score 3 4  4    Difficult doing work/chores Not difficult at all Not difficult at all Somewhat difficult     Data saved with a previous flowsheet row definition       New  complaints: None today  Allergies  Allergen Reactions   Indomethacin      Brain fog, balance issues, cognitive dissonance.    Lamictal [Lamotrigine] Other (See Comments)    Muscle Spasms in back   Peanut-Containing Drug Products     Specifically Brazilian nuts   Outpatient Encounter Medications as of 03/19/2024  Medication Sig   allopurinol  (ZYLOPRIM ) 300 MG tablet Take 1 tablet by mouth once daily   amphetamine -dextroamphetamine  (ADDERALL XR) 30 MG 24 hr capsule Take 1 capsule (30 mg total) by mouth every morning.   amphetamine -dextroamphetamine  (ADDERALL XR) 30 MG 24 hr capsule Take 1 capsule (30 mg total) by mouth every morning. (Patient not taking: Reported on 02/10/2024)   amphetamine -dextroamphetamine  (ADDERALL XR) 30 MG 24 hr capsule Take 1 capsule (30 mg total) by mouth every morning. (Patient not taking: Reported on 02/10/2024)   ARIPiprazole  (ABILIFY ) 10 MG tablet Take 1 tablet (10 mg total) by mouth daily.   colchicine  0.6 MG tablet TAKE 1 TABLET BY MOUTH  ONCE DAILY, INCREASE TO  TWICE DAILY FOR FLARES   diphenhydrAMINE  (BENADRYL ) 25 MG tablet Take 1 tablet (25 mg total) by mouth every 6 (six) hours as needed for itching.   EPINEPHrine  0.3 mg/0.3 mL IJ SOAJ injection Inject 0.3 mLs (0.3 mg total) into the muscle once as needed for up to 1 dose (Tongue or throat swelling). (Patient not taking: Reported on 02/10/2024)   FLUoxetine  (PROZAC ) 40 MG capsule Take 1 capsule by mouth once daily   fluticasone  (FLONASE ) 50 MCG/ACT nasal spray Place 2 sprays into both nostrils daily. (Patient not taking: Reported on 02/10/2024)   lisinopril -hydrochlorothiazide  (ZESTORETIC ) 20-12.5 MG tablet Take 1 tablet by mouth daily.   naproxen  (NAPROSYN ) 500 MG tablet Take by mouth as needed.   valACYclovir  (VALTREX ) 1000 MG tablet Take 1 tablet (1,000 mg total) by mouth 2 (two) times daily as needed. Prn as needed for fever blisters   No facility-administered encounter medications on file as of 03/19/2024.     Past Surgical History:  Procedure Laterality Date   KNEE ARTHROSCOPY W/ MENISCECTOMY  11/23/2005   left knee - extensive bucket handle tear of medial meniscus   SHOULDER ARTHROSCOPY W/ BANKART PROCEDURE Right 08/10/2006   TONSILLECTOMY  09/16/1995   WISDOM TOOTH EXTRACTION      Family History  Problem Relation Age of Onset   Depression Mother    Heart Problems Father    Hypertension Father    Healthy Sister    Cancer Maternal Aunt        breast   Heart attack Maternal Grandfather    Diabetes Paternal Grandmother    Diabetes Paternal Grandfather    Hyperlipidemia Paternal Grandfather    Hypertension Paternal Grandfather    Heart disease Paternal Grandfather       Controlled substance contract: 02/04/22 drug screen today     Review of Systems  Constitutional:  Negative for diaphoresis.  Eyes:  Negative for pain.  Respiratory:  Negative for shortness of breath.   Cardiovascular:  Negative for chest pain, palpitations and leg swelling.  Gastrointestinal:  Negative for abdominal pain.  Endocrine: Negative for polydipsia.  Skin:  Negative for rash.  Neurological:  Negative for dizziness, weakness and headaches.  Hematological:  Does not bruise/bleed easily.  All other systems reviewed and are negative.      Objective:   Physical Exam Vitals and nursing note reviewed.  Constitutional:      Appearance: Normal appearance. He is well-developed.  Neck:     Thyroid : No thyroid  mass or thyromegaly.     Vascular: No carotid bruit or JVD.     Trachea: Phonation normal.  Cardiovascular:     Rate and Rhythm: Normal rate and regular rhythm.  Pulmonary:     Effort: Pulmonary effort is normal. No respiratory distress.     Breath sounds: Normal breath sounds.  Abdominal:     General: Bowel sounds are normal.     Palpations: Abdomen is soft.     Tenderness: There is no abdominal tenderness.  Musculoskeletal:        General: Normal range of motion.     Cervical back:  Normal range of motion and neck supple.  Lymphadenopathy:     Cervical: No cervical adenopathy.  Skin:    General: Skin is warm and dry.  Neurological:     Mental Status: He is alert and oriented to person, place, and time.  Psychiatric:        Behavior: Behavior normal.  Thought Content: Thought content normal.        Judgment: Judgment normal.     BP 129/79   Pulse 74   Temp 98.1 F (36.7 C) (Temporal)   Ht 5' 8 (1.727 m)   Wt 191 lb (86.6 kg)   SpO2 97%   BMI 29.04 kg/m          Assessment & Plan:  Eva JONETTA Baptist in today with chief complaint of No chief complaint on file.   1. Primary hypertension (Primary) Low sodium die Keep daily of blood pressure at home - lisinopril -hydrochlorothiazide  (ZESTORETIC ) 20-12.5 MG tablet; Take 1 tablet by mouth daily.  Dispense: 90 tablet; Refill: 1  2. Attention deficit hyperactivity disorder (ADHD), combined type Stress management - amphetamine -dextroamphetamine  (ADDERALL XR) 30 MG 24 hr capsule; Take 1 capsule (30 mg total) by mouth every morning.  Dispense: 30 capsule; Refill: 0 - amphetamine -dextroamphetamine  (ADDERALL XR) 30 MG 24 hr capsule; Take 1 capsule (30 mg total) by mouth every morning.  Dispense: 30 capsule; Refill: 0 - amphetamine -dextroamphetamine  (ADDERALL XR) 30 MG 24 hr capsule; Take 1 capsule (30 mg total) by mouth every morning.  Dispense: 30 capsule; Refill: 0  3. Recurrent major depressive disorder, in partial remission (HCC) - ARIPiprazole  (ABILIFY ) 10 MG tablet; Take 1 tablet (10 mg total) by mouth daily.  Dispense: 90 tablet; Refill: 1    The above assessment and management plan was discussed with the patient. The patient verbalized understanding of and has agreed to the management plan. Patient is aware to call the clinic if symptoms persist or worsen. Patient is aware when to return to the clinic for a follow-up visit. Patient educated on when it is appropriate to go to the emergency department.    Mary-Margaret Gladis, FNP   "

## 2024-03-19 NOTE — Patient Instructions (Signed)

## 2024-06-17 ENCOUNTER — Ambulatory Visit: Admitting: Nurse Practitioner

## 2024-08-10 ENCOUNTER — Ambulatory Visit: Admitting: Rheumatology
